# Patient Record
Sex: Female | Born: 1990 | Race: White | Hispanic: No | Marital: Married | Smoking: Never smoker
Health system: Southern US, Community
[De-identification: ages and names within clinical notes are randomized; demographics above are authoritative.]

## PROBLEM LIST (undated history)

## (undated) ENCOUNTER — Inpatient Hospital Stay (HOSPITAL_COMMUNITY): Payer: Self-pay

## (undated) DIAGNOSIS — Z87442 Personal history of urinary calculi: Secondary | ICD-10-CM

## (undated) DIAGNOSIS — R5383 Other fatigue: Secondary | ICD-10-CM

## (undated) DIAGNOSIS — L709 Acne, unspecified: Secondary | ICD-10-CM

## (undated) DIAGNOSIS — R635 Abnormal weight gain: Secondary | ICD-10-CM

## (undated) DIAGNOSIS — Z7689 Persons encountering health services in other specified circumstances: Secondary | ICD-10-CM

## (undated) DIAGNOSIS — R519 Headache, unspecified: Secondary | ICD-10-CM

## (undated) DIAGNOSIS — F419 Anxiety disorder, unspecified: Secondary | ICD-10-CM

## (undated) DIAGNOSIS — N390 Urinary tract infection, site not specified: Secondary | ICD-10-CM

## (undated) HISTORY — DX: Persons encountering health services in other specified circumstances: Z76.89

## (undated) HISTORY — DX: Other fatigue: R53.83

## (undated) HISTORY — DX: Acne, unspecified: L70.9

## (undated) HISTORY — PX: NO PAST SURGERIES: SHX2092

## (undated) HISTORY — DX: Anxiety disorder, unspecified: F41.9

## (undated) HISTORY — DX: Abnormal weight gain: R63.5

---

## 2013-04-10 ENCOUNTER — Ambulatory Visit (INDEPENDENT_AMBULATORY_CARE_PROVIDER_SITE_OTHER): Payer: 59 | Admitting: Adult Health

## 2013-04-10 ENCOUNTER — Encounter: Payer: Self-pay | Admitting: Adult Health

## 2013-04-10 ENCOUNTER — Encounter (INDEPENDENT_AMBULATORY_CARE_PROVIDER_SITE_OTHER): Payer: Self-pay

## 2013-04-10 VITALS — BP 100/60 | Ht 63.0 in | Wt 111.0 lb

## 2013-04-10 DIAGNOSIS — Z3202 Encounter for pregnancy test, result negative: Secondary | ICD-10-CM

## 2013-04-10 DIAGNOSIS — Z7689 Persons encountering health services in other specified circumstances: Secondary | ICD-10-CM

## 2013-04-10 DIAGNOSIS — Z3200 Encounter for pregnancy test, result unknown: Secondary | ICD-10-CM

## 2013-04-10 HISTORY — DX: Persons encountering health services in other specified circumstances: Z76.89

## 2013-04-10 LAB — POCT URINE PREGNANCY: Preg Test, Ur: NEGATIVE

## 2013-04-10 MED ORDER — NORETHIN ACE-ETH ESTRAD-FE 1-20 MG-MCG(24) PO CHEW: 1.0000 | CHEWABLE_TABLET | Freq: Every day | ORAL | Status: DC

## 2013-04-10 NOTE — Progress Notes (Signed)
Subjective:     Patient ID: Zoe Armstrong, female   DOB: 01-19-1991, 22 y.o.   MRN: [id number]  HPI Zoe Armstrong is a 22 year old white female single,senior at [employer], here to get on birth control pills to help with acne, but also to control periods.She bleeds up to 7 days with the first 3-4 heavy,changing pads every 2 hours,and has headaches with her periods.She started at about age 22, she has never had sex, and does not use tampons.She said she has migraines without aura, but takes no meds at present.  Review of Systems Positives in HPI Reviewed past medical,surgical, social and family history. Reviewed medications and allergies.     Objective:   Physical Exam BP 100/60  Ht 5\' 3"  (1.6 m)  Wt 111 lb (50.349 kg)  BMI 19.67 kg/m2  LMP 03/30/2013   Urine pregnancy test negative,  Skin warm and dry. Neck: mid line trachea, normal thyroid. Lungs: clear to ausculation bilaterally. Cardiovascular: regular rate and rhythm. Assessment:     Period management    Plan:    Will start minastrin with next period, Number of samples 4  Lot number [id number]    Exp date 2/16 Follow up in 3 months Review handout on contraception Call prn problems or use my chart to communicate

## 2013-04-10 NOTE — Patient Instructions (Signed)
Contraception Choices  Contraception (birth control) is the use of any methods or devices to prevent pregnancy. Below are some methods to help avoid pregnancy.  HORMONAL METHODS   · Contraceptive implant. This is a thin, plastic tube containing progesterone hormone. It does not contain estrogen hormone. Your caregiver inserts the tube in the inner part of the upper arm. The tube can remain in place for up to 3 years. After 3 years, the implant must be removed. The implant prevents the ovaries from releasing an egg (ovulation), thickens the cervical mucus which prevents sperm from entering the uterus, and thins the lining of the inside of the uterus.  · Progesterone-only injections. These injections are given every 3 months by your caregiver to prevent pregnancy. This synthetic progesterone hormone stops the ovaries from releasing eggs. It also thickens cervical mucus and changes the uterine lining. This makes it harder for sperm to survive in the uterus.  · Birth control pills. These pills contain estrogen and progesterone hormone. They work by stopping the egg from forming in the ovary (ovulation). Birth control pills are prescribed by a caregiver. Birth control pills can also be used to treat heavy periods.  · Minipill. This type of birth control pill contains only the progesterone hormone. They are taken every day of each month and must be prescribed by your caregiver.  · Birth control patch. The patch contains hormones similar to those in birth control pills. It must be changed once a week and is prescribed by a caregiver.  · Vaginal ring. The ring contains hormones similar to those in birth control pills. It is left in the vagina for 3 weeks, removed for 1 week, and then a new one is put back in place. The patient must be comfortable inserting and removing the ring from the vagina. A caregiver's prescription is necessary.  · Emergency contraception. Emergency contraceptives prevent pregnancy after unprotected  sexual intercourse. This pill can be taken right after sex or up to 5 days after unprotected sex. It is most effective the sooner you take the pills after having sexual intercourse. Emergency contraceptive pills are available without a prescription. Check with your pharmacist. Do not use emergency contraception as your only form of birth control.  BARRIER METHODS   · Female condom. This is a thin sheath (latex or rubber) that is worn over the penis during sexual intercourse. It can be used with spermicide to increase effectiveness.  · Female condom. This is a soft, loose-fitting sheath that is put into the vagina before sexual intercourse.  · Diaphragm. This is a soft, latex, dome-shaped barrier that must be fitted by a caregiver. It is inserted into the vagina, along with a spermicidal jelly. It is inserted before intercourse. The diaphragm should be left in the vagina for 6 to 8 hours after intercourse.  · Cervical cap. This is a round, soft, latex or plastic cup that fits over the cervix and must be fitted by a caregiver. The cap can be left in place for up to 48 hours after intercourse.  · Sponge. This is a soft, circular piece of polyurethane foam. The sponge has spermicide in it. It is inserted into the vagina after wetting it and before sexual intercourse.  · Spermicides. These are chemicals that kill or block sperm from entering the cervix and uterus. They come in the form of creams, jellies, suppositories, foam, or tablets. They do not require a prescription. They are inserted into the vagina with an applicator before having sexual intercourse.   IUD). This is a T-shaped device that is put in a woman's uterus during a menstrual period to prevent pregnancy. There are 2 types:  Copper IUD. This type of IUD is wrapped in copper wire and is placed inside the uterus. Copper makes the uterus and  fallopian tubes produce a fluid that kills sperm. It can stay in place for 10 years.  Hormone IUD. This type of IUD contains the hormone progestin (synthetic progesterone). The hormone thickens the cervical mucus and prevents sperm from entering the uterus, and it also thins the uterine lining to prevent implantation of a fertilized egg. The hormone can weaken or kill the sperm that get into the uterus. It can stay in place for 5 years. PERMANENT METHODS OF CONTRACEPTION  Female tubal ligation. This is when the woman's fallopian tubes are surgically sealed, tied, or blocked to prevent the egg from traveling to the uterus.  Female sterilization. This is when the female has the tubes that carry sperm tied off (vasectomy).This blocks sperm from entering the vagina during sexual intercourse. After the procedure, the man can still ejaculate fluid (semen). NATURAL PLANNING METHODS  Natural family planning. This is not having sexual intercourse or using a barrier method (condom, diaphragm, cervical cap) on days the woman could become pregnant.  Calendar method. This is keeping track of the length of each menstrual cycle and identifying when you are fertile.  Ovulation method. This is avoiding sexual intercourse during ovulation.  Symptothermal method. This is avoiding sexual intercourse during ovulation, using a thermometer and ovulation symptoms.  Post-ovulation method. This is timing sexual intercourse after you have ovulated. Regardless of which type or method of contraception you choose, it is important that you use condoms to protect against the transmission of sexually transmitted diseases (STDs). Talk with your caregiver about which form of contraception is most appropriate for you. Document Released: 06/15/2005 Document Revised: 09/07/2011 Document Reviewed: 10/22/2010 [facility] Patient Information 2014 [place], [place]. Start minastrin with next period Follow up in 3 months

## 2013-07-11 ENCOUNTER — Ambulatory Visit (INDEPENDENT_AMBULATORY_CARE_PROVIDER_SITE_OTHER): Payer: 59 | Admitting: Adult Health

## 2013-07-11 ENCOUNTER — Encounter (INDEPENDENT_AMBULATORY_CARE_PROVIDER_SITE_OTHER): Payer: Self-pay

## 2013-07-11 ENCOUNTER — Encounter: Payer: Self-pay | Admitting: Adult Health

## 2013-07-11 VITALS — BP 100/56 | Ht 63.0 in | Wt 121.0 lb

## 2013-07-11 DIAGNOSIS — Z7689 Persons encountering health services in other specified circumstances: Secondary | ICD-10-CM

## 2013-07-11 MED ORDER — NORETHIN ACE-ETH ESTRAD-FE 1-20 MG-MCG(24) PO CHEW: 1.0000 | CHEWABLE_TABLET | Freq: Every day | ORAL | Status: DC

## 2013-07-11 NOTE — Patient Instructions (Signed)
Continue OCs Follow up in 1 year 

## 2013-07-11 NOTE — Progress Notes (Signed)
Subjective:     Patient ID: Zoe Armstrong, female   DOB: 11/19/1990, 23 y.o.   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  Review of Systems See HPI Reviewed past medical,surgical, social and family history. Reviewed medications and allergies.     Objective:   Physical Exam BP 100/56  Ht 5\' 3"  (1.6 m)  Wt 121 lb (54.885 kg)  BMI 21.44 kg/m2  LMP 07/03/2013   Discussed the pill and how she is doing and she wants to continue the Minastrin, if acne no better see dermatologist Assessment:     Period management    Plan:     Refilled minastrin x 1 year Follow up in 1 year  Call prn questions or problems

## 2013-11-07 ENCOUNTER — Telehealth: Payer: Self-pay | Admitting: *Deleted

## 2013-11-07 MED ORDER — NORETHIN ACE-ETH ESTRAD-FE 1-20 MG-MCG(24) PO CHEW: 1.0000 | CHEWABLE_TABLET | Freq: Every day | ORAL | Status: DC

## 2013-11-07 NOTE — Telephone Encounter (Signed)
Zoe Armstrong  Gave 1 sample of minastrin to pt

## 2013-11-07 NOTE — Telephone Encounter (Signed)
Pt states went to pick up her refill for OCP and was told cost $100.00 or she could get filled thru mail order cheaper. Pt states has no OCP at all. Per Cyril MourningJennifer Griffin, NP ok to give 1 box sample of Minastrin 24 Fe until pt can get RX filled through mail order. Pt encouraged to have [facility] transfer script for mail order due to limited samples of OCP. Pt verbalized understanding.

## 2013-11-09 ENCOUNTER — Telehealth: Payer: Self-pay | Admitting: Adult Health

## 2013-11-09 MED ORDER — NORETHIN ACE-ETH ESTRAD-FE 1-20 MG-MCG(24) PO CHEW: 1.0000 | CHEWABLE_TABLET | Freq: Every day | ORAL | Status: DC

## 2013-11-09 NOTE — Telephone Encounter (Signed)
Pt requesting a refill for Minastrin Fe with refills to [facility], no longer able to get filled at [facility] due to cost.

## 2013-11-09 NOTE — Telephone Encounter (Signed)
rx sent to [facility]

## 2014-02-23 ENCOUNTER — Ambulatory Visit: Payer: 59 | Admitting: Adult Health

## 2014-02-28 ENCOUNTER — Encounter: Payer: Self-pay | Admitting: Adult Health

## 2014-02-28 ENCOUNTER — Ambulatory Visit (INDEPENDENT_AMBULATORY_CARE_PROVIDER_SITE_OTHER): Payer: 59 | Admitting: Adult Health

## 2014-02-28 VITALS — BP 120/90 | Ht 63.0 in | Wt 117.0 lb

## 2014-02-28 DIAGNOSIS — Z7689 Persons encountering health services in other specified circumstances: Secondary | ICD-10-CM

## 2014-02-28 LAB — POCT URINE PREGNANCY: PREG TEST UR: NEGATIVE

## 2014-02-28 MED ORDER — NORETHIN ACE-ETH ESTRAD-FE 1-20 MG-MCG(24) PO CHEW: 1.0000 | CHEWABLE_TABLET | Freq: Every day | ORAL | Status: DC

## 2014-02-28 NOTE — Patient Instructions (Signed)
Continue OC Follow up in 1 year

## 2014-02-28 NOTE — Progress Notes (Signed)
Subjective:     Patient ID: Zoe Armstrong, female   DOB: 03/21/1991, 23 y.o.   MRN: [id number]  HPI Zuriah is a 23 year old white female in to discuss OC.[facility]  Review of Systems See HPI Reviewed past medical,surgical, social and family history. Reviewed medications and allergies.     Objective:   Physical Exam BP 120/90  Ht  (1.6 m)  Wt 117 lb (53.071 kg)  BMI 20.73 kg/m2  LMP 08/06/2015UPT negative,she missed pills up in July nad  Last period lasted 2 weeks, will continue this pill    Assessment:     Period management    Plan:     Refilled mionastrin x 1 year  Return in 1 year for physical

## 2014-07-25 ENCOUNTER — Encounter: Payer: Self-pay | Admitting: Adult Health

## 2014-07-25 ENCOUNTER — Ambulatory Visit (INDEPENDENT_AMBULATORY_CARE_PROVIDER_SITE_OTHER): Payer: BC Managed Care – PPO | Admitting: Adult Health

## 2014-07-25 VITALS — BP 108/78 | Ht 63.0 in | Wt 119.0 lb

## 2014-07-25 DIAGNOSIS — F419 Anxiety disorder, unspecified: Secondary | ICD-10-CM

## 2014-07-25 HISTORY — DX: Anxiety disorder, unspecified: F41.9

## 2014-07-25 MED ORDER — SERTRALINE HCL 25 MG PO TABS: 25.0000 mg | ORAL_TABLET | Freq: Every day | ORAL | Status: DC

## 2014-07-25 NOTE — Patient Instructions (Signed)
Generalized Anxiety Disorder Generalized anxiety disorder (GAD) is a mental disorder. It interferes with life functions, including relationships, work, and school. GAD is different from normal anxiety, which everyone experiences at some point in their lives in response to specific life events and activities. Normal anxiety actually helps us prepare for and get through these life events and activities. Normal anxiety goes away after the event or activity is over.  GAD causes anxiety that is not necessarily related to specific events or activities. It also causes excess anxiety in proportion to specific events or activities. The anxiety associated with GAD is also difficult to control. GAD can vary from mild to severe. People with severe GAD can have intense waves of anxiety with physical symptoms (panic attacks).  SYMPTOMS The anxiety and worry associated with GAD are difficult to control. This anxiety and worry are related to many life events and activities and also occur more days than not for 6 months or longer. People with GAD also have three or more of the following symptoms (one or more in children):  Restlessness.   Fatigue.  Difficulty concentrating.   Irritability.  Muscle tension.  Difficulty sleeping or unsatisfying sleep. DIAGNOSIS GAD is diagnosed through an assessment by your health care provider. Your health care provider will ask you questions aboutyour mood,physical symptoms, and events in your life. Your health care provider may ask you about your medical history and use of alcohol or drugs, including prescription medicines. Your health care provider may also do a physical exam and blood tests. Certain medical conditions and the use of certain substances can cause symptoms similar to those associated with GAD. Your health care provider may refer you to a mental health specialist for further evaluation. TREATMENT The following therapies are usually used to treat GAD:    Medication. Antidepressant medication usually is prescribed for long-term daily control. Antianxiety medicines may be added in severe cases, especially when panic attacks occur.   Talk therapy (psychotherapy). Certain types of talk therapy can be helpful in treating GAD by providing support, education, and guidance. A form of talk therapy called cognitive behavioral therapy can teach you healthy ways to think about and react to daily life events and activities.  Stress managementtechniques. These include yoga, meditation, and exercise and can be very helpful when they are practiced regularly. A mental health specialist can help determine which treatment is best for you. Some people see improvement with one therapy. However, other people require a combination of therapies. Document Released: 10/10/2012 Document Revised: 10/30/2013 Document Reviewed: 10/10/2012 [facility] Patient Information 2015 [place], [place]. This information is not intended to replace advice given to you by your health care provider. Make sure you discuss any questions you have with your health care provider. Take zoloft 1 daily Return in 4 weeks to see me

## 2014-07-25 NOTE — Progress Notes (Signed)
Subjective:     Patient ID: Zoe Armstrong, female   DOB: 12-Oct-1990, 24 y.o.   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  Review of Systems See HPI Reviewed past medical,surgical, social and family history. Reviewed medications and allergies.     Objective:   Physical Exam BP 108/78 mmHg  Ht 5\' 3"  (1.6 m)  Wt 119 lb (53.978 kg)  BMI 21.09 kg/m2  LMP 07/12/2014   Discussed trying medication and she agrees.And talked about taking time for self.  Assessment:    Anxiety    Plan:     Rx Zoloft 25 mg #30 1 daily with 3 refills Return in 4 weeks, if not better can increase Zoloft to 50 mg Review handout on anxiety

## 2014-08-22 ENCOUNTER — Ambulatory Visit (INDEPENDENT_AMBULATORY_CARE_PROVIDER_SITE_OTHER): Payer: BC Managed Care – PPO | Admitting: Adult Health

## 2014-08-22 ENCOUNTER — Encounter: Payer: Self-pay | Admitting: Adult Health

## 2014-08-22 VITALS — BP 102/62 | HR 65 | Ht 63.0 in | Wt 118.0 lb

## 2014-08-22 DIAGNOSIS — F419 Anxiety disorder, unspecified: Secondary | ICD-10-CM

## 2014-08-22 MED ORDER — SERTRALINE HCL 25 MG PO TABS: 25.0000 mg | ORAL_TABLET | Freq: Every day | ORAL | Status: DC

## 2014-08-22 NOTE — Patient Instructions (Signed)
Continue zoloft  Follow up in 6 months

## 2014-08-22 NOTE — Progress Notes (Signed)
Subjective:     Patient ID: Zoe Armstrong, female   DOB: [birth year], 24 y.o.   MRN: [id number]  HPI Zoe Armstrong is a 24 year old white female back in for follow up of starting zoloft for anxiety and she says she is better.Not crying as much.  Review of Systems  + anxiety,but it is better  Reviewed past medical,surgical, social and family history. Reviewed medications and allergies.     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    Assessment:     Anxiety     Plan:     Refilled zoloft 50 mg #30 1 daily with 6 refills Follow up in 6 months Call before if needed

## 2014-09-11 ENCOUNTER — Telehealth: Payer: Self-pay | Admitting: Adult Health

## 2014-09-11 MED ORDER — NORETHIN ACE-ETH ESTRAD-FE 1-20 MG-MCG(24) PO CHEW: 1.0000 | CHEWABLE_TABLET | Freq: Every day | ORAL | Status: DC

## 2014-09-11 NOTE — Telephone Encounter (Signed)
Refilled minastrin x 6

## 2014-09-18 ENCOUNTER — Telehealth: Payer: Self-pay | Admitting: Adult Health

## 2014-09-18 MED ORDER — NORETHIN ACE-ETH ESTRAD-FE 1-20 MG-MCG(24) PO CHEW: 1.0000 | CHEWABLE_TABLET | Freq: Every day | ORAL | Status: DC

## 2014-09-18 NOTE — Telephone Encounter (Signed)
Needs refill on OCs [facility]

## 2014-12-20 ENCOUNTER — Other Ambulatory Visit: Payer: Self-pay | Admitting: *Deleted

## 2014-12-20 MED ORDER — SERTRALINE HCL 25 MG PO TABS: 25.0000 mg | ORAL_TABLET | Freq: Every day | ORAL | Status: DC

## 2014-12-20 MED ORDER — NORETHIN ACE-ETH ESTRAD-FE 1-20 MG-MCG(24) PO CHEW: 1.0000 | CHEWABLE_TABLET | Freq: Every day | ORAL | Status: DC

## 2014-12-25 ENCOUNTER — Telehealth: Payer: Self-pay | Admitting: Adult Health

## 2014-12-25 NOTE — Telephone Encounter (Signed)
Spoke with pt. Pt states her Zoloft requires prior auth. I advised to have pharmacy send us the paper with the phone number that we need to call. Pt voiced understanding. JSY

## 2015-09-04 ENCOUNTER — Telehealth: Payer: Self-pay | Admitting: *Deleted

## 2015-09-05 ENCOUNTER — Telehealth: Payer: Self-pay | Admitting: Adult Health

## 2015-09-05 MED ORDER — SERTRALINE HCL 25 MG PO TABS: 25.0000 mg | ORAL_TABLET | Freq: Every day | ORAL | Status: DC

## 2015-09-05 NOTE — Telephone Encounter (Signed)
Refilled zoloft

## 2015-09-05 NOTE — Telephone Encounter (Signed)
Left message to call me back in am  

## 2015-09-05 NOTE — Telephone Encounter (Signed)
Spoke with pt. Pt has switched pharmacies and needs birth control and Sertraline, 90 day supply, sent to new pharmacy. Thanks!! JSY

## 2015-09-05 NOTE — Telephone Encounter (Signed)
Pt wanted to let Zoe Armstrong now that she has switched pharmacy, please contact pt

## 2015-09-06 MED ORDER — NORETHIN ACE-ETH ESTRAD-FE 1-20 MG-MCG(24) PO CHEW: 1.0000 | CHEWABLE_TABLET | Freq: Every day | ORAL | Status: DC

## 2015-09-06 NOTE — Addendum Note (Signed)
Addended by: Cyril MourningGRIFFIN, JENNIFER A on: 09/06/2015 01:28 PM   Modules accepted: Orders

## 2015-09-06 NOTE — Telephone Encounter (Signed)
Refilled zoloft and C OCs

## 2015-12-19 ENCOUNTER — Other Ambulatory Visit: Payer: Self-pay | Admitting: *Deleted

## 2015-12-19 NOTE — Telephone Encounter (Signed)
[phone] voice mail not set up @ 12:41 pm. JSY

## 2015-12-20 MED ORDER — NORETHIN ACE-ETH ESTRAD-FE 1-20 MG-MCG(24) PO CHEW: 1.0000 | CHEWABLE_TABLET | Freq: Every day | ORAL | Status: DC

## 2015-12-24 ENCOUNTER — Other Ambulatory Visit (HOSPITAL_COMMUNITY)
Admission: RE | Admit: 2015-12-24 | Discharge: 2015-12-24 | Disposition: A | Discharge status: Home / Self Care | Payer: BC Managed Care – PPO | Source: Ambulatory Visit | Attending: Obstetrics & Gynecology | Admitting: Obstetrics & Gynecology

## 2015-12-24 ENCOUNTER — Ambulatory Visit (INDEPENDENT_AMBULATORY_CARE_PROVIDER_SITE_OTHER): Payer: BC Managed Care – PPO | Admitting: Adult Health

## 2015-12-24 ENCOUNTER — Encounter: Payer: Self-pay | Admitting: Adult Health

## 2015-12-24 VITALS — BP 100/70 | HR 68 | Ht 63.0 in | Wt 134.5 lb

## 2015-12-24 DIAGNOSIS — Z01419 Encounter for gynecological examination (general) (routine) without abnormal findings: Secondary | ICD-10-CM

## 2015-12-24 DIAGNOSIS — Z01411 Encounter for gynecological examination (general) (routine) with abnormal findings: Secondary | ICD-10-CM | POA: Diagnosis not present

## 2015-12-24 DIAGNOSIS — Z7689 Persons encountering health services in other specified circumstances: Secondary | ICD-10-CM

## 2015-12-24 DIAGNOSIS — F419 Anxiety disorder, unspecified: Secondary | ICD-10-CM

## 2015-12-24 DIAGNOSIS — R635 Abnormal weight gain: Secondary | ICD-10-CM | POA: Diagnosis not present

## 2015-12-24 DIAGNOSIS — Z308 Encounter for other contraceptive management: Secondary | ICD-10-CM

## 2015-12-24 DIAGNOSIS — R5383 Other fatigue: Secondary | ICD-10-CM

## 2015-12-24 HISTORY — DX: Other fatigue: R53.83

## 2015-12-24 HISTORY — DX: Abnormal weight gain: R63.5

## 2015-12-24 HISTORY — DX: Persons encountering health services in other specified circumstances: Z76.89

## 2015-12-24 MED ORDER — NORETHIN ACE-ETH ESTRAD-FE 1-20 MG-MCG(24) PO CHEW: 1.0000 | CHEWABLE_TABLET | Freq: Every day | ORAL | Status: DC

## 2015-12-24 MED ORDER — SERTRALINE HCL 25 MG PO TABS: 25.0000 mg | ORAL_TABLET | Freq: Every day | ORAL | Status: DC

## 2015-12-24 NOTE — Patient Instructions (Signed)
Physical in 1 year,pap in 3 if normal 

## 2015-12-24 NOTE — Progress Notes (Signed)
Patient ID: Zoe Armstrong, female   DOB: 03/05/1991, 25 y.o.   MRN: [id number] History of Present Illness:  Zoe Armstrong is a 25 year old white female,single,in for a well woman gyn exam and pap.She has had some irregular periods with generic minastrin, and she is complaining of fatigue and weight gain. 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  Current Medications, Allergies, Past Medical History, Past Surgical History, Family History and Social History were reviewed in [facility] electronic medical record.     Review of Systems:  Patient denies any headaches, hearing loss, blurred vision, shortness of breath, chest pain, abdominal pain, problems with bowel movements, urination, or intercourse(not currently having sex). No joint pain or mood swings. See HPI for positives.  Physical Exam:BP 100/70 mmHg  Pulse 68  Ht 5\' 3"  (1.6 m)  Wt 134 lb 8 oz (61.009 kg)  BMI 23.83 kg/m2  LMP 12/20/2015 General:  Well developed, well nourished, no acute distress Skin:  Warm and dry,tan Neck:  Midline trachea, normal thyroid, good ROM, no lymphadenopathy Lungs; Clear to auscultation bilaterally Breast:  No dominant palpable mass, retraction, or nipple discharge Cardiovascular: Regular rate and rhythm Abdomen:  Soft, non tender, no hepatosplenomegaly Pelvic:  External genitalia is normal in appearance, has 1 cm dark mole in mons pubis(I told her to see Dr Orvan FalconerBeavers to have removed).  The vagina is normal in appearance.Has dark brown blood in vault. Urethra has no lesions or masses. The cervix is smooth,pap performed.  Uterus is felt to be normal size, shape, and contour.  No adnexal masses or tenderness noted.Bladder is non tender, no masses felt. Extremities/musculoskeletal:  No swelling or varicosities noted, no clubbing or cyanosis Psych:  No mood changes, alert and cooperative,seems happy   Impression:  Well woman gyn exam and pap Anxiety Period  management Fatigue    Plan: Check CBC,CMP,TSH and free T4,will talk when labs back  Physical in 1 year, pap in 3 if normal Refilled minastrin x 1 year Refilled zoloft 25 mg #90 take 1 daily with 4 refills

## 2015-12-25 ENCOUNTER — Telehealth: Payer: Self-pay | Admitting: Adult Health

## 2015-12-25 LAB — COMPREHENSIVE METABOLIC PANEL
ALK PHOS: 46 IU/L (ref 39–117)
ALT: 13 IU/L (ref 0–32)
AST: 16 IU/L (ref 0–40)
Albumin/Globulin Ratio: 1.8 (ref 1.2–2.2)
Albumin: 4.5 g/dL (ref 3.5–5.5)
BUN/Creatinine Ratio: 21 (ref 9–23)
BUN: 13 mg/dL (ref 6–20)
Bilirubin Total: 0.2 mg/dL (ref 0.0–1.2)
CO2: 22 mmol/L (ref 18–29)
CREATININE: 0.63 mg/dL (ref 0.57–1.00)
Calcium: 9.4 mg/dL (ref 8.7–10.2)
Chloride: 103 mmol/L (ref 96–106)
GFR calc Af Amer: 143 mL/min/{1.73_m2} (ref >59)
GFR, EST NON AFRICAN AMERICAN: 124 mL/min/{1.73_m2} (ref >59)
GLOBULIN, TOTAL: 2.5 g/dL (ref 1.5–4.5)
Glucose: 55 mg/dL — ABNORMAL LOW (ref 65–99)
POTASSIUM: 4.2 mmol/L (ref 3.5–5.2)
Sodium: 140 mmol/L (ref 134–144)
Total Protein: 7 g/dL (ref 6.0–8.5)

## 2015-12-25 LAB — CBC
Hematocrit: 40.6 % (ref 34.0–46.6)
Hemoglobin: 13.4 g/dL (ref 11.1–15.9)
MCH: 28.3 pg (ref 26.6–33.0)
MCHC: 33 g/dL (ref 31.5–35.7)
MCV: 86 fL (ref 79–97)
Platelets: 309 10*3/uL (ref 150–379)
RBC: 4.73 x10E6/uL (ref 3.77–5.28)
RDW: 13.7 % (ref 12.3–15.4)
WBC: 6.8 10*3/uL (ref 3.4–10.8)

## 2015-12-25 LAB — T4, FREE: Free T4: 1.18 ng/dL (ref 0.82–1.77)

## 2015-12-25 LAB — TSH: TSH: 2.59 u[IU]/mL (ref 0.450–4.500)

## 2015-12-25 NOTE — Telephone Encounter (Signed)
Pt aware of labs, try to eat every 2-3 hours with some protein

## 2015-12-26 LAB — CYTOLOGY - PAP

## 2016-12-28 ENCOUNTER — Encounter: Payer: Self-pay | Admitting: Adult Health

## 2016-12-28 ENCOUNTER — Ambulatory Visit (INDEPENDENT_AMBULATORY_CARE_PROVIDER_SITE_OTHER): Payer: BC Managed Care – PPO | Admitting: Adult Health

## 2016-12-28 VITALS — BP 112/72 | HR 76 | Ht 63.0 in | Wt 135.5 lb

## 2016-12-28 DIAGNOSIS — Z7689 Persons encountering health services in other specified circumstances: Secondary | ICD-10-CM

## 2016-12-28 DIAGNOSIS — Z01419 Encounter for gynecological examination (general) (routine) without abnormal findings: Secondary | ICD-10-CM

## 2016-12-28 MED ORDER — NORETHIN ACE-ETH ESTRAD-FE 1-20 MG-MCG(24) PO CHEW: 1.0000 | CHEWABLE_TABLET | Freq: Every day | ORAL | 12 refills | Status: DC

## 2016-12-28 NOTE — Progress Notes (Signed)
Patient ID: Zoe Armstrong, female   DOB: 05-Aug-1990, 26 y.o.   MRN: [id number] History of Present Illness: [name] is a 26 year old white female, G0P0, getting married in May, in for well woman gyn exam, she had normal pap 12/24/15.She weaned off her Zoloft and is fine.She says periods good on OCs.    Current Medications, Allergies, Past Medical History, Past Surgical History, Family History and Social History were reviewed in [facility] electronic medical record.     Review of Systems:  Patient denies any headaches, hearing loss, fatigue, blurred vision, shortness of breath, chest pain, abdominal pain, problems with bowel movements, urination, or intercourse(not having sex). No joint pain or mood swings.Periods good on OCs.   Physical Exam:BP 112/72 (BP Location: Left Arm, Patient Position: Sitting, Cuff Size: Normal)   Pulse 76   Ht 5\' 3"  (1.6 m)   Wt 135 lb 8 oz (61.5 kg)   LMP 12/18/2016 (Exact Date)   BMI 24.00 kg/m  General:  Well developed, well nourished, no acute distress Skin:  Warm and dry,tan, has mole on back that is dark Neck:  Midline trachea, normal thyroid, good ROM, no lymphadenopathy Lungs; Clear to auscultation bilaterally Breast:  No dominant palpable mass, retraction, or nipple discharge Cardiovascular: Regular rate and rhythm Abdomen:  Soft, non tender, no hepatosplenomegaly Pelvic:  External genitalia is normal in appearance, has mole on right side mons pubis, that is slightly elevated and irregular in color, and bigger than pencil eraser.  The vagina is normal in appearance. Urethra has no lesions or masses. The cervix is nulliparous.  Uterus is felt to be normal size, shape, and contour.  No adnexal masses or tenderness noted.Bladder is non tender, no masses felt. Extremities/musculoskeletal:  No swelling or varicosities noted, no clubbing or cyanosis Psych:  No mood changes, alert and cooperative,seems happy PHQ 2 score 0.  Impression: 1. Well woman exam with  routine gynecological exam   2. Encounter for menstrual regulation       Plan: Meds ordered this encounter  Medications  . Norethin Ace-Eth Estrad-FE (MINASTRIN 24 FE) 1-20 MG-MCG(24) CHEW    Sig: Chew 1 tablet by mouth daily.    Dispense:  30 tablet    Refill:  12    Order Specific Question:   Supervising Provider    Answer:   Lazaro ArmsEURE, LUTHER H [[id number]]  Physical in 1 year Pap in 2020 See dermatologist about moles

## 2017-03-24 ENCOUNTER — Encounter: Payer: Self-pay | Admitting: Adult Health

## 2017-03-24 ENCOUNTER — Ambulatory Visit (INDEPENDENT_AMBULATORY_CARE_PROVIDER_SITE_OTHER): Payer: BC Managed Care – PPO | Admitting: Adult Health

## 2017-03-24 VITALS — BP 110/70 | HR 68 | Ht 63.0 in | Wt 131.5 lb

## 2017-03-24 DIAGNOSIS — Z113 Encounter for screening for infections with a predominantly sexual mode of transmission: Secondary | ICD-10-CM

## 2017-03-24 NOTE — Patient Instructions (Signed)
Will talk when labs back  

## 2017-03-24 NOTE — Progress Notes (Signed)
Subjective:     Patient ID: Nadara Mode, female   DOB: 10/05/1990, 26 y.o.   MRN: [id number]  HPI Donnajean is a 26 year old white female in for STD screening just for her peace of mind.Getting married in May.   Review of Systems No discharge Not currently having sex    Reviewed past medical,surgical, social and family history. Reviewed medications and allergies.  Objective:   Physical Exam BP 110/70 (BP Location: Left Arm, Patient Position: Sitting, Cuff Size: Normal)   Pulse 68   Ht  (1.6 m)   Wt 131 lb 8 oz (59.6 kg)   LMP 03/24/2017   BMI 23.29 kg/m  Skin warm and dry.Pelvic: external genitalia is normal in appearance no lesions, vagina: period blood,urethra has no lesions or masses noted, cervix:smooth. GC/CHL obtained.     Assessment:     1. Screening examination for STD (sexually transmitted disease)       Plan:     GC/CHL sent Check HIV and RPR Follow up prn  Will talk when labs back

## 2017-03-25 LAB — HIV ANTIBODY (ROUTINE TESTING W REFLEX): HIV SCREEN 4TH GENERATION: NONREACTIVE

## 2017-03-25 LAB — RPR: RPR Ser Ql: NONREACTIVE

## 2017-03-26 ENCOUNTER — Telehealth: Payer: Self-pay | Admitting: Adult Health

## 2017-03-26 LAB — GC/CHLAMYDIA PROBE AMP
Chlamydia trachomatis, NAA: NEGATIVE
NEISSERIA GONORRHOEAE BY PCR: NEGATIVE

## 2017-03-26 NOTE — Telephone Encounter (Signed)
Pt aware labs negative.  

## 2017-12-29 ENCOUNTER — Other Ambulatory Visit: Payer: Self-pay | Admitting: Adult Health

## 2018-03-03 ENCOUNTER — Encounter: Payer: Self-pay | Admitting: Adult Health

## 2018-03-03 ENCOUNTER — Ambulatory Visit: Payer: BC Managed Care – PPO | Admitting: Adult Health

## 2018-03-03 VITALS — BP 135/82 | HR 77 | Ht 63.0 in | Wt 135.0 lb

## 2018-03-03 DIAGNOSIS — Z01419 Encounter for gynecological examination (general) (routine) without abnormal findings: Secondary | ICD-10-CM

## 2018-03-03 DIAGNOSIS — R319 Hematuria, unspecified: Secondary | ICD-10-CM

## 2018-03-03 DIAGNOSIS — R350 Frequency of micturition: Secondary | ICD-10-CM

## 2018-03-03 DIAGNOSIS — Z01411 Encounter for gynecological examination (general) (routine) with abnormal findings: Secondary | ICD-10-CM | POA: Diagnosis not present

## 2018-03-03 DIAGNOSIS — N390 Urinary tract infection, site not specified: Secondary | ICD-10-CM

## 2018-03-03 DIAGNOSIS — Z3041 Encounter for surveillance of contraceptive pills: Secondary | ICD-10-CM

## 2018-03-03 LAB — POCT URINALYSIS DIPSTICK OB
Glucose, UA: NEGATIVE
Ketones, UA: NEGATIVE
Leukocytes, UA: NEGATIVE
Nitrite, UA: POSITIVE
POC,PROTEIN,UA: NEGATIVE

## 2018-03-03 MED ORDER — NORETHIN ACE-ETH ESTRAD-FE 1-20 MG-MCG(24) PO CHEW: CHEWABLE_TABLET | ORAL | 4 refills | Status: DC

## 2018-03-03 MED ORDER — PHENAZOPYRIDINE HCL 200 MG PO TABS: 200.0000 mg | ORAL_TABLET | Freq: Three times a day (TID) | ORAL | 0 refills | Status: DC | PRN

## 2018-03-03 MED ORDER — SULFAMETHOXAZOLE-TRIMETHOPRIM 800-160 MG PO TABS: 1.0000 | ORAL_TABLET | Freq: Two times a day (BID) | ORAL | 0 refills | Status: DC

## 2018-03-03 NOTE — Progress Notes (Signed)
Patient ID: Zoe Armstrong, female   DOB: 11-09-1990, 27 y.o.   MRN: [id number] History of Present Illness: Zoe Armstrong is a 27 year old white female, recently married, G0P0, in for well woman gyn exam,she had normal pap 12/24/15. 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 PCP is Dr Dimas Aguas.   Current Medications, Allergies, Past Medical History, Past Surgical History, Family History and Social History were reviewed in [employer] record.     Review of Systems: Patient denies any headaches, hearing loss, fatigue, blurred vision, shortness of breath, chest pain, abdominal pain, problems with bowel movements, or intercourse(has occasional discomfort). No joint pain or mood swings. +urinary frequency  She is happy with her OCs.   Physical Exam:BP 135/82 (BP Location: Left Arm, Patient Position: Sitting, Cuff Size: Normal)   Pulse 77   Ht 5\' 3"  (1.6 m)   Wt 135 lb (61.2 kg)   LMP 02/25/2018   BMI 23.91 kg/m Urine dipstick +nitrates and trace blood. General:  Well developed, well nourished, no acute distress Skin:  Warm and dry Neck:  Midline trachea, normal thyroid, good ROM, no lymphadenopathy Lungs; Clear to auscultation bilaterally Breast:  No dominant palpable mass, retraction, or nipple discharge Cardiovascular: Regular rate and rhythm Abdomen:  Soft, non tender, no hepatosplenomegaly Pelvic:  External genitalia is normal in appearance, no lesions.  The vagina is normal in appearance. Urethra has no lesions or masses. The cervix is nulliparous.  Uterus is felt to be normal size, shape, and contour.  No adnexal masses or tenderness noted.Bladder is non tender, no masses felt. Extremities/musculoskeletal:  No swelling or varicosities noted, no clubbing or cyanosis Psych:  No mood changes, alert and cooperative,seems happy PHQ 2 score 0. Examination chaperoned by Malachy Mood LPN.  She is thinking about pregnancy, but not yet, will continue OCs.   Impression: 1.  Encounter for well woman exam with routine gynecological exam   2. Encounter for surveillance of contraceptive pills   3. Urinary frequency   4. Urinary tract infection with hematuria, site unspecified       Plan: UA C&S sent Meds ordered this encounter  Medications  . Norethin Ace-Eth Estrad-FE (MIBELAS 24 FE) 1-20 MG-MCG(24) CHEW    Sig: CHEW ONE TABLET BY MOUTH EVERY DAY    Dispense:  84 tablet    Refill:  4    Order Specific Question:   Supervising Provider    Answer:   Despina Hidden, LUTHER H [[id number]]  . sulfamethoxazole-trimethoprim (BACTRIM DS,SEPTRA DS) 800-160 MG tablet    Sig: Take 1 tablet by mouth 2 (two) times daily. Take 1 bid    Dispense:  14 tablet    Refill:  0    Order Specific Question:   Supervising Provider    Answer:   Despina Hidden, LUTHER H [[id number]]  . phenazopyridine (PYRIDIUM) 200 MG tablet    Sig: Take 1 tablet (200 mg total) by mouth 3 (three) times daily as needed for pain.    Dispense:  10 tablet    Refill:  0    Order Specific Question:   Supervising Provider    Answer:   Lazaro Arms [[id number]]  Physical and pap in 1 year  Will start mammograms in 30's, mom had breast cancer in her 40's.

## 2018-03-04 LAB — URINALYSIS, ROUTINE W REFLEX MICROSCOPIC
BILIRUBIN UA: NEGATIVE
Glucose, UA: NEGATIVE
Ketones, UA: NEGATIVE
Nitrite, UA: POSITIVE — AB
PH UA: 7 (ref 5.0–7.5)
Protein, UA: NEGATIVE
Specific Gravity, UA: 1.011 (ref 1.005–1.030)
Urobilinogen, Ur: 0.2 mg/dL (ref 0.2–1.0)

## 2018-03-04 LAB — MICROSCOPIC EXAMINATION: CASTS: NONE SEEN /LPF

## 2018-03-05 LAB — URINE CULTURE

## 2018-03-07 ENCOUNTER — Telehealth: Payer: Self-pay | Admitting: Adult Health

## 2018-03-07 NOTE — Telephone Encounter (Signed)
Mailbox full

## 2018-05-03 ENCOUNTER — Other Ambulatory Visit (HOSPITAL_COMMUNITY): Payer: Self-pay | Admitting: Family Medicine

## 2018-05-03 DIAGNOSIS — R202 Paresthesia of skin: Secondary | ICD-10-CM

## 2018-05-05 ENCOUNTER — Ambulatory Visit (HOSPITAL_COMMUNITY)
Admission: RE | Admit: 2018-05-05 | Discharge: 2018-05-05 | Disposition: A | Discharge status: Home / Self Care | Payer: BC Managed Care – PPO | Source: Ambulatory Visit | Attending: Family Medicine | Admitting: Family Medicine

## 2018-05-05 ENCOUNTER — Ambulatory Visit (HOSPITAL_COMMUNITY): Payer: BC Managed Care – PPO

## 2018-05-05 DIAGNOSIS — R202 Paresthesia of skin: Secondary | ICD-10-CM | POA: Diagnosis present

## 2018-06-29 NOTE — L&D Delivery Note (Addendum)
Delivery Note At 1:43 PM a viable and healthy female was delivered via Vaginal, Spontaneous (Presentation:  LOA).  APGAR: 7, 8; weight 8 lb 12.2 oz (3975 g).   Placenta status: intact, spontaneously.  Cord: 3-vessel cord,   with the following complications: wrapped around feet bilaterally, + mec stain in amniotic fluid;  Cord pH: pending  Mom on Rocephin for Pyelonephritis, will continue Amp/Gent for chorio, will continue   Anesthesia:  Epidural + 1% lidocaine Episiotomy: None Lacerations: 4th degree Suture Repair: please see procedure note by Dr. Rosana Hoes Est. Blood Loss (mL): 642mL  Mom to postpartum.  Baby to Couplet care / Skin to Skin.  Nikki Dom Driver 3/71/0626, 9:48 PM  Patient is a G1P0 at [redacted]w[redacted]d who was admitted for postdates IOL and pyelonephritis, labor complicated by pyelonephritis.  I was gloved and present for delivery in its entirety.  Second stage of labor progressed, baby delivered after 2 hours of pushing.   Complications: 4th degree laceration repaired by Dr. Jonette Pesa, CNM 4:07 PM

## 2018-07-19 ENCOUNTER — Ambulatory Visit: Payer: BC Managed Care – PPO | Admitting: Adult Health

## 2018-07-19 ENCOUNTER — Encounter: Payer: Self-pay | Admitting: Adult Health

## 2018-07-19 VITALS — BP 120/72 | HR 82 | Ht 63.0 in | Wt 141.0 lb

## 2018-07-19 DIAGNOSIS — Z3A01 Less than 8 weeks gestation of pregnancy: Secondary | ICD-10-CM | POA: Insufficient documentation

## 2018-07-19 DIAGNOSIS — R350 Frequency of micturition: Secondary | ICD-10-CM

## 2018-07-19 DIAGNOSIS — N926 Irregular menstruation, unspecified: Secondary | ICD-10-CM

## 2018-07-19 DIAGNOSIS — O3680X0 Pregnancy with inconclusive fetal viability, not applicable or unspecified: Secondary | ICD-10-CM | POA: Insufficient documentation

## 2018-07-19 DIAGNOSIS — Z3201 Encounter for pregnancy test, result positive: Secondary | ICD-10-CM | POA: Diagnosis not present

## 2018-07-19 DIAGNOSIS — N390 Urinary tract infection, site not specified: Secondary | ICD-10-CM | POA: Insufficient documentation

## 2018-07-19 LAB — POCT URINALYSIS DIPSTICK
GLUCOSE UA: NEGATIVE
Nitrite, UA: NEGATIVE
PROTEIN UA: NEGATIVE

## 2018-07-19 LAB — POCT URINE PREGNANCY: PREG TEST UR: POSITIVE — AB

## 2018-07-19 NOTE — Patient Instructions (Signed)
First Trimester of Pregnancy  The first trimester of pregnancy is from week 1 until the end of week 13 (months 1 through 3). A week after a sperm fertilizes an egg, the egg will implant on the wall of the uterus. This embryo will begin to develop into a baby. Genes from you and your partner will form the baby. The female genes will determine whether the baby will be a boy or a girl. At 6-8 weeks, the eyes and face will be formed, and the heartbeat can be seen on ultrasound. At the end of 12 weeks, all the baby's organs will be formed.  Now that you are pregnant, you will want to do everything you can to have a healthy baby. Two of the most important things are to get good prenatal care and to follow your health care provider's instructions. Prenatal care is all the medical care you receive before the baby's birth. This care will help prevent, find, and treat any problems during the pregnancy and childbirth.  Body changes during your first trimester  Your body goes through many changes during pregnancy. The changes vary from woman to woman.   You may gain or lose a couple of pounds at first.   You may feel sick to your stomach (nauseous) and you may throw up (vomit). If the vomiting is uncontrollable, call your health care provider.   You may tire easily.   You may develop headaches that can be relieved by medicines. All medicines should be approved by your health care provider.   You may urinate more often. Painful urination may mean you have a bladder infection.   You may develop heartburn as a result of your pregnancy.   You may develop constipation because certain hormones are causing the muscles that push stool through your intestines to slow down.   You may develop hemorrhoids or swollen veins (varicose veins).   Your breasts may begin to grow larger and become tender. Your nipples may stick out more, and the tissue that surrounds them (areola) may become darker.   Your gums may bleed and may be  sensitive to brushing and flossing.   Dark spots or blotches (chloasma, mask of pregnancy) may develop on your face. This will likely fade after the baby is born.   Your menstrual periods will stop.   You may have a loss of appetite.   You may develop cravings for certain kinds of food.   You may have changes in your emotions from day to day, such as being excited to be pregnant or being concerned that something may go wrong with the pregnancy and baby.   You may have more vivid and strange dreams.   You may have changes in your hair. These can include thickening of your hair, rapid growth, and changes in texture. Some women also have hair loss during or after pregnancy, or hair that feels dry or thin. Your hair will most likely return to normal after your baby is born.  What to expect at prenatal visits  During a routine prenatal visit:   You will be weighed to make sure you and the baby are growing normally.   Your blood pressure will be taken.   Your abdomen will be measured to track your baby's growth.   The fetal heartbeat will be listened to between weeks 10 and 14 of your pregnancy.   Test results from any previous visits will be discussed.  Your health care provider may ask you:     How you are feeling.   If you are feeling the baby move.   If you have had any abnormal symptoms, such as leaking fluid, bleeding, severe headaches, or abdominal cramping.   If you are using any tobacco products, including cigarettes, chewing tobacco, and electronic cigarettes.   If you have any questions.  Other tests that may be performed during your first trimester include:   Blood tests to find your blood type and to check for the presence of any previous infections. The tests will also be used to check for low iron levels (anemia) and protein on red blood cells (Rh antibodies). Depending on your risk factors, or if you previously had diabetes during pregnancy, you may have tests to check for high blood sugar  that affects pregnant women (gestational diabetes).   Urine tests to check for infections, diabetes, or protein in the urine.   An ultrasound to confirm the proper growth and development of the baby.   Fetal screens for spinal cord problems (spina bifida) and Down syndrome.   HIV (human immunodeficiency virus) testing. Routine prenatal testing includes screening for HIV, unless you choose not to have this test.   You may need other tests to make sure you and the baby are doing well.  Follow these instructions at home:  Medicines   Follow your health care provider's instructions regarding medicine use. Specific medicines may be either safe or unsafe to take during pregnancy.   Take a prenatal vitamin that contains at least 600 micrograms (mcg) of folic acid.   If you develop constipation, try taking a stool softener if your health care provider approves.  Eating and drinking     Eat a balanced diet that includes fresh fruits and vegetables, whole grains, good sources of protein such as meat, eggs, or tofu, and low-fat dairy. Your health care provider will help you determine the amount of weight gain that is right for you.   Avoid raw meat and uncooked cheese. These carry germs that can cause birth defects in the baby.   Eating four or five small meals rather than three large meals a day may help relieve nausea and vomiting. If you start to feel nauseous, eating a few soda crackers can be helpful. Drinking liquids between meals, instead of during meals, also seems to help ease nausea and vomiting.   Limit foods that are high in fat and processed sugars, such as fried and sweet foods.   To prevent constipation:  ? Eat foods that are high in fiber, such as fresh fruits and vegetables, whole grains, and beans.  ? Drink enough fluid to keep your urine clear or pale yellow.  Activity   Exercise only as directed by your health care provider. Most women can continue their usual exercise routine during  pregnancy. Try to exercise for 30 minutes at least 5 days a week. Exercising will help you:  ? Control your weight.  ? Stay in shape.  ? Be prepared for labor and delivery.   Experiencing pain or cramping in the lower abdomen or lower back is a good sign that you should stop exercising. Check with your health care provider before continuing with normal exercises.   Try to avoid standing for long periods of time. Move your legs often if you must stand in one place for a long time.   Avoid heavy lifting.   Wear low-heeled shoes and practice good posture.   You may continue to have sex unless your health care   provider tells you not to.  Relieving pain and discomfort   Wear a good support bra to relieve breast tenderness.   Take warm sitz baths to soothe any pain or discomfort caused by hemorrhoids. Use hemorrhoid cream if your health care provider approves.   Rest with your legs elevated if you have leg cramps or low back pain.   If you develop varicose veins in your legs, wear support hose. Elevate your feet for 15 minutes, 3-4 times a day. Limit salt in your diet.  Prenatal care   Schedule your prenatal visits by the twelfth week of pregnancy. They are usually scheduled monthly at first, then more often in the last 2 months before delivery.   Write down your questions. Take them to your prenatal visits.   Keep all your prenatal visits as told by your health care provider. This is important.  Safety   Wear your seat belt at all times when driving.   Make a list of emergency phone numbers, including numbers for family, friends, the hospital, and police and fire departments.  General instructions   Ask your health care provider for a referral to a local prenatal education class. Begin classes no later than the beginning of month 6 of your pregnancy.   Ask for help if you have counseling or nutritional needs during pregnancy. Your health care provider can offer advice or refer you to specialists for help  with various needs.   Do not use hot tubs, steam rooms, or saunas.   Do not douche or use tampons or scented sanitary pads.   Do not cross your legs for long periods of time.   Avoid cat litter boxes and soil used by cats. These carry germs that can cause birth defects in the baby and possibly loss of the fetus by miscarriage or stillbirth.   Avoid all smoking, herbs, alcohol, and medicines not prescribed by your health care provider. Chemicals in these products affect the formation and growth of the baby.   Do not use any products that contain nicotine or tobacco, such as cigarettes and e-cigarettes. If you need help quitting, ask your health care provider. You may receive counseling support and other resources to help you quit.   Schedule a dentist appointment. At home, brush your teeth with a soft toothbrush and be gentle when you floss.  Contact a health care provider if:   You have dizziness.   You have mild pelvic cramps, pelvic pressure, or nagging pain in the abdominal area.   You have persistent nausea, vomiting, or diarrhea.   You have a bad smelling vaginal discharge.   You have pain when you urinate.   You notice increased swelling in your face, hands, legs, or ankles.   You are exposed to fifth disease or chickenpox.   You are exposed to German measles (rubella) and have never had it.  Get help right away if:   You have a fever.   You are leaking fluid from your vagina.   You have spotting or bleeding from your vagina.   You have severe abdominal cramping or pain.   You have rapid weight gain or loss.   You vomit blood or material that looks like coffee grounds.   You develop a severe headache.   You have shortness of breath.   You have any kind of trauma, such as from a fall or a car accident.  Summary   The first trimester of pregnancy is from week 1 until   the end of week 13 (months 1 through 3).   Your body goes through many changes during pregnancy. The changes vary from  woman to woman.   You will have routine prenatal visits. During those visits, your health care provider will examine you, discuss any test results you may have, and talk with you about how you are feeling.  This information is not intended to replace advice given to you by your health care provider. Make sure you discuss any questions you have with your health care provider.  Document Released: 06/09/2001 Document Revised: 05/27/2016 Document Reviewed: 05/27/2016  Elsevier Interactive Patient Education  2019 [employer]

## 2018-07-19 NOTE — Progress Notes (Signed)
Patient ID: Zoe Armstrong, female   DOB: 11/16/1990, 28 y.o.   MRN: [id number] History of Present Illness: Zoe Armstrong is a 28 year old white female, married, in for UPT.She stopped OCs in November per Dr Jeannette How recommendation for ?Raynaud"s. She has had 4-5 +HPTs. 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  PCP is Dr Dimas Aguas.   Current Medications, Allergies, Past Medical History, Past Surgical History, Family History and Social History were reviewed in [employer] record.     Review of Systems: +missed period, with 4-5 +HPTS +tired +Urinary frequency     Physical Exam:BP 120/72 (BP Location: Left Arm, Patient Position: Sitting, Cuff Size: Normal)   Pulse 82   Ht 5\' 3"  (1.6 m)   Wt 141 lb (64 kg)   LMP 05/29/2018   BMI 24.98 kg/m   UPT +, about 7+2 weeks by LMP with EDD 03/06/2019 Urine dipstick 2+ leuks,1+ketones, trace blood General:  Well developed, well nourished, no acute distress Skin:  Warm and dry Neck:  Midline trachea, normal thyroid, good ROM, no lymphadenopathy Lungs; Clear to auscultation bilaterally Cardiovascular: Regular rate and rhythm Abdomen:  Soft, non tender Psych:  No mood changes, alert and cooperative,seems happy Fall risk is low.   Impression: 1. Pregnancy examination or test, positive result   2. Urinary frequency   3. Less than [redacted] weeks gestation of pregnancy   4. Encounter to determine fetal viability of pregnancy, single or unspecified fetus       Plan: Continue PNV UA C&S sent Eat often Return in 1 week for dating [place] and 2 weeks New OB Review handouts on First trimester and by [facility]

## 2018-07-20 LAB — MICROSCOPIC EXAMINATION: Casts: NONE SEEN /lpf

## 2018-07-20 LAB — URINALYSIS, ROUTINE W REFLEX MICROSCOPIC
Bilirubin, UA: NEGATIVE
Glucose, UA: NEGATIVE
Nitrite, UA: NEGATIVE
PH UA: 7 (ref 5.0–7.5)
Protein, UA: NEGATIVE
RBC, UA: NEGATIVE
Specific Gravity, UA: 1.016 (ref 1.005–1.030)
Urobilinogen, Ur: 0.2 mg/dL (ref 0.2–1.0)

## 2018-07-22 ENCOUNTER — Telehealth: Payer: Self-pay | Admitting: Adult Health

## 2018-07-22 DIAGNOSIS — R350 Frequency of micturition: Secondary | ICD-10-CM

## 2018-07-22 LAB — URINE CULTURE

## 2018-07-22 MED ORDER — AMOXICILLIN-POT CLAVULANATE 875-125 MG PO TABS: 1.0000 | ORAL_TABLET | Freq: Two times a day (BID) | ORAL | 0 refills | Status: DC

## 2018-07-22 NOTE — Telephone Encounter (Signed)
Left message that urine +klebsiella sent Rx for Augmentin to [facility], push fluids and do not hold urine

## 2018-07-26 ENCOUNTER — Other Ambulatory Visit: Payer: Self-pay | Admitting: Adult Health

## 2018-07-26 DIAGNOSIS — Z3A01 Less than 8 weeks gestation of pregnancy: Secondary | ICD-10-CM

## 2018-07-26 DIAGNOSIS — O3680X0 Pregnancy with inconclusive fetal viability, not applicable or unspecified: Secondary | ICD-10-CM

## 2018-07-27 ENCOUNTER — Encounter: Payer: Self-pay | Admitting: Obstetrics & Gynecology

## 2018-07-27 ENCOUNTER — Ambulatory Visit (INDEPENDENT_AMBULATORY_CARE_PROVIDER_SITE_OTHER): Payer: BC Managed Care – PPO

## 2018-07-27 DIAGNOSIS — Z3A01 Less than 8 weeks gestation of pregnancy: Secondary | ICD-10-CM | POA: Diagnosis not present

## 2018-07-27 DIAGNOSIS — O3680X0 Pregnancy with inconclusive fetal viability, not applicable or unspecified: Secondary | ICD-10-CM | POA: Diagnosis not present

## 2018-08-11 ENCOUNTER — Ambulatory Visit: Payer: BC Managed Care – PPO | Admitting: *Deleted

## 2018-08-11 ENCOUNTER — Encounter: Payer: Self-pay | Admitting: Advanced Practice Midwife

## 2018-08-11 ENCOUNTER — Other Ambulatory Visit: Payer: Self-pay

## 2018-08-11 ENCOUNTER — Ambulatory Visit (INDEPENDENT_AMBULATORY_CARE_PROVIDER_SITE_OTHER): Payer: BC Managed Care – PPO | Admitting: Advanced Practice Midwife

## 2018-08-11 VITALS — BP 125/74 | HR 73 | Wt 142.0 lb

## 2018-08-11 DIAGNOSIS — Z23 Encounter for immunization: Secondary | ICD-10-CM

## 2018-08-11 DIAGNOSIS — Z3682 Encounter for antenatal screening for nuchal translucency: Secondary | ICD-10-CM

## 2018-08-11 DIAGNOSIS — Z331 Pregnant state, incidental: Secondary | ICD-10-CM

## 2018-08-11 DIAGNOSIS — Z1389 Encounter for screening for other disorder: Secondary | ICD-10-CM

## 2018-08-11 DIAGNOSIS — Z3A10 10 weeks gestation of pregnancy: Secondary | ICD-10-CM

## 2018-08-11 DIAGNOSIS — Z3400 Encounter for supervision of normal first pregnancy, unspecified trimester: Secondary | ICD-10-CM | POA: Insufficient documentation

## 2018-08-11 DIAGNOSIS — Z3401 Encounter for supervision of normal first pregnancy, first trimester: Secondary | ICD-10-CM

## 2018-08-11 LAB — POCT URINALYSIS DIPSTICK OB
Blood, UA: NEGATIVE
Glucose, UA: NEGATIVE
Ketones, UA: NEGATIVE
Leukocytes, UA: NEGATIVE
Nitrite, UA: NEGATIVE
POC,PROTEIN,UA: NEGATIVE

## 2018-08-11 NOTE — Patient Instructions (Signed)
 First Trimester of Pregnancy The first trimester of pregnancy is from week 1 until the end of week 12 (months 1 through 3). A week after a sperm fertilizes an egg, the egg will implant on the wall of the uterus. This embryo will begin to develop into a baby. Genes from you and your partner are forming the baby. The female genes determine whether the baby is a boy or a girl. At 6-8 weeks, the eyes and face are formed, and the heartbeat can be seen on ultrasound. At the end of 12 weeks, all the baby's organs are formed.  Now that you are pregnant, you will want to do everything you can to have a healthy baby. Two of the most important things are to get good prenatal care and to follow your health care provider's instructions. Prenatal care is all the medical care you receive before the baby's birth. This care will help prevent, find, and treat any problems during the pregnancy and childbirth. BODY CHANGES Your body goes through many changes during pregnancy. The changes vary from woman to woman.   You may gain or lose a couple of pounds at first.  You may feel sick to your stomach (nauseous) and throw up (vomit). If the vomiting is uncontrollable, call your health care provider.  You may tire easily.  You may develop headaches that can be relieved by medicines approved by your health care provider.  You may urinate more often. Painful urination may mean you have a bladder infection.  You may develop heartburn as a result of your pregnancy.  You may develop constipation because certain hormones are causing the muscles that push waste through your intestines to slow down.  You may develop hemorrhoids or swollen, bulging veins (varicose veins).  Your breasts may begin to grow larger and become tender. Your nipples may stick out more, and the tissue that surrounds them (areola) may become darker.  Your gums may bleed and may be sensitive to brushing and flossing.  Dark spots or blotches  (chloasma, mask of pregnancy) may develop on your face. This will likely fade after the baby is born.  Your menstrual periods will stop.  You may have a loss of appetite.  You may develop cravings for certain kinds of food.  You may have changes in your emotions from day to day, such as being excited to be pregnant or being concerned that something may go wrong with the pregnancy and baby.  You may have more vivid and strange dreams.  You may have changes in your hair. These can include thickening of your hair, rapid growth, and changes in texture. Some women also have hair loss during or after pregnancy, or hair that feels dry or thin. Your hair will most likely return to normal after your baby is born. WHAT TO EXPECT AT YOUR PRENATAL VISITS During a routine prenatal visit:  You will be weighed to make sure you and the baby are growing normally.  Your blood pressure will be taken.  Your abdomen will be measured to track your baby's growth.  The fetal heartbeat will be listened to starting around week 10 or 12 of your pregnancy.  Test results from any previous visits will be discussed. Your health care provider may ask you:  How you are feeling.  If you are feeling the baby move.  If you have had any abnormal symptoms, such as leaking fluid, bleeding, severe headaches, or abdominal cramping.  If you have any questions. Other   tests that may be performed during your first trimester include:  Blood tests to find your blood type and to check for the presence of any previous infections. They will also be used to check for low iron levels (anemia) and Rh antibodies. Later in the pregnancy, blood tests for diabetes will be done along with other tests if problems develop.  Urine tests to check for infections, diabetes, or protein in the urine.  An ultrasound to confirm the proper growth and development of the baby.  An amniocentesis to check for possible genetic problems.  Fetal  screens for spina bifida and Down syndrome.  You may need other tests to make sure you and the baby are doing well. HOME CARE INSTRUCTIONS  Medicines  Follow your health care provider's instructions regarding medicine use. Specific medicines may be either safe or unsafe to take during pregnancy.  Take your prenatal vitamins as directed.  If you develop constipation, try taking a stool softener if your health care provider approves. Diet  Eat regular, well-balanced meals. Choose a variety of foods, such as meat or vegetable-based protein, fish, milk and low-fat dairy products, vegetables, fruits, and whole grain breads and cereals. Your health care provider will help you determine the amount of weight gain that is right for you.  Avoid raw meat and uncooked cheese. These carry germs that can cause birth defects in the baby.  Eating four or five small meals rather than three large meals a day may help relieve nausea and vomiting. If you start to feel nauseous, eating a few soda crackers can be helpful. Drinking liquids between meals instead of during meals also seems to help nausea and vomiting.  If you develop constipation, eat more high-fiber foods, such as fresh vegetables or fruit and whole grains. Drink enough fluids to keep your urine clear or pale yellow. Activity and Exercise  Exercise only as directed by your health care provider. Exercising will help you:  Control your weight.  Stay in shape.  Be prepared for labor and delivery.  Experiencing pain or cramping in the lower abdomen or low back is a good sign that you should stop exercising. Check with your health care provider before continuing normal exercises.  Try to avoid standing for long periods of time. Move your legs often if you must stand in one place for a long time.  Avoid heavy lifting.  Wear low-heeled shoes, and practice good posture.  You may continue to have sex unless your health care provider directs you  otherwise. Relief of Pain or Discomfort  Wear a good support bra for breast tenderness.   Take warm sitz baths to soothe any pain or discomfort caused by hemorrhoids. Use hemorrhoid cream if your health care provider approves.   Rest with your legs elevated if you have leg cramps or low back pain.  If you develop varicose veins in your legs, wear support hose. Elevate your feet for 15 minutes, 3-4 times a day. Limit salt in your diet. Prenatal Care  Schedule your prenatal visits by the twelfth week of pregnancy. They are usually scheduled monthly at first, then more often in the last 2 months before delivery.  Write down your questions. Take them to your prenatal visits.  Keep all your prenatal visits as directed by your health care provider. Safety  Wear your seat belt at all times when driving.  Make a list of emergency phone numbers, including numbers for family, friends, the hospital, and police and fire departments. General   Tips  Ask your health care provider for a referral to a local prenatal education class. Begin classes no later than at the beginning of month 6 of your pregnancy.  Ask for help if you have counseling or nutritional needs during pregnancy. Your health care provider can offer advice or refer you to specialists for help with various needs.  Do not use hot tubs, steam rooms, or saunas.  Do not douche or use tampons or scented sanitary pads.  Do not cross your legs for long periods of time.  Avoid cat litter boxes and soil used by cats. These carry germs that can cause birth defects in the baby and possibly loss of the fetus by miscarriage or stillbirth.  Avoid all smoking, herbs, alcohol, and medicines not prescribed by your health care provider. Chemicals in these affect the formation and growth of the baby.  Schedule a dentist appointment. At home, brush your teeth with a soft toothbrush and be gentle when you floss. SEEK MEDICAL CARE IF:   You have  dizziness.  You have mild pelvic cramps, pelvic pressure, or nagging pain in the abdominal area.  You have persistent nausea, vomiting, or diarrhea.  You have a bad smelling vaginal discharge.  You have pain with urination.  You notice increased swelling in your face, hands, legs, or ankles. SEEK IMMEDIATE MEDICAL CARE IF:   You have a fever.  You are leaking fluid from your vagina.  You have spotting or bleeding from your vagina.  You have severe abdominal cramping or pain.  You have rapid weight gain or loss.  You vomit blood or material that looks like coffee grounds.  You are exposed to German measles and have never had them.  You are exposed to fifth disease or chickenpox.  You develop a severe headache.  You have shortness of breath.  You have any kind of trauma, such as from a fall or a car accident. Document Released: 06/09/2001 Document Revised: 10/30/2013 Document Reviewed: 04/25/2013 [employer] Patient Information 2015 [employer]. This information is not intended to replace advice given to you by your health care provider. Make sure you discuss any questions you have with your health care provider.   Nausea & Vomiting  Have saltine crackers or pretzels by your bed and eat a few bites before you raise your head out of bed in the morning  Eat small frequent meals throughout the day instead of large meals  Drink plenty of fluids throughout the day to stay hydrated, just don't drink a lot of fluids with your meals.  This can make your stomach fill up faster making you feel sick  Do not brush your teeth right after you eat  Products with real ginger are good for nausea, like ginger ale and ginger hard candy Make sure it says made with real ginger!  Sucking on sour candy like lemon heads is also good for nausea  If your prenatal vitamins make you nauseated, take them at night so you will sleep through the nausea  Sea Bands  If you feel like you need  medicine for the nausea & vomiting please let us know  If you are unable to keep any fluids or food down please let us know   Constipation  Drink plenty of fluid, preferably water, throughout the day  Eat foods high in fiber such as fruits, vegetables, and grains  Exercise, such as walking, is a good way to keep your bowels regular  Drink warm fluids, especially warm   prune juice, or decaf coffee  Eat a 1/2 cup of real oatmeal (not instant), 1/2 cup applesauce, and 1/2-1 cup warm prune juice every day  If needed, you may take Colace (docusate sodium) stool softener once or twice a day to help keep the stool soft. If you are pregnant, wait until you are out of your first trimester (12-14 weeks of pregnancy)  If you still are having problems with constipation, you may take Miralax once daily as needed to help keep your bowels regular.  If you are pregnant, wait until you are out of your first trimester (12-14 weeks of pregnancy)  Safe Medications in Pregnancy   Acne: Benzoyl Peroxide Salicylic Acid  Backache/Headache: Tylenol: 2 regular strength every 4 hours OR              2 Extra strength every 6 hours  Colds/Coughs/Allergies: Benadryl (alcohol free) 25 mg every 6 hours as needed Breath right strips Claritin Cepacol throat lozenges Chloraseptic throat spray Cold-Eeze- up to three times per day Cough drops, alcohol free Flonase (by prescription only) Guaifenesin Mucinex Robitussin DM (plain only, alcohol free) Saline nasal spray/drops Sudafed (pseudoephedrine) & Actifed ** use only after [redacted] weeks gestation and if you do not have high blood pressure Tylenol Vicks Vaporub Zinc lozenges Zyrtec   Constipation: Colace Ducolax suppositories Fleet enema Glycerin suppositories Metamucil Milk of magnesia Miralax Senokot Smooth move tea  Diarrhea: Kaopectate Imodium A-D  *NO pepto Bismol  Hemorrhoids: Anusol Anusol HC Preparation  H Tucks  Indigestion: Tums Maalox Mylanta Zantac  Pepcid  Insomnia: Benadryl (alcohol free) 25mg every 6 hours as needed Tylenol PM Unisom, no Gelcaps  Leg Cramps: Tums MagGel  Nausea/Vomiting:  Bonine Dramamine Emetrol Ginger extract Sea bands Meclizine  Nausea medication to take during pregnancy:  Unisom (doxylamine succinate 25 mg tablets) Take one tablet daily at bedtime. If symptoms are not adequately controlled, the dose can be increased to a maximum recommended dose of two tablets daily (1/2 tablet in the morning, 1/2 tablet mid-afternoon and one at bedtime). Vitamin B6 100mg tablets. Take one tablet twice a day (up to 200 mg per day).  Skin Rashes: Aveeno products Benadryl cream or 25mg every 6 hours as needed Calamine Lotion 1% cortisone cream  Yeast infection: Gyne-lotrimin 7 Monistat 7   **If taking multiple medications, please check labels to avoid duplicating the same active ingredients **take medication as directed on the label ** Do not exceed 4000 mg of tylenol in 24 hours **Do not take medications that contain aspirin or ibuprofen      

## 2018-08-11 NOTE — Progress Notes (Signed)
INITIAL OBSTETRICAL VISIT Patient name: Zoe Armstrong MRN [id number]  Date of birth: 1991-05-03 Chief Complaint:   Initial Prenatal Visit  History of Present Illness:   Zoe Armstrong is a 28 y.o. G60P0000 Caucasian female at [redacted]w[redacted]d by 6/17 with an Estimated Date of Delivery: 03/05/19 being seen today for her initial obstetrical visit.   Her obstetrical history is significant for first baby. Feels great.   Today she reports no complaints.  Patient's last menstrual period was 05/29/2018. Last pap 2017, normal. Results were: normal Review of Systems:   Pertinent items are noted in HPI Denies cramping/contractions, leakage of fluid, vaginal bleeding, abnormal vaginal discharge w/ itching/odor/irritation, headaches, visual changes, shortness of breath, chest pain, abdominal pain, severe nausea/vomiting, or problems with urination or bowel movements unless otherwise stated above.  Pertinent History Reviewed:  Reviewed past medical,surgical, social, obstetrical and family history.  Reviewed problem list, medications and allergies. OB History  Gravida Para Term Preterm AB Living  1 0 0 0 0 0  SAB TAB Ectopic Multiple Live Births  0 0 0 0      # Outcome Date GA Lbr Len/2nd Weight Sex Delivery Anes PTL Lv  1 Current            Physical Assessment:   Vitals:   08/11/18 1557  BP: 125/74  Pulse: 73  Weight: 142 lb (64.4 kg)  Body mass index is 25.15 kg/m.       Physical Examination:  General appearance - well appearing, and in no distress  Mental status - alert, oriented to person, place, and time  Psych:  She has a normal mood and affect  Skin - warm and dry, normal color, no suspicious lesions noted  Chest - effort normal, all lung fields clear to auscultation bilaterally  Heart - normal rate and regular rhythm  Abdomen - soft, nontender  Extremities:  No swelling or varicosities noted    Fetal Heart Rate (bpm): 160 via [place]  Results for orders placed or performed in visit on 08/11/18 (from  the past 24 hour(s))  POC Urinalysis Dipstick OB   Collection Time: 08/11/18  4:16 PM  Result Value Ref Range   Color, UA     Clarity, UA     Glucose, UA Negative Negative   Bilirubin, UA     Ketones, UA neg    Spec Grav, UA     Blood, UA neg    pH, UA     POC,PROTEIN,UA Negative Negative, Trace, Small (1+), Moderate (2+), Large (3+), 4+   Urobilinogen, UA     Nitrite, UA neg    Leukocytes, UA Negative Negative   Appearance     Odor      Assessment & Plan:  1) Low-Risk Pregnancy G1P0000 at [redacted]w[redacted]d with an Estimated Date of Delivery: 03/05/19   2) Initial OB visit  3)   Meds: No orders of the defined types were placed in this encounter.   Initial labs obtained Continue prenatal vitamins Reviewed n/v relief measures and warning s/s to report Reviewed recommended weight gain based on pre-gravid BMI Encouraged well-balanced diet Genetic Screening discussed Integrated Screen: requested Cystic fibrosis screening discussed declined Ultrasound discussed; fetal survey: requested CCNC completed  Follow-up: Return in about 3 weeks (around 09/01/2018) for LROB, US:NT+1st IT.   Orders Placed This Encounter  Procedures  . GC/Chlamydia Probe Amp  . Urine Culture  . US Fetal Nuchal Translucency Measurement  . Flu Vaccine QUAD 36+ mos IM  . Obstetric Panel, Including HIV  .  Urinalysis, Routine w reflex microscopic  . Sickle cell screen  . Pain Management Screening Profile (10S)  . POC Urinalysis Dipstick OB    Jacklyn ShellFrances Cresenzo-Dishmon DNP, CNM 08/11/2018 5:10 PM

## 2018-08-12 LAB — SICKLE CELL SCREEN: Sickle Cell Screen: NEGATIVE

## 2018-08-12 LAB — PMP SCREEN PROFILE (10S), URINE
Amphetamine Scrn, Ur: NEGATIVE ng/mL
BARBITURATE SCREEN URINE: NEGATIVE ng/mL
BENZODIAZEPINE SCREEN, URINE: NEGATIVE ng/mL
CANNABINOIDS UR QL SCN: NEGATIVE ng/mL
Cocaine (Metab) Scrn, Ur: NEGATIVE ng/mL
Creatinine(Crt), U: 24.7 mg/dL (ref 20.0–300.0)
Methadone Screen, Urine: NEGATIVE ng/mL
OXYCODONE+OXYMORPHONE UR QL SCN: NEGATIVE ng/mL
Opiate Scrn, Ur: NEGATIVE ng/mL
Ph of Urine: 6.3 (ref 4.5–8.9)
Phencyclidine Qn, Ur: NEGATIVE ng/mL
Propoxyphene Scrn, Ur: NEGATIVE ng/mL

## 2018-08-12 LAB — OBSTETRIC PANEL, INCLUDING HIV
Antibody Screen: NEGATIVE
Basophils Absolute: 0 10*3/uL (ref 0.0–0.2)
Basos: 0 %
EOS (ABSOLUTE): 0.2 10*3/uL (ref 0.0–0.4)
Eos: 2 %
HIV SCREEN 4TH GENERATION: NONREACTIVE
Hematocrit: 37.7 % (ref 34.0–46.6)
Hemoglobin: 13 g/dL (ref 11.1–15.9)
Hepatitis B Surface Ag: NEGATIVE
Immature Grans (Abs): 0 10*3/uL (ref 0.0–0.1)
Immature Granulocytes: 0 %
Lymphocytes Absolute: 2.3 10*3/uL (ref 0.7–3.1)
Lymphs: 23 %
MCH: 29 pg (ref 26.6–33.0)
MCHC: 34.5 g/dL (ref 31.5–35.7)
MCV: 84 fL (ref 79–97)
Monocytes Absolute: 0.6 10*3/uL (ref 0.1–0.9)
Monocytes: 7 %
Neutrophils Absolute: 6.7 10*3/uL (ref 1.4–7.0)
Neutrophils: 68 %
Platelets: 275 10*3/uL (ref 150–450)
RBC: 4.49 x10E6/uL (ref 3.77–5.28)
RDW: 12.9 % (ref 11.7–15.4)
RPR Ser Ql: NONREACTIVE
Rh Factor: POSITIVE
Rubella Antibodies, IGG: 1.93 index (ref >0.99)
WBC: 9.8 10*3/uL (ref 3.4–10.8)

## 2018-08-12 LAB — URINALYSIS, ROUTINE W REFLEX MICROSCOPIC
Bilirubin, UA: NEGATIVE
Glucose, UA: NEGATIVE
Ketones, UA: NEGATIVE
Nitrite, UA: NEGATIVE
Protein, UA: NEGATIVE
RBC, UA: NEGATIVE
Specific Gravity, UA: 1.008 (ref 1.005–1.030)
Urobilinogen, Ur: 0.2 mg/dL (ref 0.2–1.0)
pH, UA: 6.5 (ref 5.0–7.5)

## 2018-08-12 LAB — MICROSCOPIC EXAMINATION
Casts: NONE SEEN /lpf
RBC, UA: NONE SEEN /hpf (ref 0–2)

## 2018-08-14 LAB — URINE CULTURE

## 2018-08-14 LAB — GC/CHLAMYDIA PROBE AMP
Chlamydia trachomatis, NAA: NEGATIVE
Neisseria gonorrhoeae by PCR: NEGATIVE

## 2018-08-16 ENCOUNTER — Telehealth: Payer: Self-pay | Admitting: Adult Health

## 2018-08-16 NOTE — Telephone Encounter (Signed)
Patient stated, she will be traveling to [place] in April, she wants to know what kind of traveling restrictions she'll have.  [phone]

## 2018-08-16 NOTE — Telephone Encounter (Signed)
LMOVM that travel is fine but to get up and move whether on plane or car at least every 2 hours, drink plenty of water and to check with airline to make sure there are no restrictions for her.  Also, reminded to check babyscripts app for more information.

## 2018-09-01 ENCOUNTER — Other Ambulatory Visit: Payer: Self-pay

## 2018-09-01 ENCOUNTER — Ambulatory Visit (INDEPENDENT_AMBULATORY_CARE_PROVIDER_SITE_OTHER): Payer: BC Managed Care – PPO | Admitting: Women's Health

## 2018-09-01 ENCOUNTER — Encounter: Payer: Self-pay | Admitting: Women's Health

## 2018-09-01 ENCOUNTER — Ambulatory Visit (INDEPENDENT_AMBULATORY_CARE_PROVIDER_SITE_OTHER): Payer: BC Managed Care – PPO

## 2018-09-01 VITALS — BP 115/74 | HR 74 | Wt 142.0 lb

## 2018-09-01 DIAGNOSIS — Z1389 Encounter for screening for other disorder: Secondary | ICD-10-CM

## 2018-09-01 DIAGNOSIS — Z3402 Encounter for supervision of normal first pregnancy, second trimester: Secondary | ICD-10-CM

## 2018-09-01 DIAGNOSIS — Z3A13 13 weeks gestation of pregnancy: Secondary | ICD-10-CM

## 2018-09-01 DIAGNOSIS — Z331 Pregnant state, incidental: Secondary | ICD-10-CM

## 2018-09-01 DIAGNOSIS — Z3401 Encounter for supervision of normal first pregnancy, first trimester: Secondary | ICD-10-CM

## 2018-09-01 DIAGNOSIS — Z3682 Encounter for antenatal screening for nuchal translucency: Secondary | ICD-10-CM | POA: Diagnosis not present

## 2018-09-01 DIAGNOSIS — Z1379 Encounter for other screening for genetic and chromosomal anomalies: Secondary | ICD-10-CM

## 2018-09-01 LAB — POCT URINALYSIS DIPSTICK OB
Glucose, UA: NEGATIVE
Leukocytes, UA: NEGATIVE
Nitrite, UA: NEGATIVE
POC,PROTEIN,UA: NEGATIVE

## 2018-09-01 NOTE — Patient Instructions (Signed)
Zoe Armstrong, I greatly value your feedback.  If you receive a survey following your visit with [place] today, we appreciate you taking the time to fill it out.  Thanks, Joellyn Haff, CNM, [facility]  [facility] HAS MOVED!!! It is now [facility] at [facility] ([address] [place], [place] [address]) Entrance located off of [address] Free 24/7 valet parking     Second Trimester of Pregnancy The second trimester is from week 14 through week 27 (months 4 through 6). The second trimester is often a time when you feel your best. Your body has adjusted to being pregnant, and you begin to feel better physically. Usually, morning sickness has lessened or quit completely, you may have more energy, and you may have an increase in appetite. The second trimester is also a time when the fetus is growing rapidly. At the end of the sixth month, the fetus is about 9 inches long and weighs about 1 pounds. You will likely begin to feel the baby move (quickening) between 16 and 20 weeks of pregnancy. Body changes during your second trimester Your body continues to go through many changes during your second trimester. The changes vary from woman to woman.  Your weight will continue to increase. You will notice your lower abdomen bulging out.  You may begin to get stretch marks on your hips, abdomen, and breasts.  You may develop headaches that can be relieved by medicines. The medicines should be approved by your health care provider.  You may urinate more often because the fetus is pressing on your bladder.  You may develop or continue to have heartburn as a result of your pregnancy.  You may develop constipation because certain hormones are causing the muscles that push waste through your intestines to slow down.  You may develop hemorrhoids or swollen, bulging veins (varicose veins).  You may have back pain. This is caused by: ? Weight gain. ? Pregnancy hormones that are relaxing the  joints in your pelvis. ? A shift in weight and the muscles that support your balance.  Your breasts will continue to grow and they will continue to become tender.  Your gums may bleed and may be sensitive to brushing and flossing.  Dark spots or blotches (chloasma, mask of pregnancy) may develop on your face. This will likely fade after the baby is born.  A dark line from your belly button to the pubic area (linea nigra) may appear. This will likely fade after the baby is born.  You may have changes in your hair. These can include thickening of your hair, rapid growth, and changes in texture. Some women also have hair loss during or after pregnancy, or hair that feels dry or thin. Your hair will most likely return to normal after your baby is born.  What to expect at prenatal visits During a routine prenatal visit:  You will be weighed to make sure you and the fetus are growing normally.  Your blood pressure will be taken.  Your abdomen will be measured to track your baby's growth.  The fetal heartbeat will be listened to.  Any test results from the previous visit will be discussed.  Your health care provider may ask you:  How you are feeling.  If you are feeling the baby move.  If you have had any abnormal symptoms, such as leaking fluid, bleeding, severe headaches, or abdominal cramping.  If you are using any tobacco products, including cigarettes, chewing tobacco,  and electronic cigarettes.  If you have any questions.  Other tests that may be performed during your second trimester include:  Blood tests that check for: ? Low iron levels (anemia). ? High blood sugar that affects pregnant women (gestational diabetes) between 83 and 28 weeks. ? Rh antibodies. This is to check for a protein on red blood cells (Rh factor).  Urine tests to check for infections, diabetes, or protein in the urine.  An ultrasound to confirm the proper growth and development of the baby.  An  amniocentesis to check for possible genetic problems.  Fetal screens for spina bifida and Down syndrome.  HIV (human immunodeficiency virus) testing. Routine prenatal testing includes screening for HIV, unless you choose not to have this test.  Follow these instructions at home: Medicines  Follow your health care provider's instructions regarding medicine use. Specific medicines may be either safe or unsafe to take during pregnancy.  Take a prenatal vitamin that contains at least 600 micrograms (mcg) of folic acid.  If you develop constipation, try taking a stool softener if your health care provider approves. Eating and drinking  Eat a balanced diet that includes fresh fruits and vegetables, whole grains, good sources of protein such as meat, eggs, or tofu, and low-fat dairy. Your health care provider will help you determine the amount of weight gain that is right for you.  Avoid raw meat and uncooked cheese. These carry germs that can cause birth defects in the baby.  If you have low calcium intake from food, talk to your health care provider about whether you should take a daily calcium supplement.  Limit foods that are high in fat and processed sugars, such as fried and sweet foods.  To prevent constipation: ? Drink enough fluid to keep your urine clear or pale yellow. ? Eat foods that are high in fiber, such as fresh fruits and vegetables, whole grains, and beans. Activity  Exercise only as directed by your health care provider. Most women can continue their usual exercise routine during pregnancy. Try to exercise for 30 minutes at least 5 days a week. Stop exercising if you experience uterine contractions.  Avoid heavy lifting, wear low heel shoes, and practice good posture.  A sexual relationship may be continued unless your health care provider directs you otherwise. Relieving pain and discomfort  Wear a good support bra to prevent discomfort from breast  tenderness.  Take warm sitz baths to soothe any pain or discomfort caused by hemorrhoids. Use hemorrhoid cream if your health care provider approves.  Rest with your legs elevated if you have leg cramps or low back pain.  If you develop varicose veins, wear support hose. Elevate your feet for 15 minutes, 3-4 times a day. Limit salt in your diet. Prenatal Care  Write down your questions. Take them to your prenatal visits.  Keep all your prenatal visits as told by your health care provider. This is important. Safety  Wear your seat belt at all times when driving.  Make a list of emergency phone numbers, including numbers for family, friends, the hospital, and police and fire departments. General instructions  Ask your health care provider for a referral to a local prenatal education class. Begin classes no later than the beginning of month 6 of your pregnancy.  Ask for help if you have counseling or nutritional needs during pregnancy. Your health care provider can offer advice or refer you to specialists for help with various needs.  Do not use hot  tubs, steam rooms, or saunas.  Do not douche or use tampons or scented sanitary pads.  Do not cross your legs for long periods of time.  Avoid cat litter boxes and soil used by cats. These carry germs that can cause birth defects in the baby and possibly loss of the fetus by miscarriage or stillbirth.  Avoid all smoking, herbs, alcohol, and unprescribed drugs. Chemicals in these products can affect the formation and growth of the baby.  Do not use any products that contain nicotine or tobacco, such as cigarettes and e-cigarettes. If you need help quitting, ask your health care provider.  Visit your dentist if you have not gone yet during your pregnancy. Use a soft toothbrush to brush your teeth and be gentle when you floss. Contact a health care provider if:  You have dizziness.  You have mild pelvic cramps, pelvic pressure, or  nagging pain in the abdominal area.  You have persistent nausea, vomiting, or diarrhea.  You have a bad smelling vaginal discharge.  You have pain when you urinate. Get help right away if:  You have a fever.  You are leaking fluid from your vagina.  You have spotting or bleeding from your vagina.  You have severe abdominal cramping or pain.  You have rapid weight gain or weight loss.  You have shortness of breath with chest pain.  You notice sudden or extreme swelling of your face, hands, ankles, feet, or legs.  You have not felt your baby move in over an hour.  You have severe headaches that do not go away when you take medicine.  You have vision changes. Summary  The second trimester is from week 14 through week 27 (months 4 through 6). It is also a time when the fetus is growing rapidly.  Your body goes through many changes during pregnancy. The changes vary from woman to woman.  Avoid all smoking, herbs, alcohol, and unprescribed drugs. These chemicals affect the formation and growth your baby.  Do not use any tobacco products, such as cigarettes, chewing tobacco, and e-cigarettes. If you need help quitting, ask your health care provider.  Contact your health care provider if you have any questions. Keep all prenatal visits as told by your health care provider. This is important. This information is not intended to replace advice given to you by your health care provider. Make sure you discuss any questions you have with your health care provider. Document Released: 06/09/2001 Document Revised: 11/21/2015 Document Reviewed: 08/16/2012 Elsevier Interactive Patient Education  2017 [employer].

## 2018-09-01 NOTE — Progress Notes (Signed)
[place] 13+4 wks,measurements c/w dates,crl 75.12 mm,NB present,NT 1.8 mm,fhr 148 bpm,posterior placenta gr 0

## 2018-09-01 NOTE — Progress Notes (Signed)
   LOW-RISK PREGNANCY VISIT Patient name: Zoe Armstrong MRN [id number]  Date of birth: Mar 21, 1991 Chief Complaint:   Routine Prenatal Visit (nt/it)  History of Present Illness:   Zoe Armstrong is a 28 y.o. G1P0000 female at [redacted]w[redacted]d with an Estimated Date of Delivery: 03/05/19 being seen today for ongoing management of a low-risk pregnancy.  Today she reports no complaints. Going to [place], wants to do [place], but needs note. Contractions: Not present. Vag. Bleeding: None.  Movement: Absent. denies leaking of fluid. Review of Systems:   Pertinent items are noted in HPI Denies abnormal vaginal discharge w/ itching/odor/irritation, headaches, visual changes, shortness of breath, chest pain, abdominal pain, severe nausea/vomiting, or problems with urination or bowel movements unless otherwise stated above. Pertinent History Reviewed:  Reviewed past medical,surgical, social, obstetrical and family history.  Reviewed problem list, medications and allergies. Physical Assessment:   Vitals:   09/01/18 1631  BP: 115/74  Pulse: 74  Weight: 142 lb (64.4 kg)  Body mass index is 25.15 kg/m.        Physical Examination:   General appearance: Well appearing, and in no distress  Mental status: Alert, oriented to person, place, and time  Skin: Warm & dry  Cardiovascular: Normal heart rate noted  Respiratory: Normal respiratory effort, no distress  Abdomen: Soft, gravid, nontender  Pelvic: Cervical exam deferred         Extremities: Edema: None  Fetal Status: Fetal Heart Rate (bpm): 148 u/s   Movement: Absent    [place] 13+4 wks,measurements c/w dates,crl 75.12 mm,NB present,NT 1.8 mm,fhr 148 bpm,posterior placenta gr 0  Results for orders placed or performed in visit on 09/01/18 (from the past 24 hour(s))  POC Urinalysis Dipstick OB   Collection Time: 09/01/18  5:06 PM  Result Value Ref Range   Color, UA     Clarity, UA     Glucose, UA Negative Negative   Bilirubin, UA     Ketones, UA large    Spec  Grav, UA     Blood, UA trace    pH, UA     POC,PROTEIN,UA Negative Negative, Trace, Small (1+), Moderate (2+), Large (3+), 4+   Urobilinogen, UA     Nitrite, UA neg    Leukocytes, UA Negative Negative   Appearance     Odor      Assessment & Plan:  1) Low-risk pregnancy G1P0000 at [redacted]w[redacted]d with an Estimated Date of Delivery: 03/05/19    Meds: No orders of the defined types were placed in this encounter.  Labs/procedures today: 1st IT/NT  Plan:  Continue routine obstetrical care   Reviewed: Preterm labor symptoms and general obstetric precautions including but not limited to vaginal bleeding, contractions, leaking of fluid and fetal movement were reviewed in detail with the patient.  All questions were answered  Follow-up: Return in about 3 weeks (around 09/22/2018) for LROB, 2nd IT.  Orders Placed This Encounter  Procedures  . Integrated 1  . POC Urinalysis Dipstick OB   [name] CNM, [facility] 09/01/2018

## 2018-09-03 LAB — INTEGRATED 1
Crown Rump Length: 75.1 mm
Gest. Age on Collection Date: 13.4 weeks
Maternal Age at EDD: 29.4 yr
Nuchal Translucency (NT): 1.8 mm
Number of Fetuses: 1
PAPP-A Value: 1570.2 ng/mL
Weight: 142 [lb_av]

## 2018-09-21 ENCOUNTER — Telehealth: Payer: Self-pay | Admitting: *Deleted

## 2018-09-21 NOTE — Telephone Encounter (Signed)
Due to Coronavirus, pt offered Baby[employer]s optimized schedule, she accepts, agrees to check bp's weekly. Next scheduled visit will be at 19wks, for anatomy scan and visit. Debbe Odea Chiyo Fay 09/21/2018 10:49 AM

## 2018-09-22 ENCOUNTER — Encounter: Payer: BC Managed Care – PPO | Admitting: Women's Health

## 2018-09-30 ENCOUNTER — Other Ambulatory Visit: Payer: Self-pay | Admitting: Women's Health

## 2018-09-30 ENCOUNTER — Telehealth: Payer: Self-pay | Admitting: Women's Health

## 2018-09-30 MED ORDER — NITROFURANTOIN MONOHYD MACRO 100 MG PO CAPS: 100.0000 mg | ORAL_CAPSULE | Freq: Two times a day (BID) | ORAL | 0 refills | Status: DC

## 2018-09-30 NOTE — Telephone Encounter (Signed)
Patient called stating that she is having UTI symptoms and she would like something called into her pharmacy [facility]. Please contact pt when done.

## 2018-10-01 ENCOUNTER — Telehealth: Payer: BC Managed Care – PPO | Admitting: Physician Assistant

## 2018-10-01 DIAGNOSIS — N898 Other specified noninflammatory disorders of vagina: Secondary | ICD-10-CM

## 2018-10-01 DIAGNOSIS — N390 Urinary tract infection, site not specified: Secondary | ICD-10-CM

## 2018-10-01 DIAGNOSIS — Z3490 Encounter for supervision of normal pregnancy, unspecified, unspecified trimester: Secondary | ICD-10-CM

## 2018-10-01 NOTE — Progress Notes (Signed)
For the safety of you and your child, I recommend a face to face office visit with a health care provider.  Many mothers need to take medicines during their pregnancy and while nursing.  Almost all medicines pass into the breast milk in small quantities.  Most are generally considered safe for a mother to take but some medicines must be avoided.  After reviewing your E-Visit request, I recommend that you consult your OB/GYN or pediatrician for medical advice in relation to your condition and prescription medications while pregnant or breastfeeding.  I will say that weakness/fatigue can happen with UTI and during treatment (sometimes antibiotic can make you feel this way). Back pain should be resolving however if due to the UTI. I recommend you call your OB office and speak with the on-call doctor.   NOTE: If you entered your credit card information for this eVisit, you will not be charged. You may see a "hold" on your card for the $35 but that hold will drop off and you will not have a charge processed.  If you are having a true medical emergency please call 911.  If you need an urgent face to face visit, [place] has four urgent care centers for your convenience.  If you need care fast and have a high deductible or no insurance consider:   [email or web address] to reserve your spot online an avoid wait times  [facility] [address] [place], [place] [address] Modified hours of operation: Monday-Friday, 10 AM to 6 PM  Saturday & Sunday 10 AM to 4 PM  [facility] (New Address!) [address], [address] [place], [place] [address] *Just off [employer], across the road from [place]* Modified hours of operation: Monday-Friday, 10 AM to 5 PM  Closed Saturday & Sunday  [facility]'s modified hours of operation will be in effect from Wednesday, April 1st through Thursday, April 30th.  The following sites will take your  insurance:  . [facility]  [phone] Get Driving Directions Find a Provider at this Location  [address] [place], [place] [address] . 10 am to 8 pm Monday-Friday . 12 pm to 8 pm Saturday-Sunday   . [facility]  [phone] Get Driving Directions Find a Provider at this Location  [address], [address] [place], [place] [address] . 8 am to 8 pm Monday-Friday . 9 am to 6 pm Saturday . 11 am to 6 pm Sunday   . [facility]  [phone] Get Driving Directions  [address] [place], [place] [address] . 8 am to 8 pm Monday-Friday . 8 am to 4 pm Saturday-Sunday   Your e-visit answers were reviewed by a board certified advanced clinical practitioner to complete your personal care plan.  Thank you for using e-Visits.

## 2018-10-02 ENCOUNTER — Encounter (HOSPITAL_COMMUNITY): Payer: Self-pay | Admitting: *Deleted

## 2018-10-02 ENCOUNTER — Inpatient Hospital Stay (HOSPITAL_COMMUNITY)
Admission: AD | Admit: 2018-10-02 | Discharge: 2018-10-02 | Disposition: A | Discharge status: Home / Self Care | Payer: BC Managed Care – PPO | Attending: Obstetrics & Gynecology | Admitting: Obstetrics & Gynecology

## 2018-10-02 ENCOUNTER — Other Ambulatory Visit: Payer: Self-pay

## 2018-10-02 DIAGNOSIS — O26892 Other specified pregnancy related conditions, second trimester: Secondary | ICD-10-CM | POA: Insufficient documentation

## 2018-10-02 DIAGNOSIS — R61 Generalized hyperhidrosis: Secondary | ICD-10-CM | POA: Diagnosis not present

## 2018-10-02 DIAGNOSIS — O2342 Unspecified infection of urinary tract in pregnancy, second trimester: Secondary | ICD-10-CM | POA: Insufficient documentation

## 2018-10-02 DIAGNOSIS — Z87440 Personal history of urinary (tract) infections: Secondary | ICD-10-CM | POA: Insufficient documentation

## 2018-10-02 DIAGNOSIS — M545 Low back pain: Secondary | ICD-10-CM | POA: Diagnosis not present

## 2018-10-02 DIAGNOSIS — Z3A18 18 weeks gestation of pregnancy: Secondary | ICD-10-CM

## 2018-10-02 LAB — CBC
HCT: 33.5 % — ABNORMAL LOW (ref 36.0–46.0)
Hemoglobin: 11.3 g/dL — ABNORMAL LOW (ref 12.0–15.0)
MCH: 28.8 pg (ref 26.0–34.0)
MCHC: 33.7 g/dL (ref 30.0–36.0)
MCV: 85.2 fL (ref 80.0–100.0)
Platelets: 183 10*3/uL (ref 150–400)
RBC: 3.93 MIL/uL (ref 3.87–5.11)
RDW: 12.8 % (ref 11.5–15.5)
WBC: 9.3 10*3/uL (ref 4.0–10.5)
nRBC: 0 % (ref 0.0–0.2)

## 2018-10-02 LAB — URINALYSIS, ROUTINE W REFLEX MICROSCOPIC
Bilirubin Urine: NEGATIVE
Glucose, UA: NEGATIVE mg/dL
Hgb urine dipstick: NEGATIVE
Ketones, ur: NEGATIVE mg/dL
Nitrite: NEGATIVE
Protein, ur: NEGATIVE mg/dL
Specific Gravity, Urine: 1.003 — ABNORMAL LOW (ref 1.005–1.030)
pH: 6 (ref 5.0–8.0)

## 2018-10-02 MED ORDER — CEPHALEXIN 500 MG PO CAPS: 500.0000 mg | ORAL_CAPSULE | Freq: Four times a day (QID) | ORAL | 0 refills | Status: DC

## 2018-10-02 MED ORDER — CEPHALEXIN 500 MG PO CAPS
500.0000 mg | ORAL_CAPSULE | Freq: Once | ORAL | Status: AC
  Administered 2018-10-02: 19:00:00 500 mg via ORAL
  Filled 2018-10-02 (×2): qty 1

## 2018-10-02 NOTE — MAU Provider Note (Signed)
Chief Complaint: Back Pain (radiates around to lower abd. ) and Night Sweats   None     SUBJECTIVE HPI: Zoe Armstrong is a 28 y.o. G1P0000 at [redacted]w[redacted]d by LMP who presents to maternity admissions reporting back pain, urinary urgency and frequency, and night sweats x 2 days. She started Macrobid BID on Friday, after calling [facility] with her symptoms.  She reports frequent UTIs in last year and they usually improve in 48 hours after abx but her back pain is worsening and other symptoms are not improving. The pain is low in the right lower back, constant, aching pain that radiates around to her right flank.  She reports urinating frequently, only small amounts, "dribbling", with urgency. She is drinking water and cranberry juice and taking her Macrobid and Tylenol. There are no other symptoms. She has not tried any other treatments.    HPI  Past Medical History:  Diagnosis Date  . Acne   . Anxiety 07/25/2014  . Encounter for menstrual regulation 12/24/2015  . Fatigue 12/24/2015  . Menstrual extraction 04/10/2013  . Weight gain 12/24/2015   Past Surgical History:  Procedure Laterality Date  . NO PAST SURGERIES     Social History   Socioeconomic History  . Marital status: Married    Spouse name: Romeo Apple   . Number of children: Not on file  . Years of education: Bachelor Degree  . Highest education level: Bachelor's degree (e.g., BA, AB, BS)  Occupational History  . Not on file  Social Needs  . Financial resource strain: Not on file  . Food insecurity:    Worry: Never true    Inability: Never true  . Transportation needs:    Medical: No    Non-medical: No  Tobacco Use  . Smoking status: Never Smoker  . Smokeless tobacco: Never Used  Substance and Sexual Activity  . Alcohol use: No  . Drug use: No  . Sexual activity: Yes    Birth control/protection: None  Lifestyle  . Physical activity:    Days per week: 1 day    Minutes per session: 30 min  . Stress: To some extent   Relationships  . Social connections:    Talks on phone: More than three times a week    Gets together: More than three times a week    Attends religious service: 1 to 4 times per year    Active member of club or organization: Yes    Attends meetings of clubs or organizations: 1 to 4 times per year    Relationship status: Married  . Intimate partner violence:    Fear of current or ex partner: No    Emotionally abused: No    Physically abused: No    Forced sexual activity: No  Other Topics Concern  . Not on file  Social History Narrative  . Not on file   No current facility-administered medications on file prior to encounter.    Current Outpatient Medications on File Prior to Encounter  Medication Sig Dispense Refill  . nitrofurantoin, macrocrystal-monohydrate, (MACROBID) 100 MG capsule Take 1 capsule (100 mg total) by mouth 2 (two) times daily. X 7 days 14 capsule 0  . Prenatal Vit-Fe Fumarate-FA (PRENATAL VITAMIN PO) Take by mouth daily.     No Known Allergies  ROS:  Review of Systems  Constitutional: Positive for diaphoresis. Negative for chills, fatigue and fever.  Respiratory: Negative for shortness of breath.   Cardiovascular: Negative for chest pain.  Genitourinary: Positive for  flank pain, frequency and urgency. Negative for difficulty urinating, dysuria, pelvic pain, vaginal bleeding, vaginal discharge and vaginal pain.  Musculoskeletal: Positive for back pain.  Neurological: Negative for dizziness and headaches.  Psychiatric/Behavioral: Negative.      I have reviewed patient's Past Medical Hx, Surgical Hx, Family Hx, Social Hx, medications and allergies.   Physical Exam   Patient Vitals for the past 24 hrs:  BP Temp Temp src Pulse Resp Height Weight  10/02/18 1533 119/78 98.2 F (36.8 C) Oral (!) 113 20 5\' 3"  (1.6 m) 65.3 kg   Constitutional: Well-developed, well-nourished female in no acute distress.  Cardiovascular: normal rate Respiratory: normal  effort GI: Abd soft, non-tender. Pos BS x 4. Mild tenderness to palpation of right lower back and right flank. MS: Extremities nontender, no edema, normal ROM Neurologic: Alert and oriented x 4.  GU: Neg CVAT.  PELVIC EXAM: Deferred  FHT 153 by doppler  LAB RESULTS Results for orders placed or performed during the hospital encounter of 10/02/18 (from the past 24 hour(s))  Urinalysis, Routine w reflex microscopic     Status: Abnormal   Collection Time: 10/02/18  4:22 PM  Result Value Ref Range   Color, Urine YELLOW YELLOW   APPearance CLEAR CLEAR   Specific Gravity, Urine 1.003 (L) 1.005 - 1.030   pH 6.0 5.0 - 8.0   Glucose, UA NEGATIVE NEGATIVE mg/dL   Hgb urine dipstick NEGATIVE NEGATIVE   Bilirubin Urine NEGATIVE NEGATIVE   Ketones, ur NEGATIVE NEGATIVE mg/dL   Protein, ur NEGATIVE NEGATIVE mg/dL   Nitrite NEGATIVE NEGATIVE   Leukocytes,Ua SMALL (A) NEGATIVE   RBC / HPF 0-5 0 - 5 RBC/hpf   WBC, UA 0-5 0 - 5 WBC/hpf   Bacteria, UA MANY (A) NONE SEEN   Squamous Epithelial / LPF 0-5 0 - 5    A/Positive/-- (02/13 1653)  IMAGING No results found.  MAU Management/MDM: Orders Placed This Encounter  Procedures  . Urinalysis, Routine w reflex microscopic  . CBC  . Discharge patient    Meds ordered this encounter  Medications  . cephALEXin (KEFLEX) 500 MG capsule    Sig: Take 1 capsule (500 mg total) by mouth 4 (four) times daily.    Dispense:  28 capsule    Refill:  0    Order Specific Question:   Supervising Provider    Answer:   Adam Phenix [[id number]]  . cephALEXin (KEFLEX) capsule 500 mg    Symptoms consistent with UTI and UA with small leukocytes, many bacteria, could indicate UTI. Urine culture sent.  No evidence of pyelonephritis with no CVA tenderness, no vomiting and no fever.  Pt on Macrobid x 48 hours with no improvement in symptoms.  CBC collected and pending.  Change abx to Keflex 500 mg QID x 7 days. Pt may take Tylenol and add ibuprofen for short course  (2-3 days) for discomfort.  Pt to follow up with [facility] with worsening symptoms and return to MAU with emergencies.  Pt discharged with strict infection precautions.  ASSESSMENT 1. UTI (urinary tract infection) during pregnancy, second trimester     PLAN Discharge home Allergies as of 10/02/2018   No Known Allergies     Medication List    STOP taking these medications   nitrofurantoin (macrocrystal-monohydrate) 100 MG capsule Commonly known as:  MACROBID     TAKE these medications   cephALEXin 500 MG capsule Commonly known as:  KEFLEX Take 1 capsule (500 mg total) by mouth 4 (  four) times daily.   PRENATAL VITAMIN PO Take by mouth daily.      Follow-up Information    [facility] Follow up.   Why:  As scheduled.  Contact information: [address] [place] [address] [phone]       [facility] Follow up.   Why:  As needed for emergencies Contact information: [address] [place] [address] [phone]          Sharen Counter Certified Nurse-Midwife 10/02/2018  5:44 PM

## 2018-10-02 NOTE — MAU Note (Signed)
Pt reports she has been taking meds for suspected UTI since Friday of last week.  Pt also reports frequent urination and pain in her lower right back that radiates around to her lower abd. Pt also reports night sweats for past 2 nights.   Pt denies vag. Bleeding or LOF.

## 2018-10-12 ENCOUNTER — Encounter: Payer: Self-pay | Admitting: *Deleted

## 2018-10-12 ENCOUNTER — Other Ambulatory Visit: Payer: Self-pay | Admitting: Women's Health

## 2018-10-12 DIAGNOSIS — Z363 Encounter for antenatal screening for malformations: Secondary | ICD-10-CM

## 2018-10-13 ENCOUNTER — Encounter: Payer: Self-pay | Admitting: Women's Health

## 2018-10-13 ENCOUNTER — Ambulatory Visit (INDEPENDENT_AMBULATORY_CARE_PROVIDER_SITE_OTHER): Payer: BC Managed Care – PPO

## 2018-10-13 ENCOUNTER — Other Ambulatory Visit: Payer: Self-pay

## 2018-10-13 ENCOUNTER — Ambulatory Visit (INDEPENDENT_AMBULATORY_CARE_PROVIDER_SITE_OTHER): Payer: BC Managed Care – PPO | Admitting: Women's Health

## 2018-10-13 VITALS — BP 98/60 | HR 59 | Wt 144.6 lb

## 2018-10-13 DIAGNOSIS — O2342 Unspecified infection of urinary tract in pregnancy, second trimester: Secondary | ICD-10-CM

## 2018-10-13 DIAGNOSIS — Z1379 Encounter for other screening for genetic and chromosomal anomalies: Secondary | ICD-10-CM

## 2018-10-13 DIAGNOSIS — Z363 Encounter for antenatal screening for malformations: Secondary | ICD-10-CM

## 2018-10-13 DIAGNOSIS — Z3A19 19 weeks gestation of pregnancy: Secondary | ICD-10-CM | POA: Diagnosis not present

## 2018-10-13 DIAGNOSIS — Z3402 Encounter for supervision of normal first pregnancy, second trimester: Secondary | ICD-10-CM

## 2018-10-13 LAB — POCT URINALYSIS DIPSTICK OB
Blood, UA: NEGATIVE
Glucose, UA: NEGATIVE
Ketones, UA: NEGATIVE
Leukocytes, UA: NEGATIVE
Nitrite, UA: NEGATIVE
POC,PROTEIN,UA: NEGATIVE

## 2018-10-13 MED ORDER — BLOOD PRESSURE CUFF MISC: 0 refills | Status: DC

## 2018-10-13 NOTE — Progress Notes (Signed)
[place] 19+4 wks,cephalic,posterior placenta gr 0,normal ovaries bilat,cx 4.9 cm,svp of fluid 4.4 cm,bilat complex choroid plexus cysts,LT 7.3 x 6.5 mm,RT 7.6 x 6.6 mm,EFW 301 g 45%,fhr 150 bpm,anatomy complete

## 2018-10-13 NOTE — Patient Instructions (Signed)
Zoe Armstrong, I greatly value your feedback.  If you receive a survey following your visit with us today, we appreciate you taking the time to fill it out.  Thanks, [name] Riggins Cisek, CNM, [facility]  [facility] M[facility]!!! It is now [facility] ([address] [place], [place] [address]) Entrance located off of [address] Free 24/7 valet parking   Home Blood Pressure Monitoring for Patients   Your provider has recommended that you check your blood pressure (BP) at least once a week at home. If you do not have a blood pressure cuff at home, one will be provided for you. Contact your provider if you have not received your monitor within 1 week.   Helpful Tips for Accurate Home Blood Pressure Checks  . Don't smoke, exercise, or drink caffeine 30 minutes before checking your BP . Use the restroom before checking your BP (a full bladder can raise your pressure) . Relax in a comfortable upright chair . Feet on the ground . Left arm resting comfortably on a flat surface at the level of your heart . Legs uncrossed . Back supported . Sit quietly and don't talk . Place the cuff on your bare arm . Adjust snuggly, so that only two fingertips can fit between your skin and the top of the cuff . Check 2 readings separated by at least one minute . Keep a log of your BP readings . For a visual, please reference this diagram: http://ccnc.care/bpdiagram  Provider Name: [facility]     Phone: [phone]  Zone 1: ALL CLEAR  Continue to monitor your symptoms:  . BP reading is less than 140 (top number) or less than 90 (bottom number)  . No right upper stomach pain . No headaches or seeing spots . No feeling nauseated or throwing up . No swelling in face and hands  Zone 2: CAUTION Call your doctor's office for any of the following:  . BP reading is greater than 140 (top number) or greater than 90 (bottom number)  . Stomach pain under your ribs in the middle or  right side . Headaches or seeing spots . Feeling nauseated or throwing up . Swelling in face and hands  Zone 3: EMERGENCY  Seek immediate medical care if you have any of the following:  . BP reading is greater than160 (top number) or greater than 110 (bottom number) . Severe headaches not improving with Tylenol . Serious difficulty catching your breath . Any worsening symptoms from Zone 2      Second Trimester of Pregnancy The second trimester is from week 14 through week 27 (months 4 through 6). The second trimester is often a time when you feel your best. Your body has adjusted to being pregnant, and you begin to feel better physically. Usually, morning sickness has lessened or quit completely, you may have more energy, and you may have an increase in appetite. The second trimester is also a time when the fetus is growing rapidly. At the end of the sixth month, the fetus is about 9 inches long and weighs about 1 pounds. You will likely begin to feel the baby move (quickening) between 16 and 20 weeks of pregnancy. Body changes during your second trimester Your body continues to go through many changes during your second trimester. The changes vary from woman to woman.  Your weight will continue to increase. You will notice your lower abdomen bulging out.  You may begin to get  stretch marks on your hips, abdomen, and breasts.  You may develop headaches that can be relieved by medicines. The medicines should be approved by your health care provider.  You may urinate more often because the fetus is pressing on your bladder.  You may develop or continue to have heartburn as a result of your pregnancy.  You may develop constipation because certain hormones are causing the muscles that push waste through your intestines to slow down.  You may develop hemorrhoids or swollen, bulging veins (varicose veins).  You may have back pain. This is caused by: ? Weight gain. ? Pregnancy hormones  that are relaxing the joints in your pelvis. ? A shift in weight and the muscles that support your balance.  Your breasts will continue to grow and they will continue to become tender.  Your gums may bleed and may be sensitive to brushing and flossing.  Dark spots or blotches (chloasma, mask of pregnancy) may develop on your face. This will likely fade after the baby is born.  A dark line from your belly button to the pubic area (linea nigra) may appear. This will likely fade after the baby is born.  You may have changes in your hair. These can include thickening of your hair, rapid growth, and changes in texture. Some women also have hair loss during or after pregnancy, or hair that feels dry or thin. Your hair will most likely return to normal after your baby is born.  What to expect at prenatal visits During a routine prenatal visit:  You will be weighed to make sure you and the fetus are growing normally.  Your blood pressure will be taken.  Your abdomen will be measured to track your baby's growth.  The fetal heartbeat will be listened to.  Any test results from the previous visit will be discussed.  Your health care provider may ask you:  How you are feeling.  If you are feeling the baby move.  If you have had any abnormal symptoms, such as leaking fluid, bleeding, severe headaches, or abdominal cramping.  If you are using any tobacco products, including cigarettes, chewing tobacco, and electronic cigarettes.  If you have any questions.  Other tests that may be performed during your second trimester include:  Blood tests that check for: ? Low iron levels (anemia). ? High blood sugar that affects pregnant women (gestational diabetes) between 85 and 28 weeks. ? Rh antibodies. This is to check for a protein on red blood cells (Rh factor).  Urine tests to check for infections, diabetes, or protein in the urine.  An ultrasound to confirm the proper growth and  development of the baby.  An amniocentesis to check for possible genetic problems.  Fetal screens for spina bifida and Down syndrome.  HIV (human immunodeficiency virus) testing. Routine prenatal testing includes screening for HIV, unless you choose not to have this test.  Follow these instructions at home: Medicines  Follow your health care provider's instructions regarding medicine use. Specific medicines may be either safe or unsafe to take during pregnancy.  Take a prenatal vitamin that contains at least 600 micrograms (mcg) of folic acid.  If you develop constipation, try taking a stool softener if your health care provider approves. Eating and drinking  Eat a balanced diet that includes fresh fruits and vegetables, whole grains, good sources of protein such as meat, eggs, or tofu, and low-fat dairy. Your health care provider will help you determine the amount of weight gain that  is right for you.  Avoid raw meat and uncooked cheese. These carry germs that can cause birth defects in the baby.  If you have low calcium intake from food, talk to your health care provider about whether you should take a daily calcium supplement.  Limit foods that are high in fat and processed sugars, such as fried and sweet foods.  To prevent constipation: ? Drink enough fluid to keep your urine clear or pale yellow. ? Eat foods that are high in fiber, such as fresh fruits and vegetables, whole grains, and beans. Activity  Exercise only as directed by your health care provider. Most women can continue their usual exercise routine during pregnancy. Try to exercise for 30 minutes at least 5 days a week. Stop exercising if you experience uterine contractions.  Avoid heavy lifting, wear low heel shoes, and practice good posture.  A sexual relationship may be continued unless your health care provider directs you otherwise. Relieving pain and discomfort  Wear a good support bra to prevent discomfort  from breast tenderness.  Take warm sitz baths to soothe any pain or discomfort caused by hemorrhoids. Use hemorrhoid cream if your health care provider approves.  Rest with your legs elevated if you have leg cramps or low back pain.  If you develop varicose veins, wear support hose. Elevate your feet for 15 minutes, 3-4 times a day. Limit salt in your diet. Prenatal Care  Write down your questions. Take them to your prenatal visits.  Keep all your prenatal visits as told by your health care provider. This is important. Safety  Wear your seat belt at all times when driving.  Make a list of emergency phone numbers, including numbers for family, friends, the hospital, and police and fire departments. General instructions  Ask your health care provider for a referral to a local prenatal education class. Begin classes no later than the beginning of month 6 of your pregnancy.  Ask for help if you have counseling or nutritional needs during pregnancy. Your health care provider can offer advice or refer you to specialists for help with various needs.  Do not use hot tubs, steam rooms, or saunas.  Do not douche or use tampons or scented sanitary pads.  Do not cross your legs for long periods of time.  Avoid cat litter boxes and soil used by cats. These carry germs that can cause birth defects in the baby and possibly loss of the fetus by miscarriage or stillbirth.  Avoid all smoking, herbs, alcohol, and unprescribed drugs. Chemicals in these products can affect the formation and growth of the baby.  Do not use any products that contain nicotine or tobacco, such as cigarettes and e-cigarettes. If you need help quitting, ask your health care provider.  Visit your dentist if you have not gone yet during your pregnancy. Use a soft toothbrush to brush your teeth and be gentle when you floss. Contact a health care provider if:  You have dizziness.  You have mild pelvic cramps, pelvic  pressure, or nagging pain in the abdominal area.  You have persistent nausea, vomiting, or diarrhea.  You have a bad smelling vaginal discharge.  You have pain when you urinate. Get help right away if:  You have a fever.  You are leaking fluid from your vagina.  You have spotting or bleeding from your vagina.  You have severe abdominal cramping or pain.  You have rapid weight gain or weight loss.  You have shortness of breath  with chest pain.  You notice sudden or extreme swelling of your face, hands, ankles, feet, or legs.  You have not felt your baby move in over an hour.  You have severe headaches that do not go away when you take medicine.  You have vision changes. Summary  The second trimester is from week 14 through week 27 (months 4 through 6). It is also a time when the fetus is growing rapidly.  Your body goes through many changes during pregnancy. The changes vary from woman to woman.  Avoid all smoking, herbs, alcohol, and unprescribed drugs. These chemicals affect the formation and growth your baby.  Do not use any tobacco products, such as cigarettes, chewing tobacco, and e-cigarettes. If you need help quitting, ask your health care provider.  Contact your health care provider if you have any questions. Keep all prenatal visits as told by your health care provider. This is important. This information is not intended to replace advice given to you by your health care provider. Make sure you discuss any questions you have with your health care provider. Document Released: 06/09/2001 Document Revised: 11/21/2015 Document Reviewed: 08/16/2012 Elsevier Interactive Patient Education  2017 Elsevier Inc.  Coronavirus (COVID-19) Are you at risk?  Are you at risk for the Coronavirus (COVID-19)?  To be considered HIGH RISK for Coronavirus (COVID-19), you have to meet the following criteria:  . Traveled to [place], [place], [place], [place] or [place]; or in the [place] to [place], [place], [place], or [place]; and have fever, cough, and shortness of breath within the last 2 weeks of travel OR . Been in close contact with a person diagnosed with COVID-19 within the last 2 weeks and have fever, cough, and shortness of breath . IF YOU DO NOT MEET THESE CRITERIA, YOU ARE CONSIDERED LOW RISK FOR COVID-19.  What to do if you are HIGH RISK for COVID-19?  Marland Kitchen If you are having a medical emergency, call 911. . Seek medical care right away. Before you go to a doctor's office, urgent care or emergency department, call ahead and tell them about your recent travel, contact with someone diagnosed with COVID-19, and your symptoms. You should receive instructions from your physician's office regarding next steps of care.  . When you arrive at healthcare provider, tell the healthcare staff immediately you have returned from visiting [place], [place], [place], [place] or [place]; or traveled in the [place] to [place], [place], [place], or [place]; in the last two weeks or you have been in close contact with a person diagnosed with COVID-19 in the last 2 weeks.   . Tell the health care staff about your symptoms: fever, cough and shortness of breath. . After you have been seen by a medical provider, you will be either: o Tested for (COVID-19) and discharged home on quarantine except to seek medical care if symptoms worsen, and asked to  - Stay home and avoid contact with others until you get your results (4-5 days)  - Avoid travel on public transportation if possible (such as bus, train, or airplane) or o Sent to the Emergency Department by EMS for evaluation, COVID-19 testing, and possible admission depending on your condition and test results.  What to do if you are LOW RISK for COVID-19?  Reduce your risk of any infection by using the same precautions used for avoiding the common cold or flu:  Marland Kitchen Wash your hands often with soap and warm water for  at  least 20 seconds.  If soap and water are not readily available, use an alcohol-based hand sanitizer with at least 60% alcohol.  . If coughing or sneezing, cover your mouth and nose by coughing or sneezing into the elbow areas of your shirt or coat, into a tissue or into your sleeve (not your hands). . Avoid shaking hands with others and consider head nods or verbal greetings only. . Avoid touching your eyes, nose, or mouth with unwashed hands.  . Avoid close contact with people who are sick. . Avoid places or events with large numbers of people in one location, like concerts or sporting events. . Carefully consider travel plans you have or are making. . If you are planning any travel outside or inside the [place], visit the CDC's Travelers' Health webpage for the latest health notices. . If you have some symptoms but not all symptoms, continue to monitor at home and seek medical attention if your symptoms worsen. . If you are having a medical emergency, call 911.   ADDITIONAL HEALTHCARE OPTIONS FOR PATIENTS  [facility] / e-Visit: [email or web address]         [facility]: [phone]  [facility]: [phone]                   [facility]: [phone]

## 2018-10-13 NOTE — Progress Notes (Signed)
TELEHEALTH VIRTUAL OBSTETRICS VISIT ENCOUNTER NOTE Patient name: Zoe Armstrong MRN [id number]  Date of birth: March 29, 1991  I connected with patient on 10/13/18 at  4:15 PM EDT by telephone Zoe Armstrong wouldn't load) and verified that I am speaking with the correct person using two identifiers. Due to COVID-19 recommendations, pt is not currently in our office.    I discussed the limitations, risks, security and privacy concerns of performing an evaluation and management service by telephone and the availability of in person appointments. I also discussed with the patient that there may be a patient responsible charge related to this service. The patient expressed understanding and agreed to proceed.  Chief Complaint:   Routine Prenatal Visit (u/s/ 2 nd IT)  History of Present Illness:   Zoe Armstrong is a 28 y.o. G1P0000 female at [redacted]w[redacted]d with an Estimated Date of Delivery: 03/05/19 being evaluated today for ongoing management of a low-risk pregnancy.  Today she reports no complaints. Contractions: Not present.  .  Movement: Absent. denies leaking of fluid. Review of Systems:   Pertinent items are noted in HPI Denies abnormal vaginal discharge w/ itching/odor/irritation, headaches, visual changes, shortness of breath, chest pain, abdominal pain, severe nausea/vomiting, or problems with urination or bowel movements unless otherwise stated above. Pertinent History Reviewed:  Reviewed past medical,surgical, social, obstetrical and family history.  Reviewed problem list, medications and allergies. Physical Assessment:   Vitals:   10/13/18 1616  BP: 98/60  Pulse: (!) 59  Weight: 144 lb 9.6 oz (65.6 kg)  Body mass index is 25.61 kg/m.        Physical Examination:   General:  Alert, oriented and cooperative.   Mental Status: Normal mood and affect perceived. Normal judgment and thought content.  Rest of physical exam deferred due to type of encounter  Today's anatomy US 19+4 wks,cephalic,posterior  placenta gr 0,normal ovaries bilat,cx 4.9 cm,svp of fluid 4.4 cm,bilat complex choroid plexus cysts,LT 7.3 x 6.5 mm,RT 7.6 x 6.6 mm,EFW 301 g 45%,fhr 150 bpm,anatomy complete   Results for orders placed or performed in visit on 10/13/18 (from the past 24 hour(s))  POC Urinalysis Dipstick OB   Collection Time: 10/13/18  4:20 PM  Result Value Ref Range   Color, UA     Clarity, UA     Glucose, UA Negative Negative   Bilirubin, UA     Ketones, UA neg    Spec Grav, UA     Blood, UA neg    pH, UA     POC,PROTEIN,UA Negative Negative, Trace, Small (1+), Moderate (2+), Large (3+), 4+   Urobilinogen, UA     Nitrite, UA neg    Leukocytes, UA Negative Negative   Appearance     Odor      Assessment & Plan:  1) Pregnancy G1P0000 at [redacted]w[redacted]d with an Estimated Date of Delivery: 03/05/19   2) Recent UTI, urine cx poc today  3) Fetal bilateral CPC>7+mm, doing 2nd IT today, plan f/u u/s @ 28wks. Discussed w/ pt and gave printed info   Meds:  Meds ordered this encounter  Medications  . Blood Pressure Monitoring (BLOOD PRESSURE CUFF) MISC    Sig: For regular bp monitoring at home    Dispense:  1 each    Refill:  0    Order Specific Question:   Supervising Provider    Answer:   Lazaro Arms [[id number]]    Labs/procedures today: anatomy u/s, 2nd IT  Plan:  Continue routine obstetrical care. Does not have  home BP cuff-rx sent. Check bp weekly, let us know if >140/90.   Reviewed: Preterm labor symptoms and general obstetric precautions including but not limited to vaginal bleeding, contractions, leaking of fluid and fetal movement were reviewed in detail with the patient. The patient was advised to call back or seek an in-person office evaluation/[facility] for any urgent or concerning symptoms. All questions were answered. Please refer to After Visit Summary for other counseling recommendations.   I provided 10 minutes of non-face-to-face time during this encounter.   Follow-up: Return in about 4 weeks (around 11/10/2018) for LROB webex.  Orders Placed This Encounter  Procedures  . Urine Culture  . INTEGRATED 2  . POC Urinalysis Dipstick OB   Cheral MarkerKimberly R Nga Rabon CNM, [facility] 10/13/2018 4:58 PM

## 2018-10-15 LAB — INTEGRATED 2
AFP MoM: 0.72
Alpha-Fetoprotein: 37.1 ng/mL
Crown Rump Length: 75.1 mm
DIA MoM: 1.16
DIA Value: 217.8 pg/mL
Estriol, Unconjugated: 1.7 ng/mL
Gest. Age on Collection Date: 13.4 weeks
Gestational Age: 19.4 weeks
Maternal Age at EDD: 29.4 yr
Nuchal Translucency (NT): 1.8 mm
Nuchal Translucency MoM: 1.04
Number of Fetuses: 1
PAPP-A MoM: 1.11
PAPP-A Value: 1570.2 ng/mL
Test Results:: NEGATIVE
Weight: 142 [lb_av]
Weight: 142 [lb_av]
hCG MoM: 2.46
hCG Value: 56 IU/mL
uE3 MoM: 0.94

## 2018-10-15 LAB — URINE CULTURE: Organism ID, Bacteria: NO GROWTH

## 2018-10-28 ENCOUNTER — Other Ambulatory Visit: Payer: Self-pay | Admitting: Women's Health

## 2018-10-28 ENCOUNTER — Telehealth: Payer: Self-pay | Admitting: Women's Health

## 2018-10-28 MED ORDER — NITROFURANTOIN MONOHYD MACRO 100 MG PO CAPS: 100.0000 mg | ORAL_CAPSULE | Freq: Two times a day (BID) | ORAL | 0 refills | Status: DC

## 2018-10-28 NOTE — Telephone Encounter (Signed)
LM, attempting to contact pt about her mychart message. IF anything worsens over weekend, go to [facility].  [name], CNM, WHNP-BC 10/28/2018 1:24 PM

## 2018-11-09 ENCOUNTER — Encounter: Payer: Self-pay | Admitting: *Deleted

## 2018-11-10 ENCOUNTER — Encounter: Payer: Self-pay | Admitting: Women's Health

## 2018-11-10 ENCOUNTER — Ambulatory Visit (INDEPENDENT_AMBULATORY_CARE_PROVIDER_SITE_OTHER): Payer: BC Managed Care – PPO | Admitting: Women's Health

## 2018-11-10 ENCOUNTER — Other Ambulatory Visit: Payer: Self-pay

## 2018-11-10 VITALS — BP 100/54 | HR 88 | Wt 149.2 lb

## 2018-11-10 DIAGNOSIS — Z3A23 23 weeks gestation of pregnancy: Secondary | ICD-10-CM

## 2018-11-10 DIAGNOSIS — O350XX0 Maternal care for (suspected) central nervous system malformation in fetus, not applicable or unspecified: Secondary | ICD-10-CM

## 2018-11-10 DIAGNOSIS — Z3402 Encounter for supervision of normal first pregnancy, second trimester: Secondary | ICD-10-CM

## 2018-11-10 DIAGNOSIS — O3503X0 Maternal care for (suspected) central nervous system malformation or damage in fetus, choroid plexus cysts, not applicable or unspecified: Secondary | ICD-10-CM

## 2018-11-10 NOTE — Progress Notes (Signed)
   TELEHEALTH VIRTUAL OBSTETRICS VISIT ENCOUNTER NOTE Patient name: Zoe Armstrong MRN [id number]  Date of birth: 1991/04/30  I connected with patient on 11/10/18 at  3:30 PM EDT by [employer] and verified that I am speaking with the correct person using two identifiers. Due to COVID-19 recommendations, pt is not currently in our office.    I discussed the limitations, risks, security and privacy concerns of performing an evaluation and management service by telephone and the availability of in person appointments. I also discussed with the patient that there may be a patient responsible charge related to this service. The patient expressed understanding and agreed to proceed.  Chief Complaint:   Routine Prenatal Visit (back pain)  History of Present Illness:   Zoe Armstrong is a 28 y.o. G1P0000 female at [redacted]w[redacted]d with an Estimated Date of Delivery: 03/05/19 being evaluated today for ongoing management of a low-risk pregnancy.  Today she reports low back pain. Contractions: Not present. Vag. Bleeding: None.  Movement: Present. denies leaking of fluid. Review of Systems:   Pertinent items are noted in HPI Denies abnormal vaginal discharge w/ itching/odor/irritation, headaches, visual changes, shortness of breath, chest pain, abdominal pain, severe nausea/vomiting, or problems with urination or bowel movements unless otherwise stated above. Pertinent History Reviewed:  Reviewed past medical,surgical, social, obstetrical and family history.  Reviewed problem list, medications and allergies. Physical Assessment:   Vitals:   11/10/18 1545  BP: (!) 100/54  Pulse: 88  Weight: 149 lb 3.2 oz (67.7 kg)  Body mass index is 26.43 kg/m.        Physical Examination:   General:  Alert, oriented and cooperative.   Mental Status: Normal mood and affect perceived. Normal judgment and thought content.  Rest of physical exam deferred due to type of encounter  No results found for this or any previous visit (from the  past 24 hour(s)).  Assessment & Plan:  1) Pregnancy G1P0000 at [redacted]w[redacted]d with an Estimated Date of Delivery: 03/05/19   2) Low back pain, gave printed prevention/relief measures   3) Fetal bilateral CPC> 7+mm, f/u u/s next visit   Meds: No orders of the defined types were placed in this encounter.   Labs/procedures today: none  Plan:  Continue routine obstetrical care. Has BP cuff. Check bp weekly, let us know if >140/90.   Reviewed: Preterm labor symptoms and general obstetric precautions including but not limited to vaginal bleeding, contractions, leaking of fluid and fetal movement were reviewed in detail with the patient. The patient was advised to call back or seek an in-person office evaluation/go to [facility]U at [facility] for any urgent or concerning symptoms. All questions were answered. Please refer to After Visit Summary for other counseling recommendations.   I provided 15 minutes of non-face-to-face time during this encounter.  Follow-up: Return in about 4 weeks (around 12/08/2018) for LROB, PN2, US:OB F/U .  Orders Placed This Encounter  Procedures  . US OB Follow Up   [name] CNM, [facility] 11/10/2018 4:07 PM

## 2018-11-10 NOTE — Patient Instructions (Signed)
Zoe Armstrong, I greatly value your feedback.  If you receive a survey following your visit with us today, we appreciate you taking the time to fill it out.  Thanks, [name] Booker, CNM, WHNP-BC   You will have your sugar test next visit.  Please do not eat or drink anything after midnight the night before you come, not even water.  You will be here for at least two hours.     [facility] is now [facility] ([address] [place], [place] [address]) Entrance located off of [address] Free 24/7 valet parking   Home Blood Pressure Monitoring for Patients   Your provider has recommended that you check your blood pressure (BP) at least once a week at home. If you do not have a blood pressure cuff at home, one will be provided for you. Contact your provider if you have not received your monitor within 1 week.   Helpful Tips for Accurate Home Blood Pressure Checks  . Don't smoke, exercise, or drink caffeine 30 minutes before checking your BP . Use the restroom before checking your BP (a full bladder can raise your pressure) . Relax in a comfortable upright chair . Feet on the ground . Left arm resting comfortably on a flat surface at the level of your heart . Legs uncrossed . Back supported . Sit quietly and don't talk . Place the cuff on your bare arm . Adjust snuggly, so that only two fingertips can fit between your skin and the top of the cuff . Check 2 readings separated by at least one minute . Keep a log of your BP readings . For a visual, please reference this diagram: http://ccnc.care/bpdiagram  Provider Name: [facility]     Phone: [phone]  Zone 1: ALL CLEAR  Continue to monitor your symptoms:  . BP reading is less than 140 (top number) or less than 90 (bottom number)  . No right upper stomach pain . No headaches or seeing spots . No feeling nauseated or throwing up . No swelling in face and hands  Zone 2:  CAUTION Call your doctor's office for any of the following:  . BP reading is greater than 140 (top number) or greater than 90 (bottom number)  . Stomach pain under your ribs in the middle or right side . Headaches or seeing spots . Feeling nauseated or throwing up . Swelling in face and hands  Zone 3: EMERGENCY  Seek immediate medical care if you have any of the following:  . BP reading is greater than160 (top number) or greater than 110 (bottom number) . Severe headaches not improving with Tylenol . Serious difficulty catching your breath . Any worsening symptoms from Zone 2   Call the office [phone]) or go to [facility] if:  You begin to have strong, frequent contractions  Your water breaks.  Sometimes it is a big gush of fluid, sometimes it is just a trickle that keeps getting your panties wet or running down your legs  You have vaginal bleeding.  It is normal to have a small amount of spotting if your cervix was checked.   You don't feel your baby moving like normal.  If you don't, get you something to eat and drink and lay down and focus on feeling your baby move.   If your baby is still not moving like normal, you should call the office or go to [facility].  For your lower back pain you may:  Purchase a pregnancy belt from [employer], [employer], [employer], etc and wear it while you are up and about  Take warm baths  Use a heating pad to your lower back for no longer than 20 minutes at a time, and do not place near abdomen  Take tylenol as needed. Please follow directions on the bottle  Kinesiology tape (can get from sporting goods store), google how to tape belly for pregnancy    Second Trimester of Pregnancy The second trimester is from week 13 through week 28, months 4 through 6. The second trimester is often a time when you feel your best. Your body has also adjusted to being pregnant, and you begin to feel better physically. Usually, morning  sickness has lessened or quit completely, you may have more energy, and you may have an increase in appetite. The second trimester is also a time when the fetus is growing rapidly. At the end of the sixth month, the fetus is about 9 inches long and weighs about 1 pounds. You will likely begin to feel the baby move (quickening) between 18 and 20 weeks of the pregnancy. BODY CHANGES Your body goes through many changes during pregnancy. The changes vary from woman to woman.   Your weight will continue to increase. You will notice your lower abdomen bulging out.  You may begin to get stretch marks on your hips, abdomen, and breasts.  You may develop headaches that can be relieved by medicines approved by your health care provider.  You may urinate more often because the fetus is pressing on your bladder.  You may develop or continue to have heartburn as a result of your pregnancy.  You may develop constipation because certain hormones are causing the muscles that push waste through your intestines to slow down.  You may develop hemorrhoids or swollen, bulging veins (varicose veins).  You may have back pain because of the weight gain and pregnancy hormones relaxing your joints between the bones in your pelvis and as a result of a shift in weight and the muscles that support your balance.  Your breasts will continue to grow and be tender.  Your gums may bleed and may be sensitive to brushing and flossing.  Dark spots or blotches (chloasma, mask of pregnancy) may develop on your face. This will likely fade after the baby is born.  A dark line from your belly button to the pubic area (linea nigra) may appear. This will likely fade after the baby is born.  You may have changes in your hair. These can include thickening of your hair, rapid growth, and changes in texture. Some women also have hair loss during or after pregnancy, or hair that feels dry or thin. Your hair will most likely return to  normal after your baby is born. WHAT TO EXPECT AT YOUR PRENATAL VISITS During a routine prenatal visit:  You will be weighed to make sure you and the fetus are growing normally.  Your blood pressure will be taken.  Your abdomen will be measured to track your baby's growth.  The fetal heartbeat will be listened to.  Any test results from the previous visit will be discussed. Your health care provider may ask you:  How you are feeling.  If you are feeling the baby move.  If you have had any abnormal symptoms, such as leaking fluid, bleeding, severe headaches, or abdominal cramping.  If you have any questions. Other tests that  may be performed during your second trimester include:  Blood tests that check for:  Low iron levels (anemia).  Gestational diabetes (between 24 and 28 weeks).  Rh antibodies.  Urine tests to check for infections, diabetes, or protein in the urine.  An ultrasound to confirm the proper growth and development of the baby.  An amniocentesis to check for possible genetic problems.  Fetal screens for spina bifida and Down syndrome. HOME CARE INSTRUCTIONS   Avoid all smoking, herbs, alcohol, and unprescribed drugs. These chemicals affect the formation and growth of the baby.  Follow your health care provider's instructions regarding medicine use. There are medicines that are either safe or unsafe to take during pregnancy.  Exercise only as directed by your health care provider. Experiencing uterine cramps is a good sign to stop exercising.  Continue to eat regular, healthy meals.  Wear a good support bra for breast tenderness.  Do not use hot tubs, steam rooms, or saunas.  Wear your seat belt at all times when driving.  Avoid raw meat, uncooked cheese, cat litter boxes, and soil used by cats. These carry germs that can cause birth defects in the baby.  Take your prenatal vitamins.  Try taking a stool softener (if your health care provider  approves) if you develop constipation. Eat more high-fiber foods, such as fresh vegetables or fruit and whole grains. Drink plenty of fluids to keep your urine clear or pale yellow.  Take warm sitz baths to soothe any pain or discomfort caused by hemorrhoids. Use hemorrhoid cream if your health care provider approves.  If you develop varicose veins, wear support hose. Elevate your feet for 15 minutes, 3-4 times a day. Limit salt in your diet.  Avoid heavy lifting, wear low heel shoes, and practice good posture.  Rest with your legs elevated if you have leg cramps or low back pain.  Visit your dentist if you have not gone yet during your pregnancy. Use a soft toothbrush to brush your teeth and be gentle when you floss.  A sexual relationship may be continued unless your health care provider directs you otherwise.  Continue to go to all your prenatal visits as directed by your health care provider. SEEK MEDICAL CARE IF:   You have dizziness.  You have mild pelvic cramps, pelvic pressure, or nagging pain in the abdominal area.  You have persistent nausea, vomiting, or diarrhea.  You have a bad smelling vaginal discharge.  You have pain with urination. SEEK IMMEDIATE MEDICAL CARE IF:   You have a fever.  You are leaking fluid from your vagina.  You have spotting or bleeding from your vagina.  You have severe abdominal cramping or pain.  You have rapid weight gain or loss.  You have shortness of breath with chest pain.  You notice sudden or extreme swelling of your face, hands, ankles, feet, or legs.  You have not felt your baby move in over an hour.  You have severe headaches that do not go away with medicine.  You have vision changes. Document Released: 06/09/2001 Document Revised: 06/20/2013 Document Reviewed: 08/16/2012 [facility] Patient Information 2015 [place], [place]. This information is not intended to replace advice given to you by your health care provider. Make  sure you discuss any questions you have with your health care provider.     AREA PEDIATRIC/FAMILY PRACTICE PHYSICIANS  [facility] [address] [place], [place] [address] Phone - [phone]   Fax - [phone]  Ree Kida  [facility] [address] [place], [place]  [address] Phone - [phone]   Fax - [phone]  [facility] [address] [place], [place]  [address] Phone - [phone]   Fax - [phone]  [facility] [address] [place], [place]  [address] Phone - [phone]   Fax - [phone]  [facility] [address] [place], [place]  [address] Phone - [phone]   Fax - [phone]  [facility] [address] [place], [place]  [address] Phone - [phone]   Fax - [phone]  [facility]  [address] [place], [place]  [address] Phone - [phone]   Fax - [phone]  [facility] AT [facility] [address] [place] [place], [place]  [address] Phone - [phone]   Fax - [phone]  [facility] AT [facility] [address] [place], [place]  [address] Phone - [phone]   Fax - [phone] [facility] [place] [address] [place], [place]  [address] Phone - [phone]   Fax - [phone]  [facility] [address] [place], [place]  [address] Phone - [phone]   Fax - [phone]  [facility] [address] [place], [place]  [address] Phone - [phone]   Fax - [phone]  [facility] [place] [address] [place], [place]  [address] Phone - [phone]   Fax - [phone]  [facility] [address], [place], [place]  [address] Phone - [phone]  [facility] [address] [place], [place]  [address] Phone -  [phone]   Fax - [phone]  [facility] [address] [place], [place]  [address] Phone - [phone]   Fax - [phone]  [facility] [address] [place], [place]  [address] Phone - [phone]   Fax - [phone]  [facility] [address] [place], [place]  [address] Phone - [phone]   Fax - [phone]   [facility] [address] [place], [place]  [address] Phone - [phone]   Fax - [phone]  [facility] [address] [place], [place]  [address] Phone - [phone]   Fax - [phone]  DAVID RUBIN [address] [place], [place]  [address] Phone - [phone]   Fax - [phone]  [facility] [address] [place], [place]  [address] Phone - [phone]   Fax - [phone]  [facility] Al[facility] Chyle [address] [place], [place]  [address] Phone - [phone]   Fax - [phone] Gerarda Fraction [address] [place], [place]  [address] Phone - [phone]   Fax - [phone]  [facility] [address] [place], [place]  [address] Phone - [phone]   Fax - [phone]  [facility] - [place] [address] [place], [place]  [address] Phone - [phone]   Fax - [phone]  Coronavirus (COVID-19) Are you at risk?  Are you at risk for the Coronavirus (COVID-19)?  To be considered HIGH RISK for Coronavirus (COVID-19), you have to meet the following criteria:  . Traveled to [place], [place], [place], [place] or [place]; or in the [place] to [place], [place], [place], or [place]; and have fever, cough, and shortness  of breath within the last 2 weeks of travel OR . Been in close contact with a person diagnosed with COVID-19 within the last 2 weeks and have fever, cough, and shortness of breath . IF YOU DO NOT  MEET THESE CRITERIA, YOU ARE CONSIDERED LOW RISK FOR COVID-19.  What to do if you are HIGH RISK for COVID-19?  Marland Kitchen If you are having a medical emergency, call 911. . Seek medical care right away. Before you go to a doctor's office, urgent care or emergency department, call ahead and tell them about your recent travel, contact with someone diagnosed with COVID-19, and your symptoms. You should receive instructions from your physician's office regarding next steps of care.  . When you arrive at healthcare provider, tell the healthcare staff immediately you have returned from visiting [place], [place], [place], [place] or [place]; or traveled in the [place] to [place], [place], [place], or [place]; in the last two weeks or you have been in close contact with a person diagnosed with COVID-19 in the last 2 weeks.   . Tell the health care staff about your symptoms: fever, cough and shortness of breath. . After you have been seen by a medical provider, you will be either: o Tested for (COVID-19) and discharged home on quarantine except to seek medical care if symptoms worsen, and asked to  - Stay home and avoid contact with others until you get your results (4-5 days)  - Avoid travel on public transportation if possible (such as bus, train, or airplane) or o Sent to the Emergency Department by EMS for evaluation, COVID-19 testing, and possible admission depending on your condition and test results.  What to do if you are LOW RISK for COVID-19?  Reduce your risk of any infection by using the same precautions used for avoiding the common cold or flu:  Marland Kitchen Wash your hands often with soap and warm water for at least 20 seconds.  If soap and water are not readily available, use an alcohol-based hand sanitizer with at least 60% alcohol.  . If coughing or sneezing, cover your mouth and nose by coughing or sneezing into the elbow areas of your shirt or coat, into a tissue or into your sleeve (not your  hands). . Avoid shaking hands with others and consider head nods or verbal greetings only. . Avoid touching your eyes, nose, or mouth with unwashed hands.  . Avoid close contact with people who are sick. . Avoid places or events with large numbers of people in one location, like concerts or sporting events. . Carefully consider travel plans you have or are making. . If you are planning any travel outside or inside the [place], visit the CDC's Travelers' Health webpage for the latest health notices. . If you have some symptoms but not all symptoms, continue to monitor at home and seek medical attention if your symptoms worsen. . If you are having a medical emergency, call 911.   ADDITIONAL HEALTHCARE OPTIONS FOR PATIENTS  [facility] / e-Visit: [email or web address]         [facility]: [phone]  [facility]: [phone]                   [facility]: [phone]

## 2018-11-22 ENCOUNTER — Other Ambulatory Visit: Payer: Self-pay

## 2018-11-22 ENCOUNTER — Telehealth: Payer: Self-pay | Admitting: *Deleted

## 2018-11-22 ENCOUNTER — Other Ambulatory Visit: Payer: BC Managed Care – PPO

## 2018-11-22 ENCOUNTER — Other Ambulatory Visit: Payer: Self-pay | Admitting: Women's Health

## 2018-11-22 VITALS — Temp 98.1°F

## 2018-11-22 DIAGNOSIS — R8290 Unspecified abnormal findings in urine: Secondary | ICD-10-CM

## 2018-11-22 DIAGNOSIS — R350 Frequency of micturition: Secondary | ICD-10-CM

## 2018-11-22 DIAGNOSIS — N390 Urinary tract infection, site not specified: Secondary | ICD-10-CM

## 2018-11-22 LAB — POCT URINALYSIS DIPSTICK OB
Glucose, UA: NEGATIVE
Nitrite, UA: POSITIVE

## 2018-11-22 MED ORDER — CEPHALEXIN 500 MG PO CAPS: 500.0000 mg | ORAL_CAPSULE | Freq: Every day | ORAL | 2 refills | Status: DC

## 2018-11-22 MED ORDER — CEPHALEXIN 500 MG PO CAPS: 500.0000 mg | ORAL_CAPSULE | Freq: Four times a day (QID) | ORAL | 0 refills | Status: DC

## 2018-11-22 NOTE — Progress Notes (Signed)
Pt having urinary frequency and abnormal odor with urine. I spoke with Selena Batten, CNM and she advised to check temp and will send urine for culture. Temp is 98.1. Back is hurting. Selena Batten will order antibiotic. JSY

## 2018-11-22 NOTE — Telephone Encounter (Signed)
Please order antibiotic for UTI. Thanks!! JSY

## 2018-11-25 LAB — URINE CULTURE

## 2018-12-09 ENCOUNTER — Encounter: Payer: Self-pay | Admitting: *Deleted

## 2018-12-12 ENCOUNTER — Other Ambulatory Visit: Payer: Self-pay

## 2018-12-12 ENCOUNTER — Telehealth: Payer: Self-pay | Admitting: Obstetrics and Gynecology

## 2018-12-12 ENCOUNTER — Other Ambulatory Visit: Payer: BC Managed Care – PPO

## 2018-12-12 ENCOUNTER — Ambulatory Visit (INDEPENDENT_AMBULATORY_CARE_PROVIDER_SITE_OTHER): Payer: BC Managed Care – PPO

## 2018-12-12 ENCOUNTER — Ambulatory Visit (INDEPENDENT_AMBULATORY_CARE_PROVIDER_SITE_OTHER): Payer: BC Managed Care – PPO | Admitting: Obstetrics and Gynecology

## 2018-12-12 ENCOUNTER — Encounter: Payer: BC Managed Care – PPO | Admitting: Advanced Practice Midwife

## 2018-12-12 VITALS — BP 103/64 | HR 70 | Wt 161.8 lb

## 2018-12-12 DIAGNOSIS — Z1389 Encounter for screening for other disorder: Secondary | ICD-10-CM

## 2018-12-12 DIAGNOSIS — O350XX0 Maternal care for (suspected) central nervous system malformation in fetus, not applicable or unspecified: Secondary | ICD-10-CM | POA: Diagnosis not present

## 2018-12-12 DIAGNOSIS — O3503X0 Maternal care for (suspected) central nervous system malformation or damage in fetus, choroid plexus cysts, not applicable or unspecified: Secondary | ICD-10-CM

## 2018-12-12 DIAGNOSIS — Z3A28 28 weeks gestation of pregnancy: Secondary | ICD-10-CM | POA: Diagnosis not present

## 2018-12-12 DIAGNOSIS — Z3402 Encounter for supervision of normal first pregnancy, second trimester: Secondary | ICD-10-CM

## 2018-12-12 DIAGNOSIS — O2343 Unspecified infection of urinary tract in pregnancy, third trimester: Secondary | ICD-10-CM

## 2018-12-12 DIAGNOSIS — Z3403 Encounter for supervision of normal first pregnancy, third trimester: Secondary | ICD-10-CM

## 2018-12-12 DIAGNOSIS — Z331 Pregnant state, incidental: Secondary | ICD-10-CM

## 2018-12-12 LAB — POCT URINALYSIS DIPSTICK OB
Blood, UA: NEGATIVE
Glucose, UA: NEGATIVE
Ketones, UA: NEGATIVE
Leukocytes, UA: NEGATIVE
Nitrite, UA: NEGATIVE
POC,PROTEIN,UA: NEGATIVE

## 2018-12-12 MED ORDER — NITROFURANTOIN MACROCRYSTAL 100 MG PO CAPS: 100.0000 mg | ORAL_CAPSULE | Freq: Every day | ORAL | 3 refills | Status: DC

## 2018-12-12 NOTE — Telephone Encounter (Signed)
Chart reviewed, pt will stay on Keflex HS as supresson instead of Macrodantin due to back pain component of UTI's.

## 2018-12-12 NOTE — Patient Instructions (Signed)
[facility] [place] °Call to Register: [phone] or [phone]   or   Register Online: [email or web address] °THESE CLASSES FILL UP VERY QUICKLY, SO SIGN UP AS SOON AS YOU CAN!!! °Please visit [facility] pregnancy website at [email or web address] ° °Childbirth Classes  °Option 1: Birth & Baby Series °? Series of 3 weekly classes, on the same day of the week (can choose Mon-Thurs) from 6-9pm °? Helps you and your support person prepare for childbirth °? Reviews newborn care, labor & birth, cesarean birth, pain management, and comfort techniques °? Cost: $60 per couple for insured or self-pay, $30 per couple for Medicaid ° °Option 2: Weekend Birth & Baby °? This class is a weekend version of our Birth & Baby series.  It is designed for parents who have a difficult time fitting several weeks of classes into their schedule.   °? Covers the care of your newborn and the basics of labor and childbirth °? Friday 6:30pm-8:30pm Saturday 9am-4pm, includes lunch for you and your partner  °? Cost: $75 per couple for insured or self-pay, $30 per couple for Medicaid ° °Option 3: Natural Childbirth °? This series of 5 weekly classes is for expectant parents who want to learn and practice natural methods of coping with the process of labor and childbirth.  Can choose Mon or Tues, 7-9pm.   °? Covers relaxation, breathing, massage, visualization, role of the partner, and helpful positioning °? Participants learn how to be confident in their body's ability to give birth. Class empowers and helps parents make informed decisions about care. Includes discussion that will help new parents transition into the immediate postpartum period.  °? Cost: $75 per couple for insured or self-pay, $30 per couple for Medicaid ° °Option 4: Online Birth & Baby °? This online class offers you the freedom to complete a Birth & Baby series in the comfort of your own home.  The flexibility of this option allows you to review  sections at your own pace, at times convenient to you and your support people.  It includes additional video information, animations, quizzes and extended activities. Get organized with helpful eClass tools, checklists, and trackers.  °? Cost: $60 for 60 days of online access °    °                                                                       °Other Available Classes ° °Baby & Me °Enjoy this time to discuss newborn & infant parenting topics and family adjustment issues with other new mothers in a relaxed environment. Each week brings a new speaker or baby-centered activity. We encourage mothers and their babies (birth to crawling) to join us. You are welcome to visit this group even if you haven't delivered yet! It's wonderful to make new friends early and watch other moms interact with their babies. No registration or fee.  °Big Brother/Big Sister °Let your children share in the joy of a new brother or sister in this special class designed just for them. Discussion includes how families care for babies: swaddling, holding, diapering, safety, as well as how they can be helpful in their new role. This class is designed for children ages 2 to 6, but any age is   welcome. Please register each child individually. $5 °Breastfeeding Support Group °This group is a mother-to-mother support circle where moms have the opportunity to share their breastfeeding experiences. A Breastfeeding Support nurse is present for questions and concerns. An infant scale is available for weight checks. No fee or registration.  °Breastfeeding Your Baby °Breastfeeding is a special time for mother and child. This class will help you feel ready to begin this important relationship. Your partner is encouraged to attend with you. Learn what to expect and feel more confident in the first days of breastfeeding your newborn. This class also addresses the most common fears and challenges of breastfeeding during the first few weeks, months, and  beyond. $30 per couple °Caring for Baby °This class is for expectant and adoptive parents who want to learn and practice the most up-to-date newborn care for their babies. Focus is on birth through first six weeks of life. Topics include feeding, bathing, diapering, crying, umbilical cord care, circumcision care and safe sleep. Parents learn how to recognize symptoms of illness and when to call the pediatrician. Register only the mom-to-be and your partner can plan to come with you. (*Note: This class is included in the Birth & Baby series and the Weekend Birth & Baby classes.) $10 per couple °Comfort Techniques & Tour °This 2-hour interactive class is designed for those who either do not wish to take the Birth & Baby series or for those who prefer our online childbirth class, but don't want to miss the opportunity to learn and practice hands-on techniques. These skills can help relieve some of the discomfort of labor and encourage your baby to rotate toward the best position for birth. You and your partner will be able to try a variety of labor positions with birth balls and rebozos as well as practice breathing, relaxation, and visual techniques. $20 per couple °Daddy Boot Camp °This course offers Dads-to-be the tools and knowledge needed to feel confident on their journey to becoming new fathers. Experienced dads, who have been trained as coaches, teach dads-to-be how to hold, comfort, diapers, swaddle and play with their infant while being able to support the new mom as well. $25 °Grandparent Love °Expecting a grandbaby? Learn about the latest infant care and safety recommendations and ways to support your own child as he or she transitions into the parenting role. $10 per person °Infant and Child CPR °Parents, grandparents, babysitters, and friends learn Cardio-Pulmonary Resuscitation skills for infants and children. You will also learn how to treat both conscious and unconscious choking infants and children.  Register each participant individually. (Note: This Family & Friends program does not offer certification.) $20 per person °Marvelous Multiples °Expecting twins, triplets, or more? This free 2-hour class covers the differences in labor, birth, parenting, and breastfeeding issues that face multiples' parents.  °Maternity Care Center Virtual Tour ° Online virtual tour of the new [facility] [place] ° °Mom Talk °This free mom-led group offers support and connection to mothers as they journey through the adjustments and struggles of that sometimes overwhelming first year after the birth of a child. A member of our staff will be present to share resources and additional support if needed, as you care for yourself and baby. You are welcome to visit this group before you deliver! It's wonderful to meet new friends early and watch other moms interact with their babies.  °Waterbirth Class °Interested in a waterbirth? This free informational class will help you discover whether   waterbirth is the right fit for you and is required if you are planning a waterbirth. Education about waterbirth itself, supplies you may need, and what you may need from your support team is included in this class. Partners are encouraged to come. °  ° °

## 2018-12-12 NOTE — Progress Notes (Signed)
Patient ID: Zoe Armstrong, female   DOB: 1990-10-04, 28 y.o.   MRN: [id number]    LOW-RISK PREGNANCY VISIT Patient name: Zoe Armstrong MRN [id number]  Date of birth: 31-Jan-1991 Chief Complaint:   Routine Prenatal Visit (PN2 [place])  History of Present Illness:   Zoe Armstrong is a 28 y.o. G48P0000 female at [redacted]w[redacted]d with an Estimated Date of Delivery: 03/05/19 being seen today for ongoing management of a low-risk pregnancy. Has constant UTI, recently had one about a week ago. Started noticing UTI after sexual activity, stopped drinking most caffeine and sweets only drinking water and cranberry juice. She hurts into her back only on the right side never on the left. Just finished keflex treatment. She has used both keflex and Macrobid. Today she reports no complaints. Contractions: Not present. Vag. Bleeding: None.  Movement: Present. denies leaking of fluid. Review of Systems:   Pertinent items are noted in HPI Denies abnormal vaginal discharge w/ itching/odor/irritation, headaches, visual changes, shortness of breath, chest pain, abdominal pain, severe nausea/vomiting, or problems with urination or bowel movements unless otherwise stated above. Pertinent History Reviewed:  Reviewed past medical,surgical, social, obstetrical and family history.  Reviewed problem list, medications and allergies. Physical Assessment:   Vitals:   12/12/18 1004  BP: 103/64  Pulse: 70  Weight: 161 lb 12.8 oz (73.4 kg)  Body mass index is 28.66 kg/m.        Physical Examination:   General appearance: Well appearing, and in no distress  Mental status: Alert, oriented to person, place, and time  Skin: Warm & dry  Cardiovascular: Normal heart rate noted  Respiratory: Normal respiratory effort, no distress  Abdomen: Soft, gravid, nontender  Pelvic: Cervical exam deferred         Extremities: Edema: None  Fetal Status: Fetal Heart Rate (bpm): 140 u/s Fundal Height: 30 cm Movement: Present    Results for orders placed or  performed in visit on 12/12/18 (from the past 24 hour(s))  POC Urinalysis Dipstick OB   Collection Time: 12/12/18 10:07 AM  Result Value Ref Range   Color, UA     Clarity, UA     Glucose, UA Negative Negative   Bilirubin, UA     Ketones, UA neg    Spec Grav, UA     Blood, UA neg    pH, UA     POC,PROTEIN,UA Negative Negative, Trace, Small (1+), Moderate (2+), Large (3+), 4+   Urobilinogen, UA     Nitrite, UA neg    Leukocytes, UA Negative Negative   Appearance     Odor      Assessment & Plan:  1) Low-risk pregnancy G1P0000 at [redacted]w[redacted]d with an Estimated Date of Delivery: 03/05/19  2) Cyst on choroid plexus, resolved   3) recurrent UTI on Keflex HS   Meds: No orders of the defined types were placed in this encounter.  Labs/procedures today: PN2, [place] 61+9 wks,cephalic,cx 3 cm,posterior placenta gr 0,normal ovaries bilat,afi 13 cm,resolved CPC,fhr 140 bpm,efw 1257 g 55%  Plan:   1.Continue routine obstetrical care  2.F/u 2 weeks televisit 3. Rx Keflex already. Was considered for Macrodantin HS supression but has had back discomfort with UTI's and Keflex gives better tissue penetration.  Reviewed: Preterm labor symptoms and general obstetric precautions including but not limited to vaginal bleeding, contractions, leaking of fluid and fetal movement were reviewed in detail with the patient.  All questions were answered  Follow-up: Return in about 2 weeks (around 12/26/2018) for Televisit.  Orders Placed  This Encounter  Procedures  . POC Urinalysis Dipstick OB   By signing my name below, I, Arnette NorrisMari Johnson, attest that this documentation has been prepared under the direction and in the presence of Tilda BurrowFerguson, Albirtha Grinage V, MD. Electronically Signed: Arnette NorrisMari Johnson Medical Scribe. 12/12/18. 10:41 AM.  I personally performed the services described in this documentation, which was SCRIBED in my presence. The recorded information has been reviewed and considered accurate. It has been edited as  necessary during review. Tilda BurrowJohn V Tane Biegler, MD

## 2018-12-12 NOTE — Progress Notes (Signed)
[place] 60+6 wks,cephalic,cx 3 cm,posterior placenta gr 0,normal ovaries bilat,afi 13 cm,resolved CPC,fhr 140 bpm,efw 1257 g 55%

## 2018-12-13 LAB — CBC
Hematocrit: 31.7 % — ABNORMAL LOW (ref 34.0–46.6)
Hemoglobin: 11 g/dL — ABNORMAL LOW (ref 11.1–15.9)
MCH: 29.3 pg (ref 26.6–33.0)
MCHC: 34.7 g/dL (ref 31.5–35.7)
MCV: 85 fL (ref 79–97)
Platelets: 206 10*3/uL (ref 150–450)
RBC: 3.75 x10E6/uL — ABNORMAL LOW (ref 3.77–5.28)
RDW: 12.9 % (ref 11.7–15.4)
WBC: 8.1 10*3/uL (ref 3.4–10.8)

## 2018-12-13 LAB — RPR: RPR Ser Ql: NONREACTIVE

## 2018-12-13 LAB — GLUCOSE TOLERANCE, 2 HOURS W/ 1HR
Glucose, 1 hour: 111 mg/dL (ref 65–179)
Glucose, 2 hour: 92 mg/dL (ref 65–152)
Glucose, Fasting: 73 mg/dL (ref 65–91)

## 2018-12-13 LAB — ANTIBODY SCREEN: Antibody Screen: NEGATIVE

## 2018-12-13 LAB — HIV ANTIBODY (ROUTINE TESTING W REFLEX): HIV Screen 4th Generation wRfx: NONREACTIVE

## 2018-12-26 ENCOUNTER — Ambulatory Visit (INDEPENDENT_AMBULATORY_CARE_PROVIDER_SITE_OTHER): Payer: BC Managed Care – PPO | Admitting: Obstetrics & Gynecology

## 2018-12-26 ENCOUNTER — Encounter: Payer: Self-pay | Admitting: Obstetrics & Gynecology

## 2018-12-26 ENCOUNTER — Other Ambulatory Visit: Payer: Self-pay

## 2018-12-26 VITALS — BP 111/62 | HR 74 | Wt 166.0 lb

## 2018-12-26 DIAGNOSIS — Z3A30 30 weeks gestation of pregnancy: Secondary | ICD-10-CM

## 2018-12-26 DIAGNOSIS — O2343 Unspecified infection of urinary tract in pregnancy, third trimester: Secondary | ICD-10-CM

## 2018-12-26 DIAGNOSIS — Z3403 Encounter for supervision of normal first pregnancy, third trimester: Secondary | ICD-10-CM

## 2018-12-26 NOTE — Progress Notes (Signed)
   TELEHEALTH VIRTUAL OBSTETRICS VISIT ENCOUNTER NOTE  I connected with Zoe Armstrong on 12/26/18 at 10:30 AM EDT by telephone at home and verified that I am speaking with the correct person using two identifiers.   I discussed the limitations, risks, security and privacy concerns of performing an evaluation and management service by telephone and the availability of in person appointments. I also discussed with the patient that there may be a patient responsible charge related to this service. The patient expressed understanding and agreed to proceed.  Subjective:  Zoe Armstrong is a 28 y.o. G1P0000 at [redacted]w[redacted]d being followed for ongoing prenatal care.  She is currently monitored for the following issues for this low-risk pregnancy and has Anxiety; Recurrent UTI; and Supervision of normal first pregnancy on their problem list.  Patient reports no complaints. Reports fetal movement. Denies any contractions, bleeding or leaking of fluid.   The following portions of the patient's history were reviewed and updated as appropriate: allergies, current medications, past family history, past medical history, past social history, past surgical history and problem list.   Objective:   General:  Alert, oriented and cooperative.   Mental Status: Normal mood and affect perceived. Normal judgment and thought content.  Rest of physical exam deferred due to type of encounter  Assessment and Plan:  Pregnancy: G1P0000 at [redacted]w[redacted]d There are no diagnoses linked to this encounter. Preterm labor symptoms and general obstetric precautions including but not limited to vaginal bleeding, contractions, leaking of fluid and fetal movement were reviewed in detail with the patient.  I discussed the assessment and treatment plan with the patient. The patient was provided an opportunity to ask questions and all were answered. The patient agreed with the plan and demonstrated an understanding of the instructions. The patient was  advised to call back or seek an in-person office evaluation/go to [facility] at [facility] for any urgent or concerning symptoms. Please refer to After Visit Summary for other counseling recommendations.   I provided 11 minutes of non-face-to-face time during this encounter.  Return in about 3 weeks (around 01/16/2019) for [place] in office.  No future appointments.  Florian Buff, MD [facility] [employer], [place]

## 2019-01-23 ENCOUNTER — Ambulatory Visit (INDEPENDENT_AMBULATORY_CARE_PROVIDER_SITE_OTHER): Payer: BC Managed Care – PPO | Admitting: Advanced Practice Midwife

## 2019-01-23 ENCOUNTER — Other Ambulatory Visit: Payer: Self-pay

## 2019-01-23 VITALS — BP 104/64 | HR 95 | Wt 175.0 lb

## 2019-01-23 DIAGNOSIS — Z3A34 34 weeks gestation of pregnancy: Secondary | ICD-10-CM

## 2019-01-23 DIAGNOSIS — R3121 Asymptomatic microscopic hematuria: Secondary | ICD-10-CM

## 2019-01-23 DIAGNOSIS — Z331 Pregnant state, incidental: Secondary | ICD-10-CM

## 2019-01-23 DIAGNOSIS — Z23 Encounter for immunization: Secondary | ICD-10-CM

## 2019-01-23 DIAGNOSIS — Z3403 Encounter for supervision of normal first pregnancy, third trimester: Secondary | ICD-10-CM

## 2019-01-23 DIAGNOSIS — Z1389 Encounter for screening for other disorder: Secondary | ICD-10-CM

## 2019-01-23 LAB — POCT URINALYSIS DIPSTICK OB
Glucose, UA: NEGATIVE
Ketones, UA: NEGATIVE
Leukocytes, UA: NEGATIVE
Nitrite, UA: NEGATIVE
POC,PROTEIN,UA: NEGATIVE

## 2019-01-23 NOTE — Addendum Note (Signed)
Addended by: Christiana Pellant A on: 01/23/2019 11:48 AM   Modules accepted: Orders

## 2019-01-23 NOTE — Progress Notes (Addendum)
  G1P0000 [redacted]w[redacted]d Estimated Date of Delivery: 03/05/19  Blood pressure 104/64, pulse 95, weight 175 lb (79.4 kg), last menstrual period 05/29/2018.   BP weight and urine results all reviewed and noted. Denies UTI sx. On nightly keflex  Please refer to the obstetrical flow sheet for the fundal height and fetal heart rate documentation:  Patient reports good fetal movement, denies any bleeding and no rupture of membranes symptoms or regular contractions. Patient is without complaints. All questions were answered.   Physical Assessment:   Vitals:   01/23/19 1106  BP: 104/64  Pulse: 95  Weight: 175 lb (79.4 kg)  Body mass index is 31 kg/m.        Physical Examination:   General appearance: Well appearing, and in no distress  Mental status: Alert, oriented to person, place, and time  Skin: Warm & dry  Cardiovascular: Normal heart rate noted  Respiratory: Normal respiratory effort, no distress  Abdomen: Soft, gravid, nontender  Pelvic: Cervical exam deferred         Extremities: Edema: None  Fetal Status:     Movement: Present    Results for orders placed or performed in visit on 01/23/19 (from the past 24 hour(s))  POC Urinalysis Dipstick OB   Collection Time: 01/23/19 11:10 AM  Result Value Ref Range   Color, UA     Clarity, UA     Glucose, UA Negative Negative   Bilirubin, UA     Ketones, UA neg    Spec Grav, UA     Blood, UA large    pH, UA     POC,PROTEIN,UA Negative Negative, Trace, Small (1+), Moderate (2+), Large (3+), 4+   Urobilinogen, UA     Nitrite, UA neg    Leukocytes, UA Negative Negative   Appearance     Odor       Orders Placed This Encounter  Procedures  . Urine Culture  . POC Urinalysis Dipstick OB    Plan:  Continued routine obstetrical care, Tdap today. rx for breast pump written  Return in about 2 weeks (around 02/06/2019) for [place].

## 2019-01-23 NOTE — Patient Instructions (Signed)

## 2019-01-25 ENCOUNTER — Other Ambulatory Visit: Payer: Self-pay | Admitting: Advanced Practice Midwife

## 2019-01-25 LAB — URINE CULTURE

## 2019-01-25 LAB — SPECIMEN STATUS REPORT

## 2019-01-25 MED ORDER — CEPHALEXIN 500 MG PO CAPS: 500.0000 mg | ORAL_CAPSULE | Freq: Every day | ORAL | 2 refills | Status: DC

## 2019-02-07 ENCOUNTER — Other Ambulatory Visit: Payer: Self-pay

## 2019-02-07 ENCOUNTER — Encounter: Payer: Self-pay | Admitting: Women's Health

## 2019-02-07 ENCOUNTER — Ambulatory Visit (INDEPENDENT_AMBULATORY_CARE_PROVIDER_SITE_OTHER): Payer: BC Managed Care – PPO | Admitting: Women's Health

## 2019-02-07 VITALS — BP 97/61 | HR 91 | Wt 180.0 lb

## 2019-02-07 DIAGNOSIS — Z1389 Encounter for screening for other disorder: Secondary | ICD-10-CM

## 2019-02-07 DIAGNOSIS — O2343 Unspecified infection of urinary tract in pregnancy, third trimester: Secondary | ICD-10-CM

## 2019-02-07 DIAGNOSIS — Z3403 Encounter for supervision of normal first pregnancy, third trimester: Secondary | ICD-10-CM

## 2019-02-07 DIAGNOSIS — Z331 Pregnant state, incidental: Secondary | ICD-10-CM

## 2019-02-07 DIAGNOSIS — Z3A36 36 weeks gestation of pregnancy: Secondary | ICD-10-CM

## 2019-02-07 DIAGNOSIS — Z3483 Encounter for supervision of other normal pregnancy, third trimester: Secondary | ICD-10-CM

## 2019-02-07 LAB — POCT URINALYSIS DIPSTICK OB
Blood, UA: NEGATIVE
Glucose, UA: NEGATIVE
Ketones, UA: NEGATIVE
Leukocytes, UA: NEGATIVE
Nitrite, UA: NEGATIVE
POC,PROTEIN,UA: NEGATIVE

## 2019-02-07 NOTE — Patient Instructions (Signed)
Zoe Armstrong, I greatly value your feedback.  If you receive a survey following your visit with [place] today, we appreciate you taking the time to fill it out.  Thanks, Zoe Armstrong, CNM, [facility]  Zoe Armstrong!!! It is now Zoe Armstrong at [facility] ([place], [place] [address]) Entrance located off of [address] parking   Go to [email or web address] to register for FREE online childbirth classes    Call the office [phone]) or go to [facility] if:  You begin to have strong, frequent Armstrong  Your water breaks.  Sometimes it is a big gush of fluid, sometimes it is just a trickle that keeps getting your panties wet or running down your legs  You have vaginal bleeding.  It is normal to have a small amount of spotting if your cervix was checked.   You don't feel your baby moving like normal.  If you don't, get you something to eat and drink and lay down and focus on feeling your baby move.  You should feel at least 10 movements in 2 hours.  If you don't, you should call the office or go to Zoe Armstrong   Your provider has recommended that you check your blood pressure (BP) at least once a week at home. If you do not have a blood pressure cuff at home, one will be provided for you. Contact your provider if you have not received your monitor within 1 week.   Helpful Tips for Accurate Home Blood Pressure Checks  . Don't smoke, exercise, or drink caffeine 30 minutes before checking your BP . Use the restroom before checking your BP (a full bladder can raise your pressure) . Relax in a comfortable upright chair . Feet on the ground . Left arm resting comfortably on a flat surface at the level of your heart . Legs uncrossed . Back supported . Sit quietly and don't talk . Place the cuff on your bare arm . Adjust snuggly, so that only two fingertips can fit between your skin and  the top of the cuff . Check 2 readings separated by at least one minute . Keep a log of your BP readings . For a visual, please reference this diagram: http://ccnc.care/bpdiagram  Provider Name: Zoe Armstrong     Phone: [phone]  Zone 1: ALL CLEAR  Continue to monitor your symptoms:  . BP reading is less than 140 (top number) or less than 90 (bottom number)  . No right upper stomach pain . No headaches or seeing spots . No feeling nauseated or throwing up . No swelling in face and hands  Zone 2: CAUTION Call your doctor's office for any of the following:  . BP reading is greater than 140 (top number) or greater than 90 (bottom number)  . Stomach pain under your ribs in the middle or right side . Headaches or seeing spots . Feeling nauseated or throwing up . Swelling in face and hands  Zone 3: EMERGENCY  Seek immediate medical care if you have any of the following:  . BP reading is greater than160 (top number) or greater than 110 (bottom number) . Severe headaches not improving with Tylenol . Serious difficulty catching your breath . Any worsening symptoms from Zone 2   [name] Pediatricians/Zoe Doctors:  [facility] [place] [phone]  [facility] [phone] (usually not accepting new Armstrong unless you have Zoe there already, you are always welcome to call and ask)       [facility] [phone]       [facility] [place] Pediatricians/Zoe Doctors:   [facility]: [phone]  [facility]: [phone]  Zoe Armstrong: [phone]  [facility]:   [facility]: [phone]   [facility]: [phone]  [facility] Z[facility] Doctors:  [facility]: [phone]     Zoe PeltonBraxton Hicks Armstrong Armstrong of the uterus can occur throughout pregnancy,  but they are not always a sign that you are in labor. You may have practice Armstrong called Zoe Armstrong. These false labor Armstrong are sometimes confused with true labor. What are Zoe PeltonBraxton Hicks Armstrong? Zoe Armstrong are tightening movements that occur in the muscles of the uterus before labor. Unlike true labor Armstrong, these contractions do not result in opening (dilation) and thinning of the cervix. Toward the end of pregnancy (32-34 weeks), Zoe Armstrong can happen more often and may become stronger. These Armstrong are sometimes difficult to tell apart from true labor because they can be very uncomfortable. You should not feel embarrassed if you go to the hospital with false labor. Sometimes, the only way to tell if you are in true labor is for your health care provider to look for changes in the cervix. The health care provider will do a physical exam and may monitor your Armstrong. If you are not in true labor, the exam should show that your cervix is not dilating and your water has not broken. If there are no other health problems associated with your pregnancy, it is completely safe for you to be sent home with false labor. You may continue to have Zoe Armstrong until you go into true labor. How to tell the difference between true labor and false labor True labor  Armstrong last 30-70 seconds.  Armstrong become very regular.  Discomfort is usually felt in the top of the uterus, and it spreads to the lower abdomen and low back.  Contractions do not go away with walking.  Armstrong usually become more intense and increase in frequency.  The cervix dilates and gets thinner. False labor  Armstrong are usually shorter and not as strong as true labor Armstrong.  Armstrong are usually irregular.  Armstrong are often felt in the front of the lower abdomen and in the groin.  Armstrong may  go away when you walk around or change positions while lying down.  Armstrong get weaker and are shorter-lasting as time goes on.  The cervix usually does not dilate or become thin. Follow these instructions at home:   Take over-the-counter and prescription medicines only as told by your health care provider.  Keep up with your usual exercises and follow other instructions from your health care provider.  Eat and drink lightly if you think you are going into labor.  If Zoe Armstrong are making you uncomfortable: ? Change your position from lying down or resting to walking, or change from walking to resting. ? Sit and rest in a tub of warm water. ? Drink enough fluid to keep your urine pale yellow. Dehydration may cause these Armstrong. ? Do slow and deep breathing several times an hour.  Keep all follow-up prenatal visits as told by your health care provider. This is important. Contact a health care provider if:  You have a fever.  You have continuous pain in your abdomen. Get help right away if:  Your Armstrong become stronger, more regular, and closer together.  You have fluid leaking or gushing from your vagina.  You pass blood-tinged mucus (bloody show).  You have bleeding from your vagina.  You have low back pain that you never had before.  You feel your baby's head pushing down and causing pelvic pressure.  Your baby is not moving inside you as much as it used to. Summary  Armstrong that occur before labor are called Zoe Armstrong, false labor, or practice Armstrong.  Zoe Armstrong are usually shorter, weaker, farther apart, and less regular than true labor Armstrong. True labor Armstrong usually become progressively stronger and regular, and they become more frequent.  Manage discomfort from [facility] by changing position, resting in a warm bath, drinking plenty of water, or practicing  deep breathing. This information is not intended to replace advice given to you by your health care provider. Make sure you discuss any questions you have with your health care provider. Document Released: 10/29/2016 Document Revised: 05/28/2017 Document Reviewed: 10/29/2016 Elsevier Patient Education  2020 [employer]

## 2019-02-07 NOTE — Progress Notes (Signed)
   LOW-RISK PREGNANCY VISIT Patient name: Zoe Armstrong MRN [id number]  Date of birth: 08-23-1990 Chief Complaint:   Routine Prenatal Visit (gbs.gc/ gc)  History of Present Illness:   Zoe Armstrong is a 28 y.o. G75P0000 female at [redacted]w[redacted]d with an Estimated Date of Delivery: 03/05/19 being seen today for ongoing management of a low-risk pregnancy.  Today she reports had leaking of fluid over weekend, none since, called triage line was told to monitor it.  Denies abnormal discharge, itching/odor/irritation. Finishing keflex tx today.  Contractions: Not present.  .  Movement: Present. reports leaking of fluid. Review of Systems:   Pertinent items are noted in HPI Denies abnormal vaginal discharge w/ itching/odor/irritation, headaches, visual changes, shortness of breath, chest pain, abdominal pain, severe nausea/vomiting, or problems with urination or bowel movements unless otherwise stated above. Pertinent History Reviewed:  Reviewed past medical,surgical, social, obstetrical and family history.  Reviewed problem list, medications and allergies. Physical Assessment:   Vitals:   02/07/19 0934  BP: 97/61  Pulse: 91  Weight: 180 lb (81.6 kg)  Body mass index is 31.89 kg/m.        Physical Examination:   General appearance: Well appearing, and in no distress  Mental status: Alert, oriented to person, place, and time  Skin: Warm & dry  Cardiovascular: Normal heart rate noted  Respiratory: Normal respiratory effort, no distress  Abdomen: Soft, gravid, nontender  Pelvic: SSE: cx visually closed, white thin d/c, no odor , fern & nitrazine neg Dilation: Closed Effacement (%): Thick Station: -3  Extremities: Edema: Mild pitting, slight indentation  Fetal Status: Fetal Heart Rate (bpm): 130 Fundal Height: 36 cm Movement: Present Presentation: Vertex  Results for orders placed or performed in visit on 02/07/19 (from the past 24 hour(s))  POC Urinalysis Dipstick OB   Collection Time: 02/07/19  9:46 AM   Result Value Ref Range   Color, UA     Clarity, UA     Glucose, UA Negative Negative   Bilirubin, UA     Ketones, UA neg    Spec Grav, UA     Blood, UA neg    pH, UA     POC,PROTEIN,UA Negative Negative, Trace, Small (1+), Moderate (2+), Large (3+), 4+   Urobilinogen, UA     Nitrite, UA neg    Leukocytes, UA Negative Negative   Appearance     Odor      Assessment & Plan:  1) Low-risk pregnancy G1P0000 at [redacted]w[redacted]d with an Estimated Date of Delivery: 03/05/19   2) Recurrent UTI, send urine cx today, if still + tx w/ Rocephin, continue nightly suppression   Meds: No orders of the defined types were placed in this encounter.  Labs/procedures today: gbs, gc/ct, fern  Plan:  Continue routine obstetrical care   Reviewed: Preterm labor symptoms and general obstetric precautions including but not limited to vaginal bleeding, contractions, leaking of fluid and fetal movement were reviewed in detail with the patient.  All questions were answered.  Wants in person visits  Follow-up: Return in about 1 week (around 02/14/2019) for LROB, in person.  Orders Placed This Encounter  Procedures  . Strep Gp B NAA  . GC/Chlamydia Probe Amp  . Urine Culture  . POC Urinalysis Dipstick OB   Roma Schanz CNM, [facility] 02/07/2019 10:17 AM

## 2019-02-09 LAB — STREP GP B NAA: Strep Gp B NAA: NEGATIVE

## 2019-02-09 LAB — URINE CULTURE: Organism ID, Bacteria: NO GROWTH

## 2019-02-11 LAB — GC/CHLAMYDIA PROBE AMP
Chlamydia trachomatis, NAA: NEGATIVE
Neisseria Gonorrhoeae by PCR: NEGATIVE

## 2019-02-15 ENCOUNTER — Other Ambulatory Visit: Payer: Self-pay

## 2019-02-15 ENCOUNTER — Ambulatory Visit (INDEPENDENT_AMBULATORY_CARE_PROVIDER_SITE_OTHER): Payer: BC Managed Care – PPO | Admitting: Obstetrics and Gynecology

## 2019-02-15 VITALS — BP 102/61 | HR 85 | Wt 183.4 lb

## 2019-02-15 DIAGNOSIS — Z3403 Encounter for supervision of normal first pregnancy, third trimester: Secondary | ICD-10-CM

## 2019-02-15 DIAGNOSIS — Z3A37 37 weeks gestation of pregnancy: Secondary | ICD-10-CM

## 2019-02-15 DIAGNOSIS — Z1389 Encounter for screening for other disorder: Secondary | ICD-10-CM

## 2019-02-15 DIAGNOSIS — Z331 Pregnant state, incidental: Secondary | ICD-10-CM

## 2019-02-15 LAB — POCT URINALYSIS DIPSTICK OB
Blood, UA: NEGATIVE
Glucose, UA: NEGATIVE
Ketones, UA: NEGATIVE
Leukocytes, UA: NEGATIVE
Nitrite, UA: NEGATIVE
POC,PROTEIN,UA: NEGATIVE

## 2019-02-15 NOTE — Progress Notes (Signed)
`  Patient ID: Zoe Armstrong, female   DOB: August 26, 1990, 28 y.o.   MRN: [id number]    LOW-RISK PREGNANCY VISIT Patient name: Zoe Armstrong MRN [id number]  Date of birth: 1991/06/12 Chief Complaint:   Routine Prenatal Visit  History of Present Illness:   Zoe Armstrong is a 28 y.o. G72P0000 female at [redacted]w[redacted]d with an Estimated Date of Delivery: 03/05/19 being seen today for ongoing management of a low-risk pregnancy. 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 Prefer in person visits Today she reports no complaints. Contractions: Not present. Vag. Bleeding: None.  Movement: Present. denies leaking of fluid. Review of Systems:   Pertinent items are noted in HPI Denies abnormal vaginal discharge w/ itching/odor/irritation, headaches, visual changes, shortness of breath, chest pain, abdominal pain, severe nausea/vomiting, or problems with urination or bowel movements unless otherwise stated above. Pertinent History Reviewed:  Reviewed past medical,surgical, social, obstetrical and family history.  Reviewed problem list, medications and allergies. Physical Assessment:   Vitals:   02/15/19 1052  BP: 102/61  Pulse: 85  Weight: 183 lb 6.4 oz (83.2 kg)  Body mass index is 32.49 kg/m.        Physical Examination:   General appearance: Well appearing, and in no distress  Mental status: Alert, oriented to person, place, and time  Skin: Warm & dry  Cardiovascular: Normal heart rate noted  Respiratory: Normal respiratory effort, no distress  Abdomen: Soft, gravid, nontender  Pelvic: Cervical exam performed posterior, long and closed        Extremities: Edema: Moderate pitting, indentation subsides rapidly  Fetal Status: Fetal Heart Rate (bpm): 156 Fundal Height: 38 cm Movement: Present    Results for orders placed or performed in visit on 02/15/19 (from the past 24 hour(s))  POC Urinalysis Dipstick OB   Collection Time: 02/15/19 10:51 AM   Result Value Ref Range   Color, UA     Clarity, UA     Glucose, UA Negative Negative   Bilirubin, UA     Ketones, UA neg    Spec Grav, UA     Blood, UA neg    pH, UA     POC,PROTEIN,UA Negative Negative, Trace, Small (1+), Moderate (2+), Large (3+), 4+   Urobilinogen, UA     Nitrite, UA neg    Leukocytes, UA Negative Negative   Appearance     Odor      Assessment & Plan:  1) Low-risk pregnancy G1P0000 at [redacted]w[redacted]d with an Estimated Date of Delivery: 03/05/19    Meds: No orders of the defined types were placed in this encounter.  Labs/procedures today: None  Plan:  Continue routine obstetrical care   Reviewed: Term labor symptoms and general obstetric precautions including but not limited to vaginal bleeding, contractions, leaking of fluid and fetal movement were reviewed in detail with the patient.  Follow-up: Return in about 1 week (around 02/22/2019) for Televisit.  Orders Placed This Encounter  Procedures  . POC Urinalysis Dipstick OB   By signing my name below, I, Samul Dada, attest that this documentation has been prepared under the direction and in the presence of Jonnie Kind, MD. Electronically Signed: [place]. 02/15/19. 11:45 AM.  I personally performed the services described in this documentation, which was SCRIBED in my presence. The recorded information has been reviewed and considered accurate. It has been edited as necessary during review. Jonnie Kind, MD

## 2019-02-22 ENCOUNTER — Encounter: Payer: Self-pay | Admitting: *Deleted

## 2019-02-22 ENCOUNTER — Ambulatory Visit (INDEPENDENT_AMBULATORY_CARE_PROVIDER_SITE_OTHER): Payer: BC Managed Care – PPO | Admitting: Student

## 2019-02-22 ENCOUNTER — Telehealth: Payer: Self-pay | Admitting: Student

## 2019-02-22 ENCOUNTER — Other Ambulatory Visit: Payer: Self-pay

## 2019-02-22 VITALS — BP 133/79 | HR 106 | Wt 186.0 lb

## 2019-02-22 DIAGNOSIS — Z1389 Encounter for screening for other disorder: Secondary | ICD-10-CM

## 2019-02-22 DIAGNOSIS — Z3403 Encounter for supervision of normal first pregnancy, third trimester: Secondary | ICD-10-CM

## 2019-02-22 DIAGNOSIS — Z331 Pregnant state, incidental: Secondary | ICD-10-CM

## 2019-02-22 DIAGNOSIS — Z3A38 38 weeks gestation of pregnancy: Secondary | ICD-10-CM

## 2019-02-22 LAB — POCT URINALYSIS DIPSTICK OB
Blood, UA: NEGATIVE
Glucose, UA: NEGATIVE
Ketones, UA: NEGATIVE
Leukocytes, UA: NEGATIVE
Nitrite, UA: NEGATIVE
POC,PROTEIN,UA: NEGATIVE

## 2019-02-22 NOTE — Progress Notes (Signed)
     PRENATAL VISIT NOTE  Subjective:  Zoe Armstrong is a 28 y.o. G1P0000 at [redacted]w[redacted]d being seen today for ongoing prenatal care.  She is currently monitored for the following issues for this low-risk pregnancy and has Anxiety; Recurrent UTI; and Supervision of normal first pregnancy on their problem list.  Patient reports no complaints. .  Contractions: Not present. Vag. Bleeding: None.  Movement: Present. Denies leaking of fluid.   The following portions of the patient's history were reviewed and updated as appropriate: allergies, current medications, past family history, past medical history, past social history, past surgical history and problem list.   Objective:   Vitals:   02/22/19 1527  BP: 133/79  Pulse: (!) 106  Weight: 186 lb (84.4 kg)    Fetal Status: Fetal Heart Rate (bpm): 155 Fundal Height: 37 cm Movement: Present  Presentation: Vertex  General:  Alert, oriented and cooperative. Patient is in no acute distress.  Skin: Skin is warm and dry. No rash noted.   Cardiovascular: Normal heart rate noted  Respiratory: Normal respiratory effort, no problems with respiration noted  Abdomen: Soft, gravid, appropriate for gestational age.  Pain/Pressure: Absent     Pelvic: Cervical exam performed Dilation: Closed Effacement (%): Thick    Extremities: Normal range of motion.  Edema: Moderate pitting, indentation subsides rapidly  Mental Status: Normal mood and affect. Normal behavior. Normal judgment and thought content.   Assessment and Plan:  Pregnancy: G1P0000 at [redacted]w[redacted]d  3. Encounter for supervision of normal first pregnancy in third trimester -Vertex by [place], ballotable.  -Keep taking Macrobid at night -Discussed that we will let her stay pregnant until 41 weeks, we would not induce before that time.   Term labor symptoms and general obstetric precautions including but not limited to vaginal bleeding, contractions, leaking of fluid and fetal movement were reviewed in detail with  the patient. Please refer to After Visit Summary for other counseling recommendations.   Return in about 1 week (around 03/01/2019), or LROB.  Future Appointments  Date Time Provider [place]  03/02/2019  3:50 PM Christin Fudge, CNM [facility]    Mervyn Skeeters [place], [place]

## 2019-02-22 NOTE — Telephone Encounter (Signed)
Patient called, requesting a note with her expected due date and the reason she'll be out of work.  She was seen today.  [phone]  Fax [phone]

## 2019-03-01 ENCOUNTER — Ambulatory Visit (INDEPENDENT_AMBULATORY_CARE_PROVIDER_SITE_OTHER): Payer: BC Managed Care – PPO | Admitting: Women's Health

## 2019-03-01 ENCOUNTER — Other Ambulatory Visit: Payer: Self-pay

## 2019-03-01 ENCOUNTER — Encounter: Payer: Self-pay | Admitting: Women's Health

## 2019-03-01 VITALS — BP 114/70 | HR 84 | Wt 187.5 lb

## 2019-03-01 DIAGNOSIS — L299 Pruritus, unspecified: Secondary | ICD-10-CM

## 2019-03-01 DIAGNOSIS — Z331 Pregnant state, incidental: Secondary | ICD-10-CM

## 2019-03-01 DIAGNOSIS — Z3A39 39 weeks gestation of pregnancy: Secondary | ICD-10-CM

## 2019-03-01 DIAGNOSIS — Z3403 Encounter for supervision of normal first pregnancy, third trimester: Secondary | ICD-10-CM

## 2019-03-01 DIAGNOSIS — Z1389 Encounter for screening for other disorder: Secondary | ICD-10-CM

## 2019-03-01 LAB — POCT URINALYSIS DIPSTICK OB
Blood, UA: NEGATIVE
Glucose, UA: NEGATIVE
Ketones, UA: NEGATIVE
Leukocytes, UA: NEGATIVE
Nitrite, UA: NEGATIVE
POC,PROTEIN,UA: NEGATIVE

## 2019-03-01 NOTE — Progress Notes (Signed)
   Work-in LOW-RISK PREGNANCY VISIT Patient name: Zoe Armstrong MRN [id number]  Date of birth: 1991/04/19 Chief Complaint:   work in ob (? leaking fluid; itching on belly and feet)  History of Present Illness:   Zoe Armstrong is a 28 y.o. G1P0000 female at [redacted]w[redacted]d with an Estimated Date of Delivery: 03/05/19 being seen today for ongoing management of a low-risk pregnancy.  Today she reports was bending down this am and felt underwear were moist. Also itching on lower belly and tops of feet, worse at night, not fasting today. Contractions: Not present. Vag. Bleeding: None.  Movement: Present. reports leaking of fluid. Review of Systems:   Pertinent items are noted in HPI Denies abnormal vaginal discharge w/ itching/odor/irritation, headaches, visual changes, shortness of breath, chest pain, abdominal pain, severe nausea/vomiting, or problems with urination or bowel movements unless otherwise stated above. Pertinent History Reviewed:  Reviewed past medical,surgical, social, obstetrical and family history.  Reviewed problem list, medications and allergies. Physical Assessment:   Vitals:   03/01/19 1120  BP: 114/70  Pulse: 84  Weight: 187 lb 8 oz (85 kg)  Body mass index is 33.21 kg/m.        Physical Examination:   General appearance: Well appearing, and in no distress  Mental status: Alert, oriented to person, place, and time  Skin: Warm & dry  Cardiovascular: Normal heart rate noted  Respiratory: Normal respiratory effort, no distress  Abdomen: Soft, gravid, nontender  Pelvic: SSE by Cherilyn Marrs, SNP cx visually closed, small amt thin milky fluid no change w/ valsalva, fern & nitrazine neg  Dilation: Closed Effacement (%): Thick Station: -3  Extremities: Edema: Moderate pitting, indentation subsides rapidly  Fetal Status: Fetal Heart Rate (bpm): 152 Fundal Height: 39 cm Movement: Present Presentation: Vertex  Results for orders placed or performed in visit on 03/01/19 (from the past 24  hour(s))  POC Urinalysis Dipstick OB   Collection Time: 03/01/19 11:22 AM  Result Value Ref Range   Color, UA     Clarity, UA     Glucose, UA Negative Negative   Bilirubin, UA     Ketones, UA neg    Spec Grav, UA     Blood, UA neg    pH, UA     POC,PROTEIN,UA Negative Negative, Trace, Small (1+), Moderate (2+), Large (3+), 4+   Urobilinogen, UA     Nitrite, UA neg    Leukocytes, UA Negative Negative   Appearance     Odor      Assessment & Plan:  1) Low-risk pregnancy G1P0000 at [redacted]w[redacted]d with an Estimated Date of Delivery: 03/05/19   2) Normal vaginal d/c> fern/nitrazine neg  3) Itching lower belly, tops of feet, worse at night> npo after mn tonight, fasting bile acid and cmp in am   Meds: No orders of the defined types were placed in this encounter.  Labs/procedures today: SSE, SVE  Plan:  Continue routine obstetrical care   Reviewed: Term labor symptoms and general obstetric precautions including but not limited to vaginal bleeding, contractions, leaking of fluid and fetal movement were reviewed in detail with the patient.  All questions were answered.   Follow-up: Return for cancel tomorrows appt, come in am for labs only, then f/u next Monday for lrob w/ NST.  Orders Placed This Encounter  Procedures  . Comprehensive metabolic panel  . Bile acids, total  . POC Urinalysis Dipstick OB   Roma Schanz CNM, [facility] 03/01/2019 11:48 AM

## 2019-03-01 NOTE — Patient Instructions (Signed)
Zoe Armstrong, I greatly value your feedback.  If you receive a survey following your visit with [place] today, we appreciate you taking the time to fill it out.  Thanks, [name], CNM, [facility]  [place]!!! It is now [place] at [facility] ([place], [place] [address]) Entrance located off of [address] parking   Go to [email or web address] to register for FREE online childbirth classes    Call the office [phone]) or go to [facility] if:  You begin to have strong, frequent contractions  Your water breaks.  Sometimes it is a big gush of fluid, sometimes it is just a trickle that keeps getting your panties wet or running down your legs  You have vaginal bleeding.  It is normal to have a small amount of spotting if your cervix was checked.   You don't feel your baby moving like normal.  If you don't, get you something to eat and drink and lay down and focus on feeling your baby move.  You should feel at least 10 movements in 2 hours.  If you don't, you should call the office or go to [place] Blood Pressure Monitoring for Patients   Your provider has recommended that you check your blood pressure (BP) at least once a week at home. If you do not have a blood pressure cuff at home, one will be provided for you. Contact your provider if you have not received your monitor within 1 week.   Helpful Tips for Accurate Home Blood Pressure Checks   Don't smoke, exercise, or drink caffeine 30 minutes before checking your BP  Use the restroom before checking your BP (a full bladder can raise your pressure)  Relax in a comfortable upright chair  Feet on the ground  Left arm resting comfortably on a flat surface at the level of your heart  Legs uncrossed  Back supported  Sit quietly and don't talk  Place the cuff on your bare arm  Adjust snuggly, so that only two fingertips can fit between your skin and  the top of the cuff  Check 2 readings separated by at least one minute  Keep a log of your BP readings  For a visual, please reference this diagram: http://ccnc.care/bpdiagram  Provider Name: [facility]     Phone: [phone]  Zone 1: ALL CLEAR  Continue to monitor your symptoms:   BP reading is less than 140 (top number) or less than 90 (bottom number)   No right upper stomach pain  No headaches or seeing spots  No feeling nauseated or throwing up  No swelling in face and hands  Zone 2: CAUTION Call your doctor's office for any of the following:   BP reading is greater than 140 (top number) or greater than 90 (bottom number)   Stomach pain under your ribs in the middle or right side  Headaches or seeing spots  Feeling nauseated or throwing up  Swelling in face and hands  Zone 3: EMERGENCY  Seek immediate medical care if you have any of the following:   BP reading is greater than160 (top number) or greater than 110 (bottom number)  Severe headaches not improving with Tylenol  Serious difficulty catching your breath  Any worsening symptoms from Zone 2    Braxton Hicks Contractions Contractions of the uterus can occur throughout pregnancy, but they are not always a sign that you are in  labor. You may have practice contractions called Braxton Hicks contractions. These false labor contractions are sometimes confused with true labor. What are [place] contractions? Braxton Hicks contractions are tightening movements that occur in the muscles of the uterus before labor. Unlike true labor contractions, these contractions do not result in opening (dilation) and thinning of the cervix. Toward the end of pregnancy (32-34 weeks), Braxton Hicks contractions can happen more often and may become stronger. These contractions are sometimes difficult to tell apart from true labor because they can be very uncomfortable. You should not feel embarrassed if you go to the  hospital with false labor. Sometimes, the only way to tell if you are in true labor is for your health care provider to look for changes in the cervix. The health care provider will do a physical exam and may monitor your contractions. If you are not in true labor, the exam should show that your cervix is not dilating and your water has not broken. If there are no other health problems associated with your pregnancy, it is completely safe for you to be sent home with false labor. You may continue to have Braxton Hicks contractions until you go into true labor. How to tell the difference between true labor and false labor True labor  Contractions last 30-70 seconds.  Contractions become very regular.  Discomfort is usually felt in the top of the uterus, and it spreads to the lower abdomen and low back.  Contractions do not go away with walking.  Contractions usually become more intense and increase in frequency.  The cervix dilates and gets thinner. False labor  Contractions are usually shorter and not as strong as true labor contractions.  Contractions are usually irregular.  Contractions are often felt in the front of the lower abdomen and in the groin.  Contractions may go away when you walk around or change positions while lying down.  Contractions get weaker and are shorter-lasting as time goes on.  The cervix usually does not dilate or become thin. Follow these instructions at home:   Take over-the-counter and prescription medicines only as told by your health care provider.  Keep up with your usual exercises and follow other instructions from your health care provider.  Eat and drink lightly if you think you are going into labor.  If Braxton Hicks contractions are making you uncomfortable: ? Change your position from lying down or resting to walking, or change from walking to resting. ? Sit and rest in a tub of warm water. ? Drink enough fluid to keep your urine pale  yellow. Dehydration may cause these contractions. ? Do slow and deep breathing several times an hour.  Keep all follow-up prenatal visits as told by your health care provider. This is important. Contact a health care provider if:  You have a fever.  You have continuous pain in your abdomen. Get help right away if:  Your contractions become stronger, more regular, and closer together.  You have fluid leaking or gushing from your vagina.  You pass blood-tinged mucus (bloody show).  You have bleeding from your vagina.  You have low back pain that you never had before.  You feel your babys head pushing down and causing pelvic pressure.  Your baby is not moving inside you as much as it used to. Summary  Contractions that occur before labor are called Braxton Hicks contractions, false labor, or practice contractions.  Braxton Hicks contractions are usually shorter, weaker, farther apart, and less  regular than true labor contractions. True labor contractions usually become progressively stronger and regular, and they become more frequent.  Manage discomfort from [facility] contractions by changing position, resting in a warm bath, drinking plenty of water, or practicing deep breathing. This information is not intended to replace advice given to you by your health care provider. Make sure you discuss any questions you have with your health care provider. Document Released: 10/29/2016 Document Revised: 05/28/2017 Document Reviewed: 10/29/2016 Elsevier Patient Education  2020 [employer].

## 2019-03-02 ENCOUNTER — Encounter: Payer: BC Managed Care – PPO | Admitting: Advanced Practice Midwife

## 2019-03-02 ENCOUNTER — Other Ambulatory Visit: Payer: BC Managed Care – PPO

## 2019-03-03 ENCOUNTER — Telehealth: Payer: Self-pay | Admitting: *Deleted

## 2019-03-03 NOTE — Telephone Encounter (Signed)
Pt aware all results are not back yet. Advised we should have results back Tuesday(office closed Monday). Pt voiced understanding. Zoe Armstrong

## 2019-03-03 NOTE — Telephone Encounter (Signed)
Pt called wanting to see if her results are back.

## 2019-03-04 LAB — COMPREHENSIVE METABOLIC PANEL
ALT: 7 IU/L (ref 0–32)
AST: 9 IU/L (ref 0–40)
Albumin/Globulin Ratio: 1.6 (ref 1.2–2.2)
Albumin: 3.4 g/dL — ABNORMAL LOW (ref 3.9–5.0)
Alkaline Phosphatase: 146 IU/L — ABNORMAL HIGH (ref 39–117)
BUN/Creatinine Ratio: 15 (ref 9–23)
BUN: 8 mg/dL (ref 6–20)
Bilirubin Total: <0.2 mg/dL (ref 0.0–1.2)
CO2: 17 mmol/L — ABNORMAL LOW (ref 20–29)
Calcium: 8.4 mg/dL — ABNORMAL LOW (ref 8.7–10.2)
Chloride: 107 mmol/L — ABNORMAL HIGH (ref 96–106)
Creatinine, Ser: 0.52 mg/dL — ABNORMAL LOW (ref 0.57–1.00)
GFR calc Af Amer: 149 mL/min/{1.73_m2} (ref >59)
GFR calc non Af Amer: 130 mL/min/{1.73_m2} (ref >59)
Globulin, Total: 2.1 g/dL (ref 1.5–4.5)
Glucose: 76 mg/dL (ref 65–99)
Potassium: 4.3 mmol/L (ref 3.5–5.2)
Sodium: 139 mmol/L (ref 134–144)
Total Protein: 5.5 g/dL — ABNORMAL LOW (ref 6.0–8.5)

## 2019-03-04 LAB — BILE ACIDS, TOTAL: Bile Acids Total: <1 umol/L (ref 0.0–10.0)

## 2019-03-07 ENCOUNTER — Ambulatory Visit (INDEPENDENT_AMBULATORY_CARE_PROVIDER_SITE_OTHER): Payer: BC Managed Care – PPO | Admitting: Obstetrics and Gynecology

## 2019-03-07 ENCOUNTER — Telehealth (HOSPITAL_COMMUNITY): Payer: Self-pay | Admitting: *Deleted

## 2019-03-07 ENCOUNTER — Encounter (HOSPITAL_COMMUNITY): Payer: Self-pay | Admitting: *Deleted

## 2019-03-07 ENCOUNTER — Encounter: Payer: Self-pay | Admitting: Obstetrics and Gynecology

## 2019-03-07 ENCOUNTER — Other Ambulatory Visit: Payer: Self-pay

## 2019-03-07 ENCOUNTER — Other Ambulatory Visit: Payer: Self-pay | Admitting: Obstetrics and Gynecology

## 2019-03-07 ENCOUNTER — Other Ambulatory Visit: Payer: Self-pay | Admitting: Women's Health

## 2019-03-07 VITALS — Wt 186.2 lb

## 2019-03-07 DIAGNOSIS — Z331 Pregnant state, incidental: Secondary | ICD-10-CM

## 2019-03-07 DIAGNOSIS — O480 Post-term pregnancy: Secondary | ICD-10-CM

## 2019-03-07 DIAGNOSIS — Z3A40 40 weeks gestation of pregnancy: Secondary | ICD-10-CM

## 2019-03-07 DIAGNOSIS — Z3403 Encounter for supervision of normal first pregnancy, third trimester: Secondary | ICD-10-CM

## 2019-03-07 DIAGNOSIS — Z1389 Encounter for screening for other disorder: Secondary | ICD-10-CM

## 2019-03-07 LAB — POCT URINALYSIS DIPSTICK OB
Blood, UA: NEGATIVE
Glucose, UA: NEGATIVE
Ketones, UA: NEGATIVE
Leukocytes, UA: NEGATIVE
Nitrite, UA: NEGATIVE
POC,PROTEIN,UA: NEGATIVE

## 2019-03-07 NOTE — Progress Notes (Signed)
Patient ID: Zoe Armstrong, female   DOB: [birth year], 28 y.o.   MRN: [id number]    LOW-RISK PREGNANCY VISIT Patient name: Zoe Armstrong MRN [id number]  Date of birth: [birth year] Chief Complaint:   Non-stress Test  History of Present Illness:   Murry Khiev is a 28 y.o. G1P0000 female at [redacted]w[redacted]d with an Estimated Date of Delivery: 03/05/19 being seen today for ongoing management of a low-risk pregnancy, and scheduling of IOL.Marland Kitchen Plans to breastfeed after delivery. BC OCP Today she reports no complaints. Contractions: Not present. Vag. Bleeding: None.  Movement: Present. denies leaking of fluid. Review of Systems:   Pertinent items are noted in HPI Denies abnormal vaginal discharge w/ itching/odor/irritation, headaches, visual changes, shortness of breath, chest pain, abdominal pain, severe nausea/vomiting, or problems with urination or bowel movements unless otherwise stated above. Pertinent History Reviewed:  Reviewed past medical,surgical, social, obstetrical and family history.  Reviewed problem list, medications and allergies. Physical Assessment:   Vitals:   03/07/19 0847  Weight: 186 lb 3.2 oz (84.5 kg)  Body mass index is 32.98 kg/m.        Physical Examination:   General appearance: Well appearing, and in no distress  Mental status: Alert, oriented to person, place, and time  Skin: Warm & dry  Cardiovascular: Normal heart rate noted  Respiratory: Normal respiratory effort, no distress  Abdomen: Soft, gravid, nontender  Pelvic: Cervical exam deferred         Extremities: Edema: Moderate pitting, indentation subsides rapidly  Fetal Status:   Fundal Height: 36 cm Movement: Present    No results found for this or any previous visit (from the past 24 hour(s)).  Assessment & Plan:  1) Low-risk pregnancy G1P0000 at [redacted]w[redacted]d with an Estimated Date of Delivery: 03/05/19   2. Postdates pregnancy scheduled of IOL for midnight Sunday 9/13 Meds: No orders of the defined types were placed in this  encounter.  Labs/procedures today: NST, reactive  Plan:  IOL 03/12/2019 [place] after delivery, then switch to normal OCP 5 week PP  Reviewed: Term labor symptoms and general obstetric precautions including but not limited to vaginal bleeding, contractions, leaking of fluid and fetal movement were reviewed in detail with the patient.  All questions were answered.   Follow-up: No follow-ups on file.  No orders of the defined types were placed in this encounter.  By signing my name below, I, Samul Dada, attest that this documentation has been prepared under the direction and in the presence of Jonnie Kind, MD. Electronically Signed: [place]. 03/07/19. 9:19 AM.  I personally performed the services described in this documentation, which was SCRIBED in my presence. The recorded information has been reviewed and considered accurate. It has been edited as necessary during review. Jonnie Kind, MD

## 2019-03-07 NOTE — Addendum Note (Signed)
Addended by: Octaviano Glow on: 03/07/2019 09:51 AM   Modules accepted: Orders

## 2019-03-07 NOTE — Telephone Encounter (Signed)
Preadmission screen  

## 2019-03-08 ENCOUNTER — Encounter (HOSPITAL_COMMUNITY): Payer: Self-pay | Admitting: Emergency Medicine

## 2019-03-08 ENCOUNTER — Other Ambulatory Visit: Payer: Self-pay

## 2019-03-08 ENCOUNTER — Emergency Department (HOSPITAL_COMMUNITY)
Admission: AD | Admit: 2019-03-08 | Discharge: 2019-03-09 | Discharge status: Against Medical Advice | Payer: BC Managed Care – PPO | Source: Home / Self Care | Attending: Obstetrics & Gynecology | Admitting: Obstetrics & Gynecology

## 2019-03-08 ENCOUNTER — Telehealth: Payer: BC Managed Care – PPO | Admitting: Family

## 2019-03-08 DIAGNOSIS — O480 Post-term pregnancy: Secondary | ICD-10-CM | POA: Diagnosis not present

## 2019-03-08 DIAGNOSIS — Z20822 Contact with and (suspected) exposure to covid-19: Secondary | ICD-10-CM

## 2019-03-08 DIAGNOSIS — Z5321 Procedure and treatment not carried out due to patient leaving prior to being seen by health care provider: Secondary | ICD-10-CM | POA: Insufficient documentation

## 2019-03-08 DIAGNOSIS — Z3A40 40 weeks gestation of pregnancy: Secondary | ICD-10-CM | POA: Diagnosis not present

## 2019-03-08 DIAGNOSIS — O2303 Infections of kidney in pregnancy, third trimester: Secondary | ICD-10-CM | POA: Diagnosis not present

## 2019-03-08 NOTE — Progress Notes (Signed)
E-Visit for Corona Virus Screening   Your current symptoms could be consistent with the coronavirus.  Many health care providers can now test patients at their office but not all are.  [facility] has multiple testing sites. For information on our COVID testing locations and hours go to [email or web address]  Please quarantine yourself while awaiting your test results.  We are enrolling you in our [place] for [place] . Daily you will receive a questionnaire within the [facility] website. Our COVID 19 response team willl be monitoriing your responses daily.  You can go to one of the  testing sites listed below, while they are opened (see hours). You do not need an order and will stay in your car during the test. You do need to self isolate until your results return and if positive 14 days from when your symptoms started and until you are 3 days symptom free.   Testing Locations (Monday - Friday, 8 a.m. - 3:30 p.m.) . [place]: [facility], [address] [place], [place]: [place], [place], [place], [place] (entrance off [address])  . [facility]: (Closed each Monday): Testing site relocated to the short stay covered drive at [facility]. (Use the [employer] entrance to [facility] next to [facility].) Approximately 5 minutes was spent documenting and reviewing patient's chart.    COVID-19 is a respiratory illness with symptoms that are similar to the flu. Symptoms are typically mild to moderate, but there have been cases of severe illness and death due to the virus. The following symptoms may appear 2-14 days after exposure: . Fever . Cough . Shortness of breath or difficulty breathing . Chills . Repeated shaking with chills . Muscle pain . Headache . Sore throat . New loss of taste or smell . Fatigue . Congestion or runny  nose . Nausea or vomiting . Diarrhea  It is vitally important that if you feel that you have an infection such as this virus or any other virus that you stay home and away from places where you may spread it to others.  You should self-quarantine for 14 days if you have symptoms that could potentially be coronavirus or have been in close contact a with a person diagnosed with COVID-19 within the last 2 weeks. You should avoid contact with people age 43 and older.   You should wear a mask or cloth face covering over your nose and mouth if you must be around other people or animals, including pets (even at home). Try to stay at least 6 feet away from other people. This will protect the people around you.    You may also take acetaminophen (Tylenol) as needed for fever.   Reduce your risk of any infection by using the same precautions used for avoiding the common cold or flu:  Marland Kitchen Wash your hands often with soap and warm water for at least 20 seconds.  If soap and water are not readily available, use an alcohol-based hand sanitizer with at least 60% alcohol.  . If coughing or sneezing, cover your mouth and nose by coughing or sneezing into the elbow areas of your shirt or coat, into a tissue or into your sleeve (not your hands). . Avoid shaking hands with others and consider head nods or verbal greetings only. . Avoid touching your eyes, nose, or mouth with unwashed hands.  . Avoid close contact with people  are sick. . Avoid places or events with large numbers of people in one location, like concerts or sporting events. . Carefully consider travel plans you have or are making. . If you are planning any travel outside or inside the [place], visit the CDC's Travelers' Health webpage for the latest health notices. . If you have some symptoms but not all symptoms, continue to monitor at home and seek medical attention if your symptoms worsen. . If you are having a medical emergency, call 911.  HOME  CARE . Only take medications as instructed by your medical team. . Drink plenty of fluids and get plenty of rest. . A steam or ultrasonic humidifier can help if you have congestion.   GET HELP RIGHT AWAY IF YOU HAVE EMERGENCY WARNING SIGNS** FOR COVID-19. If you or someone is showing any of these signs seek emergency medical care immediately. Call 911 or proceed to your closest emergency facility if: . You develop worsening high fever. . Trouble breathing . Bluish lips or face . Persistent pain or pressure in the chest . New confusion . Inability to wake or stay awake . You cough up blood. . Your symptoms become more severe  **This list is not all possible symptoms. Contact your medical provider for any symptoms that are sever or concerning to you.   MAKE SURE YOU   Understand these instructions.  Will watch your condition.  Will get help right away if you are not doing well or get worse.  Your e-visit answers were reviewed by a board certified advanced clinical practitioner to complete your personal care plan.  Depending on the condition, your plan could have included both over the counter or prescription medications.  If there is a problem please reply once you have received a response from your provider.  Your safety is important to us.  If you have drug allergies check your prescription carefully.    You can use MyChart to ask questions about today's visit, request a non-urgent call back, or ask for a work or school excuse for 24 hours related to this e-Visit. If it has been greater than 24 hours you will need to follow up with your provider, or enter a new e-Visit to address those concerns. You will get an e-mail in the next two days asking about your experience.  I hope that your e-visit has been valuable and will speed your recovery. Thank you for using e-visits.    

## 2019-03-08 NOTE — ED Triage Notes (Signed)
Patient reports generalized weakness,chills and fatigue onset today . She is [redacted] weeks pregnant G1P0 , denies fever or abdominal cramping.

## 2019-03-08 NOTE — MAU Note (Signed)
MAU providers notified and stated pt to be transported to [facility] for symptoms. Santiago Glad Therapist, sports at [employer]. Pt transported.

## 2019-03-08 NOTE — MAU Note (Signed)
Pt reports to MAU c/o fatigue due to lack of sleep. Pt reports some chills none currently. Pt denies exposure to COVID. Pt reports having a stuffy nose but that has been a ongoing problem. Pt denies abdominal pain or vaginal bleeding and reports normal vaginal discharge. Pt reports panties getting damp but she was checked last week and it was nothing. Pt reports the discharge has not changed since last week. Pt reports her cervix is closed.

## 2019-03-09 ENCOUNTER — Telehealth: Payer: Self-pay | Admitting: *Deleted

## 2019-03-09 ENCOUNTER — Other Ambulatory Visit: Payer: Self-pay

## 2019-03-09 ENCOUNTER — Encounter (HOSPITAL_COMMUNITY): Payer: Self-pay

## 2019-03-09 ENCOUNTER — Inpatient Hospital Stay (HOSPITAL_COMMUNITY)
Admission: AD | Admit: 2019-03-09 | Discharge: 2019-03-13 | DRG: 768 | Disposition: A | Discharge status: Home / Self Care | Payer: BC Managed Care – PPO | Attending: Obstetrics and Gynecology | Admitting: Obstetrics and Gynecology

## 2019-03-09 DIAGNOSIS — O1205 Gestational edema, complicating the puerperium: Secondary | ICD-10-CM | POA: Diagnosis not present

## 2019-03-09 DIAGNOSIS — O411230 Chorioamnionitis, third trimester, not applicable or unspecified: Secondary | ICD-10-CM | POA: Diagnosis present

## 2019-03-09 DIAGNOSIS — Z3A40 40 weeks gestation of pregnancy: Secondary | ICD-10-CM | POA: Diagnosis not present

## 2019-03-09 DIAGNOSIS — Z87448 Personal history of other diseases of urinary system: Secondary | ICD-10-CM | POA: Diagnosis present

## 2019-03-09 DIAGNOSIS — Z20828 Contact with and (suspected) exposure to other viral communicable diseases: Secondary | ICD-10-CM | POA: Diagnosis present

## 2019-03-09 DIAGNOSIS — D649 Anemia, unspecified: Secondary | ICD-10-CM | POA: Diagnosis present

## 2019-03-09 DIAGNOSIS — O99214 Obesity complicating childbirth: Secondary | ICD-10-CM | POA: Diagnosis present

## 2019-03-09 DIAGNOSIS — O480 Post-term pregnancy: Secondary | ICD-10-CM | POA: Diagnosis present

## 2019-03-09 DIAGNOSIS — E669 Obesity, unspecified: Secondary | ICD-10-CM | POA: Diagnosis present

## 2019-03-09 DIAGNOSIS — O2303 Infections of kidney in pregnancy, third trimester: Secondary | ICD-10-CM

## 2019-03-09 DIAGNOSIS — O2300 Infections of kidney in pregnancy, unspecified trimester: Secondary | ICD-10-CM

## 2019-03-09 DIAGNOSIS — O9902 Anemia complicating childbirth: Secondary | ICD-10-CM | POA: Diagnosis present

## 2019-03-09 DIAGNOSIS — R000 Tachycardia, unspecified: Secondary | ICD-10-CM

## 2019-03-09 LAB — CBC WITH DIFFERENTIAL/PLATELET
Abs Immature Granulocytes: 0.05 10*3/uL (ref 0.00–0.07)
Abs Immature Granulocytes: 0.08 10*3/uL — ABNORMAL HIGH (ref 0.00–0.07)
Basophils Absolute: 0 10*3/uL (ref 0.0–0.1)
Basophils Absolute: 0 10*3/uL (ref 0.0–0.1)
Basophils Relative: 0 %
Basophils Relative: 0 %
Eosinophils Absolute: 0 10*3/uL (ref 0.0–0.5)
Eosinophils Absolute: 0.1 10*3/uL (ref 0.0–0.5)
Eosinophils Relative: 0 %
Eosinophils Relative: 1 %
HCT: 30.2 % — ABNORMAL LOW (ref 36.0–46.0)
HCT: 30.8 % — ABNORMAL LOW (ref 36.0–46.0)
Hemoglobin: 9.6 g/dL — ABNORMAL LOW (ref 12.0–15.0)
Hemoglobin: 9.8 g/dL — ABNORMAL LOW (ref 12.0–15.0)
Immature Granulocytes: 0 %
Immature Granulocytes: 1 %
Lymphocytes Relative: 12 %
Lymphocytes Relative: 15 %
Lymphs Abs: 1.4 10*3/uL (ref 0.7–4.0)
Lymphs Abs: 1.7 10*3/uL (ref 0.7–4.0)
MCH: 26.9 pg (ref 26.0–34.0)
MCH: 27.2 pg (ref 26.0–34.0)
MCHC: 31.8 g/dL (ref 30.0–36.0)
MCHC: 31.8 g/dL (ref 30.0–36.0)
MCV: 84.6 fL (ref 80.0–100.0)
MCV: 85.6 fL (ref 80.0–100.0)
Monocytes Absolute: 0.7 10*3/uL (ref 0.1–1.0)
Monocytes Absolute: 1 10*3/uL (ref 0.1–1.0)
Monocytes Relative: 7 %
Monocytes Relative: 9 %
Neutro Abs: 8.5 10*3/uL — ABNORMAL HIGH (ref 1.7–7.7)
Neutro Abs: 8.9 10*3/uL — ABNORMAL HIGH (ref 1.7–7.7)
Neutrophils Relative %: 75 %
Neutrophils Relative %: 80 %
Platelets: 210 10*3/uL (ref 150–400)
Platelets: 213 10*3/uL (ref 150–400)
RBC: 3.57 MIL/uL — ABNORMAL LOW (ref 3.87–5.11)
RBC: 3.6 MIL/uL — ABNORMAL LOW (ref 3.87–5.11)
RDW: 14.6 % (ref 11.5–15.5)
RDW: 14.7 % (ref 11.5–15.5)
WBC: 11.1 10*3/uL — ABNORMAL HIGH (ref 4.0–10.5)
WBC: 11.4 10*3/uL — ABNORMAL HIGH (ref 4.0–10.5)
nRBC: 0 % (ref 0.0–0.2)
nRBC: 0 % (ref 0.0–0.2)

## 2019-03-09 LAB — COMPREHENSIVE METABOLIC PANEL
ALT: 11 U/L (ref 0–44)
AST: 15 U/L (ref 15–41)
Albumin: 2.5 g/dL — ABNORMAL LOW (ref 3.5–5.0)
Alkaline Phosphatase: 147 U/L — ABNORMAL HIGH (ref 38–126)
Anion gap: 9 (ref 5–15)
BUN: 6 mg/dL (ref 6–20)
CO2: 18 mmol/L — ABNORMAL LOW (ref 22–32)
Calcium: 8.3 mg/dL — ABNORMAL LOW (ref 8.9–10.3)
Chloride: 106 mmol/L (ref 98–111)
Creatinine, Ser: 0.64 mg/dL (ref 0.44–1.00)
GFR calc Af Amer: >60 mL/min (ref >60)
GFR calc non Af Amer: >60 mL/min (ref >60)
Glucose, Bld: 116 mg/dL — ABNORMAL HIGH (ref 70–99)
Potassium: 3.2 mmol/L — ABNORMAL LOW (ref 3.5–5.1)
Sodium: 133 mmol/L — ABNORMAL LOW (ref 135–145)
Total Bilirubin: 0.3 mg/dL (ref 0.3–1.2)
Total Protein: 5.7 g/dL — ABNORMAL LOW (ref 6.5–8.1)

## 2019-03-09 LAB — BASIC METABOLIC PANEL
Anion gap: 8 (ref 5–15)
BUN: 7 mg/dL (ref 6–20)
CO2: 19 mmol/L — ABNORMAL LOW (ref 22–32)
Calcium: 8.7 mg/dL — ABNORMAL LOW (ref 8.9–10.3)
Chloride: 107 mmol/L (ref 98–111)
Creatinine, Ser: 0.59 mg/dL (ref 0.44–1.00)
GFR calc Af Amer: >60 mL/min (ref >60)
GFR calc non Af Amer: >60 mL/min (ref >60)
Glucose, Bld: 88 mg/dL (ref 70–99)
Potassium: 3.9 mmol/L (ref 3.5–5.1)
Sodium: 134 mmol/L — ABNORMAL LOW (ref 135–145)

## 2019-03-09 LAB — URINALYSIS, ROUTINE W REFLEX MICROSCOPIC
Bilirubin Urine: NEGATIVE
Glucose, UA: NEGATIVE mg/dL
Hgb urine dipstick: NEGATIVE
Ketones, ur: 80 mg/dL — AB
Nitrite: NEGATIVE
Protein, ur: NEGATIVE mg/dL
Specific Gravity, Urine: 1.008 (ref 1.005–1.030)
pH: 6 (ref 5.0–8.0)

## 2019-03-09 LAB — TYPE AND SCREEN
ABO/RH(D): A POS
Antibody Screen: NEGATIVE

## 2019-03-09 LAB — SARS CORONAVIRUS 2 BY RT PCR (HOSPITAL ORDER, PERFORMED IN ~~LOC~~ HOSPITAL LAB): SARS Coronavirus 2: NEGATIVE

## 2019-03-09 LAB — LACTIC ACID, PLASMA: Lactic Acid, Venous: 2 mmol/L (ref 0.5–1.9)

## 2019-03-09 MED ORDER — CEPHALEXIN 500 MG PO CAPS: 500.0000 mg | ORAL_CAPSULE | Freq: Two times a day (BID) | ORAL | Status: DC

## 2019-03-09 MED ORDER — OXYCODONE-ACETAMINOPHEN 5-325 MG PO TABS
2.0000 | ORAL_TABLET | ORAL | Status: DC | PRN
  Administered 2019-03-12: 1 via ORAL
  Administered 2019-03-13: 2 via ORAL
  Filled 2019-03-09 (×2): qty 2

## 2019-03-09 MED ORDER — ONDANSETRON HCL 4 MG/2ML IJ SOLN: 4.0000 mg | Freq: Four times a day (QID) | INTRAMUSCULAR | Status: DC | PRN

## 2019-03-09 MED ORDER — LACTATED RINGERS IV SOLN: INTRAVENOUS | Status: DC

## 2019-03-09 MED ORDER — MISOPROSTOL 50MCG HALF TABLET
50.0000 ug | ORAL_TABLET | ORAL | Status: DC | PRN
  Administered 2019-03-09 – 2019-03-10 (×3): 50 ug via BUCCAL
  Filled 2019-03-09 (×3): qty 1

## 2019-03-09 MED ORDER — ACETAMINOPHEN 325 MG PO TABS
650.0000 mg | ORAL_TABLET | ORAL | Status: DC | PRN
  Administered 2019-03-10 (×3): 650 mg via ORAL
  Filled 2019-03-09 (×3): qty 2

## 2019-03-09 MED ORDER — SODIUM CHLORIDE 0.9 % IV SOLN
INTRAVENOUS | Status: DC
  Administered 2019-03-09: 21:00:00 via INTRAVENOUS

## 2019-03-09 MED ORDER — LIDOCAINE HCL (PF) 1 % IJ SOLN
30.0000 mL | INTRAMUSCULAR | Status: AC | PRN
  Administered 2019-03-11: 30 mL via SUBCUTANEOUS
  Filled 2019-03-09: qty 30

## 2019-03-09 MED ORDER — TERBUTALINE SULFATE 1 MG/ML IJ SOLN
0.2500 mg | Freq: Once | INTRAMUSCULAR | Status: AC | PRN
  Administered 2019-03-10: 0.25 mg via SUBCUTANEOUS
  Filled 2019-03-09: qty 1

## 2019-03-09 MED ORDER — MISOPROSTOL 50MCG HALF TABLET: 50.0000 ug | ORAL_TABLET | ORAL | Status: DC

## 2019-03-09 MED ORDER — SOD CITRATE-CITRIC ACID 500-334 MG/5ML PO SOLN: 30.0000 mL | ORAL | Status: DC | PRN

## 2019-03-09 MED ORDER — LACTATED RINGERS IV SOLN
500.0000 mL | INTRAVENOUS | Status: DC | PRN
  Administered 2019-03-10: 16:00:00 500 mL via INTRAVENOUS
  Administered 2019-03-10: 1000 mL via INTRAVENOUS
  Administered 2019-03-10: 15:00:00 500 mL via INTRAVENOUS

## 2019-03-09 MED ORDER — OXYTOCIN BOLUS FROM INFUSION
500.0000 mL | Freq: Once | INTRAVENOUS | Status: AC
  Administered 2019-03-11: 500 mL via INTRAVENOUS

## 2019-03-09 MED ORDER — SODIUM CHLORIDE 0.9 % IV SOLN: 2.0000 g | Freq: Four times a day (QID) | INTRAVENOUS | Status: DC

## 2019-03-09 MED ORDER — OXYTOCIN 40 UNITS IN NORMAL SALINE INFUSION - SIMPLE MED
2.5000 [IU]/h | INTRAVENOUS | Status: DC
  Administered 2019-03-11: 2.5 [IU]/h via INTRAVENOUS

## 2019-03-09 MED ORDER — ACETAMINOPHEN 500 MG PO TABS
1000.0000 mg | ORAL_TABLET | Freq: Once | ORAL | Status: AC
  Administered 2019-03-09: 1000 mg via ORAL
  Filled 2019-03-09: qty 2

## 2019-03-09 MED ORDER — SODIUM CHLORIDE 0.9 % IV SOLN
2.0000 g | INTRAVENOUS | Status: DC
  Administered 2019-03-09 – 2019-03-11 (×3): 2 g via INTRAVENOUS
  Filled 2019-03-09 (×2): qty 20
  Filled 2019-03-09 (×2): qty 2

## 2019-03-09 MED ORDER — LACTATED RINGERS IV SOLN
INTRAVENOUS | Status: DC
  Administered 2019-03-09 – 2019-03-11 (×5): via INTRAVENOUS

## 2019-03-09 MED ORDER — OXYCODONE-ACETAMINOPHEN 5-325 MG PO TABS: 1.0000 | ORAL_TABLET | ORAL | Status: DC | PRN

## 2019-03-09 NOTE — MAU Provider Note (Signed)
Pt informed that the ultrasound is considered a limited OB ultrasound and is not intended to be a complete ultrasound exam.  Patient also informed that the ultrasound is not being completed with the intent of assessing for fetal or placental anomalies or any pelvic abnormalities.  Explained that the purpose of today's ultrasound is to assess for  presentation (VTX).  Patient acknowledges the purpose of the exam and the limitations of the study.    Clarisa Fling, NP  9:31 PM 03/09/2019

## 2019-03-09 NOTE — Telephone Encounter (Signed)
Patient needs refill for Keflex sent to pharmacy for UTI suppression. Says she has had a fever, chills, fatigue and right flank pain. Says she feels like she did earlier in the pregnancy when she had a UTI.  Spoke with Manya Silvas, CNM who advised she go to MAU for pyelo eval.  Pt informed and verbalized understanding.

## 2019-03-09 NOTE — MAU Provider Note (Signed)
History     CSN: [id number]  Arrival date and time: 03/09/19 1901   First Provider Initiated Contact with Patient 03/09/19 2005      Chief Complaint  Patient presents with  . uti symptoms   HPI   Ms.Zoe Armstrong is a 28 y.o. female G1P0000 @ [redacted]w[redacted]d with a history of frequent pyelo/UTI's in this pregnancy currently taking macrobid nightly for suppression. The last dose she took was last night. She presented to the MAU yesterday with fatigue and chills, she was sent to the ED for covid screening and left after the ED told her she would wait 6-8 hours to be seen. She says over the last 24 hours she has gotten worse. She now feels bad. She has a fever and right flank pain. No other respiratory symptoms. + fetal movement.   OB History    Gravida  1   Para  0   Term  0   Preterm  0   AB  0   Living  0     SAB  0   TAB  0   Ectopic  0   Multiple  0   Live Births              Past Medical History:  Diagnosis Date  . Acne   . Anxiety 07/25/2014  . Encounter for menstrual regulation 12/24/2015  . Fatigue 12/24/2015  . Menstrual extraction 04/10/2013  . Weight gain 12/24/2015    Past Surgical History:  Procedure Laterality Date  . NO PAST SURGERIES      Family History  Problem Relation Age of Onset  . Cancer Mother        breast   . Thyroid disease Mother   . Cancer Maternal Uncle        lung that spread everywhere  . Diabetes Paternal Aunt   . Heart attack Paternal Aunt   . Cancer Maternal Grandfather   . Heart disease Paternal Grandfather   . Schizophrenia Paternal Grandmother   . Heart attack Paternal Grandmother   . Alzheimer's disease Maternal Grandmother   . Thyroid disease Maternal Aunt     Social History   Tobacco Use  . Smoking status: Never Smoker  . Smokeless tobacco: Never Used  Substance Use Topics  . Alcohol use: No  . Drug use: No    Allergies: No Known Allergies  Medications Prior to Admission  Medication Sig Dispense Refill  Last Dose  . Blood Pressure Monitoring (BLOOD PRESSURE CUFF) MISC For regular bp monitoring at home 1 each 0 03/09/2019 at Unknown time  . nitrofurantoin, macrocrystal-monohydrate, (MACROBID) 100 MG capsule Take 100 mg by mouth at bedtime.   03/08/2019 at Unknown time  . Prenatal Vit-Fe Fumarate-FA (PRENATAL VITAMIN PO) Take by mouth daily.   03/08/2019 at Unknown time   Results for orders placed or performed during the hospital encounter of 03/09/19 (from the past 48 hour(s))  Urinalysis, Routine w reflex microscopic     Status: Abnormal   Collection Time: 03/09/19  7:30 PM  Result Value Ref Range   Color, Urine YELLOW YELLOW   APPearance CLEAR CLEAR   Specific Gravity, Urine 1.008 1.005 - 1.030   pH 6.0 5.0 - 8.0   Glucose, UA NEGATIVE NEGATIVE mg/dL   Hgb urine dipstick NEGATIVE NEGATIVE   Bilirubin Urine NEGATIVE NEGATIVE   Ketones, ur 80 (A) NEGATIVE mg/dL   Protein, ur NEGATIVE NEGATIVE mg/dL   Nitrite NEGATIVE NEGATIVE   Leukocytes,Ua SMALL (A) NEGATIVE  RBC / HPF 0-5 0 - 5 RBC/hpf   WBC, UA 11-20 0 - 5 WBC/hpf   Bacteria, UA MANY (A) NONE SEEN   Squamous Epithelial / LPF 0-5 0 - 5    Comment: Performed at [facility], [address] [place], [place] [address]   Review of Systems  Constitutional: Positive for fever.  Respiratory: Negative for cough and wheezing.   Genitourinary: Positive for flank pain. Negative for dysuria, frequency and urgency.   Physical Exam   Blood pressure 136/68, pulse (!) 137, temperature 99.8 F (37.7 C), resp. rate 16, height 5\' 3"  (1.6 m), weight 84.8 kg, last menstrual period 05/29/2018, SpO2 97 %.  Physical Exam  Constitutional: She is oriented to person, place, and time. She appears well-developed. She has a sickly appearance. She appears ill.  Respiratory: Effort normal.  GI: Soft. She exhibits no distension. There is no abdominal tenderness. There is CVA tenderness (Right side only). There is no rebound and no guarding.   Musculoskeletal: Normal range of motion.  Neurological: She is alert and oriented to person, place, and time.  Skin: Skin is warm.  Psychiatric: Her behavior is normal.   Fetal Tracing: Baseline: 160 bpm Variability: moderate  Accelerations: 15x15 Decelerations: None Toco: Occasional   MAU Course  Procedures  None  MDM  Temp 100.2, tachycardia, hx of frequent UTI's and Right flank pain. Symptoms consistent with pyelonephritis. Covid testing pending. Will admit to labor and delivery once reported Lactic acid Rocphin started Discussed patient with Dr. Jolayne Pantheronstant. Patient to go to Labor and delivery for induction.   Assessment and Plan   A:  1. Pyelonephritis affecting pregnancy in third trimester   2. [redacted] weeks gestation of pregnancy   3. Tachycardia     P:  Admit to labor and delivery Thalia BloodgoodSam Weinhold CNM aware Rocphin started  Covid pending Tylenol given 1 gram.  Rasch, Zoe RutherfordJennifer I, NP 03/09/2019 8:51 PM

## 2019-03-09 NOTE — MAU Note (Signed)
Hx uti during this pregnancy. Take Macrobid nightly to prevent uti. Treated with Keflex for uti 3wks ago. Now back on Macrobid. Have chills and just feel tired with R flank pain. Denies dysuria. Some white d/c but is not new. This is how usually feels when have uti

## 2019-03-09 NOTE — MAU Note (Signed)
CRITICAL VALUE ALERT  Critical Value: Lactic Acid 2.0  Date & Time Notied: 03/09/19   Provider Notified: Hansel Feinstein, CNM

## 2019-03-09 NOTE — MAU Note (Signed)
Covid swab obtained without difficulty and pt tol well. Pt has fever and fatigue but feels is due to uti. Has hx uti with this pregnancy and presented tonight with flank pain and fever

## 2019-03-09 NOTE — H&P (Addendum)
Zoe Armstrong is a 28 y.o. female presenting for PDIOL and concern for Pyelonephritis. At time of admission she denies vaginal bleeding, leaking of fluid, decreased fetal movement, fever, falls, or recent illness. She endorses new onset fever and right flank pain in setting of recurrent UTI, on suppression with daily Macrobid.  PRENATAL  -Care at [facility] -Dating by LMP c/w 7 week [place] -Care initiated at [redacted] weeks GA -Low risk NIPS -Rubella Immune  OB History    Gravida  1   Para  0   Term  0   Preterm  0   AB  0   Living  0     SAB  0   TAB  0   Ectopic  0   Multiple  0   Live Births             Patient Active Problem List   Diagnosis Date Noted  . Supervision of normal first pregnancy 08/11/2018  . Recurrent UTI 07/19/2018  . Anxiety 07/25/2014   Past Medical History:  Diagnosis Date  . Acne   . Anxiety 07/25/2014  . Encounter for menstrual regulation 12/24/2015  . Fatigue 12/24/2015  . Menstrual extraction 04/10/2013  . Weight gain 12/24/2015   Past Surgical History:  Procedure Laterality Date  . NO PAST SURGERIES     Family History: family history includes Alzheimer's disease in her maternal grandmother; Cancer in her maternal grandfather, maternal uncle, and mother; Diabetes in her paternal aunt; Heart attack in her paternal aunt and paternal grandmother; Heart disease in her paternal grandfather; Schizophrenia in her paternal grandmother; Thyroid disease in her maternal aunt and mother. Social History:  reports that she has never smoked. She has never used smokeless tobacco. She reports that she does not drink alcohol or use drugs.     Maternal Diabetes: No Genetic Screening: Normal Maternal Ultrasounds/Referrals: Normal Fetal Ultrasounds or other Referrals:  None Maternal Substance Abuse:  No Significant Maternal Medications:  None Significant Maternal Lab Results:  Group B Strep negative Other Comments:  Daily suppression with Macrobid for  recurrent UTI  Review of Systems  Constitutional: Positive for fever. Negative for chills.  Respiratory: Negative for cough and shortness of breath.   Cardiovascular: Negative for palpitations.  Gastrointestinal: Negative for abdominal pain.  Genitourinary: Positive for dysuria and flank pain.  Musculoskeletal: Negative for back pain.  Neurological: Negative.  Negative for headaches.     Blood pressure 136/68, pulse (!) 137, temperature 99.8 F (37.7 C), resp. rate 16, height 5\' 3"  (1.6 m), weight 84.8 kg, last menstrual period 05/29/2018, SpO2 97 %. Physical Exam  Nursing note and vitals reviewed. Constitutional: She is oriented to person, place, and time. She appears well-developed and well-nourished. She appears ill.  Cardiovascular: Normal rate.  Respiratory: Effort normal and breath sounds normal. No respiratory distress.  GI: Soft. She exhibits no distension. There is no abdominal tenderness. There is CVA tenderness. There is no rebound and no guarding.  Right sided CVAT  Neurological: She is alert and oriented to person, place, and time.  Skin: Skin is warm and dry.  Psychiatric: She has a normal mood and affect. Her behavior is normal. Judgment and thought content normal.    Prenatal labs: ABO, Rh: A/Positive/-- (02/13 1653) Antibody: Negative (06/15 0929) Rubella: 1.93 (02/13 1653) RPR: Non Reactive (06/15 0929)  HBsAg: Negative (02/13 1653)  HIV: Non Reactive (06/15 0929)  GBS: --Hend[place] Cloud (08/11 1400)   Fetal Surveillance Category I tracing Baseline 150, moderate  variability, positive accels, no decels Toco: irregular mild ctx  Assessment/Plan: --28 y.o. G1P0000 at 3739w4d  --PDIOL, cervix closed in clinic 03/01/19 --Concern for Pyelonephritis. Rocephin 2 g given in MAU --S/p Pharmacy consult, advised continuation of Rocephin 2g q 24 hours for Pyelo --Per Dr. Jolayne Pantheronstant, serial Lactic Acid labs not indicated --Category I tracing --Desires unmedicated delivery but  open to epidural if poor coping --Patient hasn't eaten since 1845. Will have snack now. Plan to check cervix, place Cytotec at 2330 --Girl/breast/POPs  Calvert CantorSamantha C Dinna Severs, CNM 03/09/2019, 11:13 PM

## 2019-03-09 NOTE — ED Notes (Signed)
Pt stated she is leaving. 

## 2019-03-10 ENCOUNTER — Other Ambulatory Visit (HOSPITAL_COMMUNITY)
Admission: RE | Admit: 2019-03-10 | Discharge: 2019-03-10 | Disposition: A | Discharge status: Home / Self Care | Payer: BC Managed Care – PPO | Source: Ambulatory Visit | Attending: Family Medicine | Admitting: Family Medicine

## 2019-03-10 ENCOUNTER — Inpatient Hospital Stay (HOSPITAL_COMMUNITY): Payer: BC Managed Care – PPO | Admitting: Anesthesiology

## 2019-03-10 ENCOUNTER — Encounter (HOSPITAL_COMMUNITY): Payer: Self-pay | Admitting: Anesthesiology

## 2019-03-10 LAB — ABO/RH: ABO/RH(D): A POS

## 2019-03-10 LAB — BASIC METABOLIC PANEL
Anion gap: 9 (ref 5–15)
BUN: 5 mg/dL — ABNORMAL LOW (ref 6–20)
CO2: 17 mmol/L — ABNORMAL LOW (ref 22–32)
Calcium: 8.3 mg/dL — ABNORMAL LOW (ref 8.9–10.3)
Chloride: 108 mmol/L (ref 98–111)
Creatinine, Ser: 0.67 mg/dL (ref 0.44–1.00)
GFR calc Af Amer: >60 mL/min (ref >60)
GFR calc non Af Amer: >60 mL/min (ref >60)
Glucose, Bld: 92 mg/dL (ref 70–99)
Potassium: 3.6 mmol/L (ref 3.5–5.1)
Sodium: 134 mmol/L — ABNORMAL LOW (ref 135–145)

## 2019-03-10 LAB — RPR: RPR Ser Ql: NONREACTIVE

## 2019-03-10 LAB — LACTIC ACID, PLASMA: Lactic Acid, Venous: 1 mmol/L (ref 0.5–1.9)

## 2019-03-10 MED ORDER — LACTATED RINGERS IV SOLN
500.0000 mL | Freq: Once | INTRAVENOUS | Status: AC
  Administered 2019-03-10: 500 mL via INTRAVENOUS

## 2019-03-10 MED ORDER — ACETAMINOPHEN 325 MG PO TABS
650.0000 mg | ORAL_TABLET | ORAL | Status: DC
  Administered 2019-03-10 – 2019-03-11 (×5): 650 mg via ORAL
  Filled 2019-03-10 (×5): qty 2

## 2019-03-10 MED ORDER — EPHEDRINE 5 MG/ML INJ: 10.0000 mg | INTRAVENOUS | Status: DC | PRN

## 2019-03-10 MED ORDER — OXYTOCIN 40 UNITS IN NORMAL SALINE INFUSION - SIMPLE MED
1.0000 m[IU]/min | INTRAVENOUS | Status: DC
  Administered 2019-03-10: 2 m[IU]/min via INTRAVENOUS
  Filled 2019-03-10: qty 1000

## 2019-03-10 MED ORDER — GENTAMICIN SULFATE 40 MG/ML IJ SOLN
5.0000 mg/kg | INTRAVENOUS | Status: DC
  Administered 2019-03-11 – 2019-03-12 (×2): 330 mg via INTRAVENOUS
  Filled 2019-03-10 (×4): qty 8.25

## 2019-03-10 MED ORDER — SODIUM CHLORIDE 0.9 % IV SOLN
2.0000 g | Freq: Four times a day (QID) | INTRAVENOUS | Status: DC
  Administered 2019-03-10 – 2019-03-12 (×10): 2 g via INTRAVENOUS
  Filled 2019-03-10 (×2): qty 2
  Filled 2019-03-10 (×2): qty 2000
  Filled 2019-03-10: qty 2
  Filled 2019-03-10 (×10): qty 2000

## 2019-03-10 MED ORDER — SODIUM CHLORIDE (PF) 0.9 % IJ SOLN
INTRAMUSCULAR | Status: DC | PRN
  Administered 2019-03-10: 12 mL/h via EPIDURAL

## 2019-03-10 MED ORDER — DIPHENHYDRAMINE HCL 50 MG/ML IJ SOLN: 12.5000 mg | INTRAMUSCULAR | Status: DC | PRN

## 2019-03-10 MED ORDER — LACTATED RINGERS AMNIOINFUSION
INTRAVENOUS | Status: DC
  Administered 2019-03-10: 23:00:00 via INTRAUTERINE

## 2019-03-10 MED ORDER — LACTATED RINGERS IV SOLN: 500.0000 mL | Freq: Once | INTRAVENOUS | Status: DC

## 2019-03-10 MED ORDER — PHENYLEPHRINE 40 MCG/ML (10ML) SYRINGE FOR IV PUSH (FOR BLOOD PRESSURE SUPPORT)
80.0000 ug | PREFILLED_SYRINGE | INTRAVENOUS | Status: DC | PRN
  Administered 2019-03-10 (×2): 80 ug via INTRAVENOUS

## 2019-03-10 MED ORDER — FENTANYL-BUPIVACAINE-NACL 0.5-0.125-0.9 MG/250ML-% EP SOLN
12.0000 mL/h | EPIDURAL | Status: DC | PRN
  Administered 2019-03-11: 12 mL/h via EPIDURAL
  Filled 2019-03-10 (×2): qty 250

## 2019-03-10 MED ORDER — TERBUTALINE SULFATE 1 MG/ML IJ SOLN
0.2500 mg | Freq: Once | INTRAMUSCULAR | Status: AC | PRN
  Administered 2019-03-10: 16:00:00 0.25 mg via SUBCUTANEOUS

## 2019-03-10 MED ORDER — PHENYLEPHRINE 40 MCG/ML (10ML) SYRINGE FOR IV PUSH (FOR BLOOD PRESSURE SUPPORT)
80.0000 ug | PREFILLED_SYRINGE | INTRAVENOUS | Status: DC | PRN
  Filled 2019-03-10: qty 10

## 2019-03-10 MED ORDER — POTASSIUM CHLORIDE CRYS ER 20 MEQ PO TBCR
30.0000 meq | EXTENDED_RELEASE_TABLET | Freq: Two times a day (BID) | ORAL | Status: DC
  Filled 2019-03-10 (×4): qty 1

## 2019-03-10 MED ORDER — LIDOCAINE HCL (PF) 1 % IJ SOLN
INTRAMUSCULAR | Status: DC | PRN
  Administered 2019-03-10 (×2): 4 mL via EPIDURAL

## 2019-03-10 MED ORDER — SODIUM CHLORIDE 0.9 % IV SOLN
Freq: Once | INTRAVENOUS | Status: AC
  Administered 2019-03-10: 21:00:00 via INTRAVENOUS

## 2019-03-10 NOTE — Progress Notes (Signed)
LABOR PROGRESS NOTE  Zoe Armstrong is a 28 y.o. G1P0000 at [redacted]w[redacted]d  admitted for PDIOL and [place]..  Subjective: FB out Epidural in, feeling more comfortable  Objective: BP 116/76   Pulse 89   Temp (!) 97.4 F (36.3 C) (Oral)   Resp 18   Ht 5\' 3"  (1.6 m)   Wt 84.8 kg   LMP 05/29/2018   SpO2 99%   BMI 33.13 kg/m  or  Vitals:   03/10/19 1300 03/10/19 1305 03/10/19 1310 03/10/19 1315  BP: (!) 118/91 119/76 119/77 116/76  Pulse: 82 84 82 89  Resp:   18   Temp:  (!) 97.4 F (36.3 C)    TempSrc:  Oral    SpO2: 100% 99% 98% 99%  Weight:      Height:         Dilation: 3.5 Effacement (%): 80 Station: -3, well engaged Presentation: Vertex Exam by:: Dione Plover, MD  FHT: baseline rate 135, moderate varibility, +acel, -decel Toco: q2-3 min  Labs: Lab Results  Component Value Date   WBC 11.1 (H) 03/09/2019   HGB 9.6 (L) 03/09/2019   HCT 30.2 (L) 03/09/2019   MCV 84.6 03/09/2019   PLT 213 03/09/2019    Patient Active Problem List   Diagnosis Date Noted  . Pyelonephritis affecting pregnancy in third trimester 03/09/2019  . Supervision of normal first pregnancy 08/11/2018  . Recurrent UTI 07/19/2018  . Anxiety 07/25/2014    Assessment / Plan: 28 y.o. G1P0000 at [redacted]w[redacted]d here for PD IOL and pyelo.  Labor: FB out, 1-->3.5 cm with good effacement. Will augment with pitocin as able. On my exam bag actually feels intact, AROM as an option as well if no progress over next four hours.  Fetal Wellbeing:  Cat I Pain Control:  Epidural Anticipated MOD:  SVD Pyelo: VS stable without signs of sepsis, afebrile since 0503. Repeat lactate downtrending 2.0-->1.0, and previous hypokalemia also resolved. Cont ceftriaxone q24h, next due at [place], MD/MPH OB Fellow  03/10/2019, 1:23 PM

## 2019-03-10 NOTE — Progress Notes (Signed)
Concern for fetal deceleration vs. Poor tracing quality (tracing maternal). Fetal heart rate tracing initially was tachycardic, and interpretation of tracing appeared to have lates followed by brief period of bradycardia around 105 heart rate closely related to maternal heart rate. Mallie Snooks, CNM called to patients room to evaluate FHR. Dr. Elly Modena notified to review strip via phone, and strip also reviewed by Audria Nine RN. Will continue to monitor patient and evaluate FHR.

## 2019-03-10 NOTE — Progress Notes (Signed)
LABOR PROGRESS NOTE  Zoe Armstrong is a 28 y.o. G1P0000 at [redacted]w[redacted]d  admitted for PD IOL and pyelo.  Subjective: Still feeling unwell Feels like she has a fever Starting feeling contractions again  Objective: BP 110/68   Pulse 98   Temp (!) 101.8 F (38.8 C) (Oral)   Resp 20   Ht 5\' 3"  (1.6 m)   Wt 84.8 kg   LMP 05/29/2018   SpO2 97%   BMI 33.13 kg/m  or  Vitals:   03/10/19 1625 03/10/19 1630 03/10/19 1635 03/10/19 1640  BP:  110/68    Pulse:  98    Resp:  20    Temp:      TempSrc:      SpO2: 98% 99% 98% 97%  Weight:      Height:         Dilation: 4.5 Effacement (%): 90 Station: -3, -2 Presentation: Vertex Exam by:: Dr. Dione Plover MD FHT: baseline rate 160, minimal varibility, -acel, +late decel Toco: q3-4 min  Labs: Lab Results  Component Value Date   WBC 11.1 (H) 03/09/2019   HGB 9.6 (L) 03/09/2019   HCT 30.2 (L) 03/09/2019   MCV 84.6 03/09/2019   PLT 213 03/09/2019    Patient Active Problem List   Diagnosis Date Noted  . Pyelonephritis affecting pregnancy in third trimester 03/09/2019  . Supervision of normal first pregnancy 08/11/2018  . Recurrent UTI 07/19/2018  . Anxiety 07/25/2014    Assessment / Plan: 28 y.o. G1P0000 at [redacted]w[redacted]d here for PD IOL and pyelo.  Labor: Again developed strong contractions which she was feeling and prolonged deep decelerations necessitating a second dose of terbutaline. Contractions and decelerations now improving. Patient checked, no discernible change in exam from one hour prior, still has bulging bag. Has since received ampicillin, gentamycin still pending from pharmacy. Will continue to try tocolysis while treating underlying condition as infant does not appear to be tolerating labor.  Fetal Wellbeing:  Cat II, minimal variability less reassuring, though after first dose of terbutaline strip was much improved. CEFM, monitor closely.  Pain Control:  Epidural in place Anticipated MOD:  SVD, CS if necessary. This option was  discussed in detail with patient including the indications of either fetal or maternal distress, but emphasized we will continue to attempt a vaginal delivery as long as safely possible.  Pyelo: Renal US still pending, ceftriaxone q24h, next due at 2030.   Augustin Coupe, MD/MPH OB Fellow  03/10/2019, 4:58 PM

## 2019-03-10 NOTE — Progress Notes (Signed)
Zoe Armstrong is a 28 y.o. G1P0000 at [redacted]w[redacted]d   Subjective: Reporting chills and recurrence of flank pain. Denies contraction pain.  Objective: BP 122/62   Pulse 95   Temp 100.2 F (37.9 C) (Axillary)   Resp 18   Ht 5\' 3"  (1.6 m)   Wt 84.8 kg   LMP 05/29/2018   SpO2 99%   BMI 33.13 kg/m  No intake/output data recorded. No intake/output data recorded.  FHT:  FHR: 145 bpm, variability: moderate,  accelerations:  Present,  decelerations:  Absent UC:   irregular, every 3-5 minutes SVE:   Dilation: 1 Exam by:: Silvano Garofano CNM  Labs: Lab Results  Component Value Date   WBC 11.1 (H) 03/09/2019   HGB 9.6 (L) 03/09/2019   HCT 30.2 (L) 03/09/2019   MCV 84.6 03/09/2019   PLT 213 03/09/2019    Assessment / Plan: --28 y.o. G1P0000 at [redacted]w[redacted]d  --Category I tracing --S/p Cytotec # 1 at 2359 --Oral temp 100.2. Give 650mg  Tylenol q 4 hours PRN for fever --Cervix now very tight 1, long and thick. Pt declined foley balloon due to discomfort with exam. Prefers to place additional Cytotec now, evaluate for foley bulb placement prior to day shift.  Darlina Rumpf, CNM 03/10/2019, 3:57 AM

## 2019-03-10 NOTE — Progress Notes (Signed)
Zoe Armstrong is a 28 y.o. G1P0000 at [redacted]w[redacted]d   Subjective: Aware of contractions, denies pain. Feeling much better than when initial seen in MAU  Objective: BP 124/72   Pulse (!) 108   Temp 97.7 F (36.5 C) (Axillary)   Resp 17   Ht 5\' 3"  (1.6 m)   Wt 84.8 kg   LMP 05/29/2018   SpO2 99%   BMI 33.13 kg/m  No intake/output data recorded. No intake/output data recorded.  FHT:  FHR: 125 bpm, variability: moderate,  accelerations:  Present,  decelerations:  Absent UC:   irregular, every 2-6 minutes SVE:   Dilation: Closed Exam by:: Maryelizabeth Kaufmann CNM  Labs: Lab Results  Component Value Date   WBC 11.1 (H) 03/09/2019   HGB 9.6 (L) 03/09/2019   HCT 30.2 (L) 03/09/2019   MCV 84.6 03/09/2019   PLT 213 03/09/2019    Assessment / Plan: -Called to room by RN with concern for fetal deceleration vs. Poor tracing quality (tracing maternal) -Tracing quality and distinction between maternal and fetal improved with repositioning - S/p Cytotec at [place], [place] 03/10/2019, 12:46 AM

## 2019-03-10 NOTE — Anesthesia Preprocedure Evaluation (Signed)
Anesthesia Evaluation  Patient identified by MRN, date of birth, ID band Patient awake    Reviewed: Allergy & Precautions, Patient's Chart, lab work & pertinent test results  Airway Mallampati: II  TM Distance: >3 FB Neck ROM: Full    Dental  (+) Teeth Intact   Pulmonary neg pulmonary ROS,    Pulmonary exam normal breath sounds clear to auscultation       Cardiovascular negative cardio ROS Normal cardiovascular exam Rhythm:Regular Rate:Normal     Neuro/Psych negative neurological ROS  negative psych ROS   GI/Hepatic Neg liver ROS, GERD  ,  Endo/Other  Obesity  Renal/GU Acute Pyelonephritis  negative genitourinary   Musculoskeletal negative musculoskeletal ROS (+)   Abdominal (+) + obese,   Peds  Hematology  (+) anemia ,   Anesthesia Other Findings   Reproductive/Obstetrics (+) Pregnancy                             Anesthesia Physical Anesthesia Plan  ASA: II  Anesthesia Plan: Epidural   Post-op Pain Management:    Induction:   PONV Risk Score and Plan:   Airway Management Planned: Natural Airway  Additional Equipment:   Intra-op Plan:   Post-operative Plan:   Informed Consent: I have reviewed the patients History and Physical, chart, labs and discussed the procedure including the risks, benefits and alternatives for the proposed anesthesia with the patient or authorized representative who has indicated his/her understanding and acceptance.       Plan Discussed with: Anesthesiologist  Anesthesia Plan Comments:         Anesthesia Quick Evaluation

## 2019-03-10 NOTE — Progress Notes (Signed)
Naomi Trampe is a 28 y.o. G1P0000 at [redacted]w[redacted]d, presenting for IOL for pyelonephritis.   Subjective: Has some light vaginal leaking, we explained that this is normal during labor. Feels apyrexial after tylenol. Reports her fevers come back after tylenol wears off.   Objective: BP 107/61   Pulse 83   Temp 99.2 F (37.3 C) (Axillary)   Resp 20   Ht 5\' 3"  (1.6 m)   Wt 84.8 kg   LMP 05/29/2018   SpO2 99%   BMI 33.13 kg/m  No intake/output data recorded. No intake/output data recorded.  FHT:  FHR: 135 bpm, variability: moderate,  accelerations:  Present,  decelerations:  Present variables UC:   regular, every 3.4.5 minutes SVE:   Dilation: 1 Effacement (%): 60 Station: -3 Exam by:: Weinhold, CNM   Labs: Lab Results  Component Value Date   WBC 11.1 (H) 03/09/2019   HGB 9.6 (L) 03/09/2019   HCT 30.2 (L) 03/09/2019   MCV 84.6 03/09/2019   PLT 213 03/09/2019    Assessment / Plan: Illa Enlow is a 28 y.o. G1P0000 at [redacted]w[redacted]d, presenting for IOL for pyelonephritis.   Labor: s/p cyto X 2, last at 04:00, s/p foley bulb at 06:30, still in place. tug at [facility] every hr. Conisder Pit and AROM.   Pyelo:  Continue Ceftriazone Q24 per pharmacy.  Continue tylenol  650 Q4PRN.  Continue LR  Close monitoring for temperature spikes.  Repeat LA K 3.4-given 51meq  KDUR, continue to monitor K  Fetal Wellbeing:  Category II close monitoring of fetal heart rate.  Pain Control:  Labor support without medications I/D:  n/a Anticipated MOD:  NSVD anticipated. Progress to c-section if maternal/fetal indication.   Lattie Haw MD PGY-1, [place] [facility]  03/10/2019, 10:02 AM

## 2019-03-10 NOTE — Progress Notes (Signed)
Order received for K-Dur oral tablet for K+ value of 3.2. Recent BMP had K+ result of 3.6. Informed Dr. Dione Plover and RN to hold dose of K-Dur at this time.

## 2019-03-10 NOTE — Progress Notes (Signed)
Zoe Armstrong is a 28 y.o. G1P0000 at [redacted]w[redacted]d admitted for induction of labor due to post dates and pyelo.  Subjective: She is feeling much better. Her fevers are under control, and her pain is well controlled with her epidural. She is feeling pressure with her contractions.  Objective: BP 109/60   Pulse 87   Temp 98.6 F (37 C) (Oral)   Resp 18   Ht 5\' 3"  (1.6 m)   Wt 84.8 kg   LMP 05/29/2018   SpO2 97%   BMI 33.13 kg/m  No intake/output data recorded.  FHT:  FHR: 135-140 bpm, variability: moderate,  accelerations:  Present,  decelerations:  Present occasional variables after contractions; There was one prolonged decel while the patient was laying on her right, which resolved with turning the patient onto her left side. UC:   regular, every 2-6 minutes  SVE:   Dilation: 8 Effacement (%): 90 Station: 0 Exam by:: Dr. Darene Lamer   Pitocin @ 4 mu/min  AROM with thick-meconium stained fluid. FSE and IUPC placed. Amnioinfusion 300 mL bolus with 174ml/hr rate.  Labs: Lab Results  Component Value Date   WBC 11.1 (H) 03/09/2019   HGB 9.6 (L) 03/09/2019   HCT 30.2 (L) 03/09/2019   MCV 84.6 03/09/2019   PLT 213 03/09/2019    Assessment / Plan: 28 yo G1P0000 at 40.5 EGA, IOL 2/2 post dates and pyelo  Labor: Making excellent cervical change. Pitocin at 4. Having pressure with her contractions. Has not needed more terbutaline since the last dose this afternoon.AROM with thick-meconium stained fluid. IUPC in place. Will start amnioinfusion, Dr. Elonda Husky made aware.  Fetal Wellbeing:  Category II. Reassuring for good variability. Occasional variables after contractions. FSE placed, continue to monitor closely. Pain Control:  Epidural Pyelo: Fevers well controlled with scheduled tylenol. Continue scheduled Rocephin Suspected chorio:  Continue Amp/Gent Anticipated MOD:  Anticipate vaginal delivery, CS as appropriate. Patient has been counseled on indications for Cesarean delivery, and again  emphasis was placed on attempting vaginal delivery as long as safely possible.  [place] Fellow, Faculty Practice 03/10/2019, 11:21 PM

## 2019-03-10 NOTE — Anesthesia Procedure Notes (Signed)
Epidural Patient location during procedure: OB Start time: 03/10/2019 12:42 PM End time: 03/10/2019 12:51 PM  Staffing Anesthesiologist: Josephine Igo, MD Performed: anesthesiologist   Preanesthetic Checklist Completed: patient identified, site marked, surgical consent, pre-op evaluation, timeout performed, IV checked, risks and benefits discussed and monitors and equipment checked  Epidural Patient position: sitting Prep: site prepped and draped and DuraPrep Patient monitoring: continuous pulse ox and blood pressure Approach: midline Location: L3-L4 Injection technique: LOR air  Needle:  Needle type: Tuohy  Needle gauge: 17 G Needle length: 9 cm and 9 Needle insertion depth: 6 cm Catheter type: closed end flexible Catheter size: 19 Gauge Catheter at skin depth: 11 cm Test dose: negative and Other  Assessment Events: blood not aspirated, injection not painful, no injection resistance, negative IV test and no paresthesia  Additional Notes Patient identified. Risks and benefits discussed including failed block, incomplete  Pain control, post dural puncture headache, nerve damage, paralysis, blood pressure Changes, nausea, vomiting, reactions to medications-both toxic and allergic and post Partum back pain. All questions were answered. Patient expressed understanding and wished to proceed. Sterile technique was used throughout procedure. Epidural site was Dressed with sterile barrier dressing. No paresthesias, signs of intravascular injection Or signs of intrathecal spread were encountered.  Patient was more comfortable after the epidural was dosed. Please see RN's note for documentation of vital signs and FHR which are stable. Reason for block:procedure for pain

## 2019-03-10 NOTE — Progress Notes (Signed)
LABOR PROGRESS NOTE  Zoe Armstrong is a 28 y.o. G1P0000 at [redacted]w[redacted]d  admitted for PD IOL and pyelo.  Subjective: Feeling unwell Very shaky  Objective: BP 132/78   Pulse (!) 107   Temp (!) 101.8 F (38.8 C) (Oral)   Resp 18   Ht 5\' 3"  (1.6 m)   Wt 84.8 kg   LMP 05/29/2018   SpO2 100%   BMI 33.13 kg/m  or  Vitals:   03/10/19 1446 03/10/19 1528 03/10/19 1530 03/10/19 1534  BP:  132/78 132/78   Pulse:  (!) 107 (!) 107   Resp:      Temp: 97.8 F (36.6 C)  (!) 101.8 F (38.8 C)   TempSrc: Axillary  Oral   SpO2:    100%  Weight:      Height:         Dilation: 4.5 Effacement (%): 90 Station: -3, -2 Presentation: Vertex Exam by:: Dr. Dione Plover  FHT: baseline rate 170, minimal varibility, -acel, +decel Toco: q4-5 min  Labs: Lab Results  Component Value Date   WBC 11.1 (H) 03/09/2019   HGB 9.6 (L) 03/09/2019   HCT 30.2 (L) 03/09/2019   MCV 84.6 03/09/2019   PLT 213 03/09/2019    Patient Active Problem List   Diagnosis Date Noted  . Pyelonephritis affecting pregnancy in third trimester 03/09/2019  . Supervision of normal first pregnancy 08/11/2018  . Recurrent UTI 07/19/2018  . Anxiety 07/25/2014    Assessment / Plan: 28 y.o. G1P0000 at [redacted]w[redacted]d here for PD IOL and pyelonephritis.  Labor: Patient developed recurrent late decels and began to lose variability prompting administration of terbutaline. Shortly after developed a fever and began to rigor again. On exam patient with some progress from 3/80/-3 to 4.5/90/-3,-2. At this point will pause induction due to both maternal and fetal intolerance, decelerations now resolving with terbutaline.  Fetal Wellbeing:  Cat II, though variability is starting to return which is reassuring Pain Control:  epidural Anticipated MOD:  SVD Pyelo/?SROM: On report this morning there was question of possible SROM. On my exam there is a clearly bulging bag, but this does not rule out a high leak. Unclear diagnostic picture as we may have  simultaneous pyelo and chorioamnionitis. Given uncertainty will treat empirically for chorio, also obtain renal US, and bolus 2L given poor UOP/fever/maternal & fetal tachycardia.   Augustin Coupe, MD/[facility] OB Fellow  03/10/2019, 3:59 PM

## 2019-03-10 NOTE — Progress Notes (Signed)
Pharmacy Antibiotic Note  Zoe Armstrong is a 28 y.o. female admitted on 03/09/2019 for postdate IOL with concern for Pyelonephritis.  Pharmacy has been consulted for Gentamicin dosing for chorioamnionitis. Per note, plan for renal ultrasound to assess for abscess.  Plan: Gentamicin 330mg  IV q24h (5mg /kg) Will continue to follow for further workup  Height: 5\' 3"  (160 cm) Weight: 187 lb (84.8 kg) IBW/kg (Calculated) : 52.4  ABW: 65.4kg  Temp (24hrs), Avg:99.1 F (37.3 C), Min:97.4 F (36.3 C), Max:101.8 F (38.8 C)  Recent Labs  Lab 03/08/19 2331 03/09/19 2029 03/10/19 0855  WBC 11.4* 11.1*  --   CREATININE 0.59 0.64 0.67  LATICACIDVEN  --  2.0* 1.0    Estimated Creatinine Clearance: 107.1 mL/min (by C-G formula based on SCr of 0.67 mg/dL).    No Known Allergies  Antimicrobials this admission: Ceftriaxone 2 gram IV q24h  9/10 >> Amp 2 gram IV q6h  9/11>>   Thank you for allowing pharmacy to be a part of this patient's care.  Vernie Ammons 03/10/2019 4:09 PM

## 2019-03-10 NOTE — Progress Notes (Signed)
   Difficult situation due to IOL in setting of pyelo and ?SROM this morning during IOL. Rpt LA normal, but pt did re-spike a temp. Amnisure of ? Utility at this point due to multiple cx exams.  Pt s/p terbutaline and never on pitocin.  Plan: Start amp and gent, with tylenol for potential chorio, although could be pyelo that's refractory Give another 1L bolus Continue rocephin q24h Will obtain renal u/s to check for abscess Hold off on augmentation further until assessment and fetal status improves.   Durene Romans MD Attending Center for [employer] (Faculty Practice) 03/10/2019 Time: [phone]

## 2019-03-10 NOTE — Progress Notes (Signed)
Zoe Armstrong is a 28 y.o. G1P0000 at [redacted]w[redacted]d   Subjective: Patient feeling unwell and anxious again, though still better than when she was admitted.  Objective: BP (!) 104/44   Pulse (!) 102   Temp (!) 100.8 F (38.2 C) (Axillary)   Resp 18   Ht 5\' 3"  (1.6 m)   Wt 84.8 kg   LMP 05/29/2018   SpO2 99%   BMI 33.13 kg/m  No intake/output data recorded. No intake/output data recorded.  FHT:  FHR: 155 bpm, variability: moderate,  accelerations:  Present,  decelerations:  Absent UC:   irregular, every 2-5 minutes SVE:   Dilation: 1 Effacement (%): 60 Station: -3 Exam by:: [employer], CNM   Labs: Lab Results  Component Value Date   WBC 11.1 (H) 03/09/2019   HGB 9.6 (L) 03/09/2019   HCT 30.2 (L) 03/09/2019   MCV 84.6 03/09/2019   PLT 213 03/09/2019   Assessment / Plan: --28 y.o. G1P0000  --Category I tracing --Questionable SROM at 0500 per RN --Recurrence of fever, oral temp 100.8 at 0500 --S/p Cytotec 2 of 2 at 0400 --Foley bulb placed at 0630, tolerated very well by patient --Plan for meal break at 4 hours from Cyotec (around 0800)  Darlina Rumpf , CNM 03/10/2019, 6:44 AM

## 2019-03-10 NOTE — Progress Notes (Signed)
10 Instruments 5 9*9 5 4*18 2 Injectables  

## 2019-03-11 ENCOUNTER — Encounter (HOSPITAL_COMMUNITY): Payer: Self-pay

## 2019-03-11 DIAGNOSIS — O480 Post-term pregnancy: Secondary | ICD-10-CM

## 2019-03-11 DIAGNOSIS — Z3A40 40 weeks gestation of pregnancy: Secondary | ICD-10-CM

## 2019-03-11 MED ORDER — BENZOCAINE-MENTHOL 20-0.5 % EX AERO
1.0000 "application " | INHALATION_SPRAY | CUTANEOUS | Status: DC | PRN
  Administered 2019-03-11 – 2019-03-13 (×2): 1 via TOPICAL
  Filled 2019-03-11 (×2): qty 56
  Filled 2019-03-11: qty 112

## 2019-03-11 MED ORDER — SENNOSIDES-DOCUSATE SODIUM 8.6-50 MG PO TABS
2.0000 | ORAL_TABLET | ORAL | Status: DC
  Administered 2019-03-12: 2 via ORAL
  Filled 2019-03-11: qty 2

## 2019-03-11 MED ORDER — ZOLPIDEM TARTRATE 5 MG PO TABS: 5.0000 mg | ORAL_TABLET | Freq: Every evening | ORAL | Status: DC | PRN

## 2019-03-11 MED ORDER — ONDANSETRON HCL 4 MG PO TABS: 4.0000 mg | ORAL_TABLET | ORAL | Status: DC | PRN

## 2019-03-11 MED ORDER — BUPIVACAINE HCL (PF) 0.25 % IJ SOLN
INTRAMUSCULAR | Status: DC | PRN
  Administered 2019-03-11 (×2): 5 mL via EPIDURAL

## 2019-03-11 MED ORDER — FENTANYL CITRATE (PF) 100 MCG/2ML IJ SOLN
INTRAMUSCULAR | Status: AC
  Filled 2019-03-11: qty 4

## 2019-03-11 MED ORDER — DIBUCAINE (PERIANAL) 1 % EX OINT: 1.0000 "application " | TOPICAL_OINTMENT | CUTANEOUS | Status: DC | PRN

## 2019-03-11 MED ORDER — SIMETHICONE 80 MG PO CHEW: 80.0000 mg | CHEWABLE_TABLET | ORAL | Status: DC | PRN

## 2019-03-11 MED ORDER — TETANUS-DIPHTH-ACELL PERTUSSIS 5-2.5-18.5 LF-MCG/0.5 IM SUSP: 0.5000 mL | Freq: Once | INTRAMUSCULAR | Status: DC

## 2019-03-11 MED ORDER — FENTANYL CITRATE (PF) 100 MCG/2ML IJ SOLN
50.0000 ug | Freq: Once | INTRAMUSCULAR | Status: AC
  Administered 2019-03-11: 50 ug via INTRAVENOUS

## 2019-03-11 MED ORDER — IBUPROFEN 600 MG PO TABS
600.0000 mg | ORAL_TABLET | Freq: Four times a day (QID) | ORAL | Status: DC
  Administered 2019-03-11 – 2019-03-13 (×8): 600 mg via ORAL
  Filled 2019-03-11 (×8): qty 1

## 2019-03-11 MED ORDER — DIPHENHYDRAMINE HCL 25 MG PO CAPS: 25.0000 mg | ORAL_CAPSULE | Freq: Four times a day (QID) | ORAL | Status: DC | PRN

## 2019-03-11 MED ORDER — COCONUT OIL OIL: 1.0000 "application " | TOPICAL_OIL | Status: DC | PRN

## 2019-03-11 MED ORDER — FENTANYL CITRATE (PF) 100 MCG/2ML IJ SOLN
INTRAMUSCULAR | Status: AC
  Filled 2019-03-11: qty 2

## 2019-03-11 MED ORDER — ONDANSETRON HCL 4 MG/2ML IJ SOLN: 4.0000 mg | INTRAMUSCULAR | Status: DC | PRN

## 2019-03-11 MED ORDER — INFLUENZA VAC SPLIT QUAD 0.5 ML IM SUSY
0.5000 mL | PREFILLED_SYRINGE | INTRAMUSCULAR | Status: DC
  Filled 2019-03-11: qty 0.5

## 2019-03-11 MED ORDER — WITCH HAZEL-GLYCERIN EX PADS
1.0000 "application " | MEDICATED_PAD | CUTANEOUS | Status: DC | PRN
  Administered 2019-03-13: 1 via TOPICAL

## 2019-03-11 MED ORDER — ACETAMINOPHEN 325 MG PO TABS
650.0000 mg | ORAL_TABLET | ORAL | Status: DC | PRN
  Administered 2019-03-13: 650 mg via ORAL
  Filled 2019-03-11: qty 2

## 2019-03-11 MED ORDER — POLYETHYLENE GLYCOL 3350 17 G PO PACK: 17.0000 g | PACK | Freq: Every day | ORAL | Status: DC

## 2019-03-11 MED ORDER — FENTANYL CITRATE (PF) 100 MCG/2ML IJ SOLN: 200.0000 ug | Freq: Once | INTRAMUSCULAR | Status: DC

## 2019-03-11 MED ORDER — PRENATAL MULTIVITAMIN CH
1.0000 | ORAL_TABLET | Freq: Every day | ORAL | Status: DC
  Administered 2019-03-12 – 2019-03-13 (×2): 1 via ORAL
  Filled 2019-03-11 (×2): qty 1

## 2019-03-11 MED ORDER — FENTANYL CITRATE (PF) 100 MCG/2ML IJ SOLN
100.0000 ug | Freq: Once | INTRAMUSCULAR | Status: AC
  Administered 2019-03-11: 11:00:00 100 ug via EPIDURAL

## 2019-03-11 NOTE — Progress Notes (Signed)
MOB was referred for history of anxiety.  * Referral screened out by Clinical Social Worker because none of the following criteria appear to apply:  ~ History of anxiety during this pregnancy, or of post-partum depression following prior delivery. ~ Diagnosis of anxiety within last 3 years. MOB's anxiety appears to date back to 2016 and no concerns were noted in [facility] records.  OR * MOB's symptoms currently being treated with medication and/or therapy.  Please contact the Clinical Social Worker if needs arise, by MOB request, or if MOB scores greater than 9/yes to question 10 on Edinburgh Postpartum Depression Screen.  Siri Buege, LCSWA  [facility] [phone]  

## 2019-03-11 NOTE — Progress Notes (Signed)
Faculty Note  Called to room for evaluation of repair at request of CNM. Patient s/p SVD after protracted labor course. Comfortable with epidural. Vulva, labia, rectum grossly swollen but no significant bleeding. Careful inspection of the laceration shows a 4th degree laceration with about 1 cm of tear into the rectal mucosa. Bilateral shallow sulcal tears. The mucosa was repaired with 2-0 Vicryl and digital exam showed no suture penetrating the mucosa. The rectal muscularis was repaired with 2-0 Vicryl in the same fashion. The external anal sphincter was grasped with 2 Alis clamps and brought together with interrupted sutures of 2-0 Vicryl. The perineal body was built up with interrupted sutures of 2-0 Vicryl. The sulcal lacerations with repaired in usual running fashion with 2-0 vicryl and the skin was approximated with 2-0 vicryl. Digital rectal exam showed good tone of the anal sphincter and no suture present in the rectal mucosa. 5 mL lidocaine 1% given for analgesia. Instructions given, patient will need antibiotics post partum as well as a bowel regimen. Counts for laps, instruments, sharps correct at the end of the procedure.    Feliz Beam, M.D. Attending Center for [employer] farm manager)

## 2019-03-11 NOTE — Progress Notes (Signed)
Labor Progress Note Zoe Armstrong is a 28 y.o. G1P0000 at [redacted]w[redacted]d presented for IOL for postdates and pyelo  S:  Patient able to sleep since redosing epidural  O:  BP 111/76   Pulse 71   Temp 98.6 F (37 C) (Oral)   Resp 16   Ht 5\' 3"  (1.6 m)   Wt 84.8 kg   LMP 05/29/2018   SpO2 98%   BMI 33.13 kg/m   Fetal Tracing:  Baseline: 120 Variability: moderate Accels: 15x15 Decels: variable  Toco: 2-4   CVE: Dilation: 10 Dilation Complete Date: 03/11/19 Dilation Complete Time: 1223 Effacement (%): 100 Station: Plus 2 Presentation: Vertex Exam by:: Haynes Bast, CNM   A&P: 28 y.o. G1P0000 [redacted]w[redacted]d postdates IOL, pyelo #Labor: Progressing well. Able to push past persistent anterior lip. Will have her continue to push. #Pain: epidural  #FWB: Cat 2 with pushing #GBS negative  Wende Mott, CNM 12:30 PM

## 2019-03-11 NOTE — Lactation Note (Addendum)
This note was copied from a baby's chart. Lactation Consultation Note  Patient Name: Zoe Armstrong DVVOH'Y Date: 03/11/2019 Reason for consult: Initial assessment;1st time breastfeeding P1, 5 hour female infant. Per mom, infant attempted to latch in L&D. Mom has Medela DEBP at home. Mom attempted to latch infant on right breast using the football hold, infant only hold breast in mouth at this time but not suckle at breast. Parents have been dong STS. Mom taught back hand expression and infant was given 8 ml of colostrum on spoon. Mom will continue to work towards latching infant on breast.  Mom knows if infant is not latching to hand express and give back volume on spoon. Mom will call Nurse or [place] if she has questions, concerns or need assistance with latching infant on breast.  Parents will continue to do as much STS as possible. Mom knows to breastfeed infant according hunger cues, 8 to 12 times within 24 hours and on demand. Reviewed Baby & Me book's Breastfeeding Basics.  Mom made aware of O/P services, breastfeeding support groups, community resources, and our phone # for post-discharge questions.    Maternal Data Formula Feeding for Exclusion: No Has patient been taught Hand Expression?: Yes(Infant given 8 ml of colostrum by spoon.) Does the patient have breastfeeding experience prior to this delivery?: No  Feeding Feeding Type: Breast Fed  LATCH Score Latch: Too sleepy or reluctant, no latch achieved, no sucking elicited.  Audible Swallowing: None  Type of Nipple: Everted at rest and after stimulation  Comfort (Breast/Nipple): Soft / non-tender  Hold (Positioning): Assistance needed to correctly position infant at breast and maintain latch.  LATCH Score: 5  Interventions Interventions: Breast feeding basics reviewed;Breast compression;Assisted with latch;Adjust position;Skin to skin;Support pillows;Breast massage;Position options;Hand express  Lactation Tools  Discussed/Used WIC Program: No   Consult Status Consult Status: Follow-up Date: 03/12/19 Follow-up type: In-patient    Vicente Serene 03/11/2019, 7:43 PM

## 2019-03-11 NOTE — Discharge Summary (Addendum)
Postpartum Discharge Summary      Patient Name: Zoe Armstrong DOB: 13-Feb-1991 MRN: [id number]  Date of admission: 03/09/2019 Delivering Provider: Wende Mott   Date of discharge: 03/13/2019  Admitting diagnosis: UTI EVAL  Intrauterine pregnancy: [redacted]w[redacted]d    Secondary diagnosis:  Active Problems:   Pyelonephritis affecting pregnancy in third trimester   Fourth degree perineal laceration  Additional problems: 4th degree laceration     Discharge diagnosis: Term Pregnancy Delivered                                                                                                Post partum procedures:n/a  Augmentation: AROM, Pitocin, Cytotec and Foley Balloon  Complications: Intrauterine Inflammation or infection (Chorioamniotis)  Hospital course:  Induction of Labor With Vaginal Delivery   28y.o. yo G1P0000 at 48w6das admitted to the hospital 03/09/2019 for induction of labor.  Indication for induction: Postdates and and pyelonephritis.  Patient had an uncomplicated labor course as follows: Membrane Rupture Time/Date: 11:09 PM ,03/10/2019   Intrapartum Procedures: Episiotomy: None [1]                                         Lacerations:  4th degree [5];Perineal [11]  Patient had delivery of a Viable infant.  Information for the patient's newborn:  Zoe Armstrong, Zoe Armstrong[[id number]]Delivery Method: Vaginal, Spontaneous(Filed from Delivery Summary)    03/11/2019  Details of delivery can be found in separate delivery note.  Patient had a routine postpartum course. Patient is discharged home 03/13/19. Delivery time: 1:43 PM    Magnesium Sulfate received: No BMZ received: No Rhophylac:N/A MMR:No Transfusion:No  Physical exam  Vitals:   03/12/19 0539 03/12/19 1517 03/12/19 2128 03/13/19 0520  BP: 118/76 107/77 107/78 100/67  Pulse: 64 73 75 80  Resp: 18 18 18 18   Temp: 97.9 F (36.6 C) 97.8 F (36.6 C) 97.8 F (36.6 C) 97.9 F (36.6 C)  TempSrc: Oral Oral Oral Oral   SpO2: 100%  98% 100%  Weight:      Height:       General: alert, cooperative and no distress Lochia: appropriate Uterine Fundus: firm Incision: N/A DVT Evaluation: No evidence of DVT seen on physical exam. No significant calf/ankle edema. Labs: Lab Results  Component Value Date   WBC 8.9 03/13/2019   HGB 7.7 (L) 03/13/2019   HCT 24.4 (L) 03/13/2019   MCV 86.5 03/13/2019   PLT 212 03/13/2019   CMP Latest Ref Rng & Units 03/13/2019  Glucose 70 - 99 mg/dL 75  BUN 6 - 20 mg/dL 9  Creatinine 0.44 - 1.00 mg/dL 0.89  Sodium 135 - 145 mmol/L 139  Potassium 3.5 - 5.1 mmol/L 3.5  Chloride 98 - 111 mmol/L 112(H)  CO2 22 - 32 mmol/L 20(L)  Calcium 8.9 - 10.3 mg/dL 7.7(L)  Total Protein 6.5 - 8.1 g/dL -  Total Bilirubin 0.3 - 1.2 mg/dL -  Alkaline Phos 38 - 126 U/L -  AST 15 -  41 U/L -  ALT 0 - 44 U/L -    Discharge instruction: per After Visit Summary and "Baby and Me Booklet".  After visit meds:  Allergies as of 03/13/2019   No Known Allergies     Medication List    TAKE these medications   cephALEXin 500 MG capsule Commonly known as: KEFLEX Take 1 capsule (500 mg total) by mouth 4 (four) times daily for 7 days.   ferrous sulfate 325 (65 FE) MG tablet Please take every other day, continue good bowel regimen   ibuprofen 600 MG tablet Commonly known as: ADVIL Take 1 tablet (600 mg total) by mouth every 6 (six) hours.   oxyCODONE-acetaminophen 5-325 MG tablet Commonly known as: PERCOCET/ROXICET Take 2 tablets by mouth every 4 (four) hours as needed (for pain scale equal to or greater than 7.).   polyethylene glycol 17 g packet Commonly known as: MIRALAX / GLYCOLAX Take 17 g by mouth daily.   senna-docusate 8.6-50 MG tablet Commonly known as: Senokot-S Take 2 tablets by mouth daily as needed for mild constipation.       Diet: routine diet  Activity: Advance as tolerated. Pelvic rest for 6 weeks.   Outpatient follow up:4 weeks Follow up Appt:No future  appointments. Follow up Visit: [place] for [facility] [address] Follow up in 1 week(s).   Specialty: Obstetrics and Gynecology Why: laceration check  Contact information: [address], [place] [id number] [place] [id number] [phone]          Please schedule this patient for PP visit in: 1 week for 4th degree lac exam with hgb check, 4 weeks for PP exam  Low risk pregnancy complicated by: pyelonephritis at delivery. Patient will need to continue cephalexin for 1 week.  Delivery mode:  SVD Anticipated Birth Control:  POPs PP Procedures needed: vaginal exam  Schedule Integrated BH visit: yes Provider: MD   Newborn Data: Live born female  Birth Weight:   APGAR: 26, 8  Newborn Delivery   Birth date/time: 03/11/2019 13:43:00 Delivery type: Vaginal, Spontaneous      Baby Feeding: Breast Disposition:home with mother   Nikki Dom Driver, MD 17/61/60 7:37 AM  I confirm that I have verified the information documented in the resident's note and that I have also personally reperformed the history, physical exam and all medical decision making activities of this service and have verified that all service and findings are accurately documented in this student's note.     Wende Mott, CNM

## 2019-03-11 NOTE — Progress Notes (Signed)
Zoe Armstrong is a 28 y.o. G1P0000 at [redacted]w[redacted]d admitted for induction of labor due to post dates and pyelo.  Subjective: Very uncomfortable with right sided pain radiating down from her right CVA into her groin. Feeling pressure with contractions.  Objective: BP 110/72   Pulse 84   Temp 99.2 F (37.3 C) (Oral)   Resp 18   Ht 5\' 3"  (1.6 m)   Wt 84.8 kg   LMP 05/29/2018   SpO2 98%   BMI 33.13 kg/m  No intake/output data recorded.  FHT:  FHR: 120 bpm, variability: moderate,  accelerations:  Present,  decelerations:  Absent UC:   regular, every 3-5 minutes  SVE:   Dilation: Lip/rim Effacement (%): 100 Station: Plus 1 Exam by:: Dr. Darene Lamer  Pitocin @ 6 mu/min  Labs: Lab Results  Component Value Date   WBC 11.1 (H) 03/09/2019   HGB 9.6 (L) 03/09/2019   HCT 30.2 (L) 03/09/2019   MCV 84.6 03/09/2019   PLT 213 03/09/2019    Assessment / Plan: 28 yo G1P0000 at 40.6 EGA, IOL 2/2 post dates and pyelo  Labor: Pitocin at 6. Having increased pressure with her contractions.  Fetal Wellbeing:Category II. Reassuring for good variability. Occasional variables after contractions. FSE in place Pain Control:Epidural Pyelo:Fevers well controlled with scheduled tylenol. Continue scheduled Rocephin Suspected chorio:Continue Amp/Gent Anticipated UDJ:[id number] vaginal delivery, CS as appropriate. Patient has been counseled on indications for Cesarean delivery, and again emphasis was placed on attempting vaginal delivery as long as safely possible.  [place] Fellow, Faculty Practice 03/11/2019, 7:43 AM

## 2019-03-11 NOTE — Progress Notes (Signed)
Labor Progress Note Zoe Armstrong is a 28 y.o. G1P0000 at [redacted]w[redacted]d presented for IOL for postdates and pyelo  S:  Patient still reporting worsened side pain and rectal pressure  O:  BP 123/68   Pulse 92   Temp 98.6 F (37 C) (Axillary) Comment (Src): Pt was eating italian ice  Resp 18   Ht 5\' 3"  (1.6 m)   Wt 84.8 kg   LMP 05/29/2018   SpO2 98%   BMI 33.13 kg/m   Fetal Tracing:  Baseline: 120 Variability: moderate Accels: 15x15 Decels: none  Toco: 2-4   CVE: Dilation: Lip/rim Effacement (%): 100 Station: 0, Plus 1 Presentation: Vertex Exam by:: Len Blalock, CNM   A&P: 28 y.o. G1P0000 [redacted]w[redacted]d postdates IOL, pyelo #Labor: Cervix unchanged but patient has been resistant to position changes due to pain. Patient still desires to avoid c/s. Discussed importance of position changes to help with fetal rotation in pelvis. Patient placed in high fowlers position with feet lowered. Patient reports pain is improved. Will recheck in 1 hour and titrate pit as able #Pain: epidural, consider anesthesia consult if pain still unmanageable #FWB: Cat 1 #GBS negative  Wende Mott, CNM 9:13 AM

## 2019-03-11 NOTE — Progress Notes (Signed)
Zoe Armstrong is a 28 y.o. G1P0000 at [redacted]w[redacted]d admitted for induction of labor due to post dates and pyelo.  Subjective: Feeling more pressure with contractions. Fevers remain under control. Pain adequately controlled with epidural.  Objective: BP (!) 114/53   Pulse 74   Temp 99.2 F (37.3 C) (Oral)   Resp 20   Ht 5\' 3"  (1.6 m)   Wt 84.8 kg   LMP 05/29/2018   SpO2 98%   BMI 33.13 kg/m  Total I/O In: -  Out: 400 [Urine:400]  FHT:  FHR: 125-130 bpm, variability: moderate,  accelerations:  Present,  decelerations:  Present occasional variables after contractions UC:   regular, every 2-5 minutes  SVE:   Dilation: Lip/rim Effacement (%): 100 Station: Plus 1 Exam by:: Dr Darene Lamer  Pitocin @ 4 mu/min  Labs: Lab Results  Component Value Date   WBC 11.1 (H) 03/09/2019   HGB 9.6 (L) 03/09/2019   HCT 30.2 (L) 03/09/2019   MCV 84.6 03/09/2019   PLT 213 03/09/2019    Assessment / Plan: 28 yo G1P0000 at 40.6 EGA, IOL 2/2 post dates and pyelo  Labor: Making excellent cervical change. Pitocin at 4. Having increased pressure with her contractions. Tried pushing with a few contractions, but anterior lip was unable to be reduced. As fetal status is reassuring, recommend laboring down. Fetal Wellbeing:  Category II. Reassuring for good variability. Occasional variables after contractions. Continue to monitor closely. Pain Control:  Epidural Pyelo: Fevers well controlled with scheduled tylenol. Continue scheduled Rocephin Suspected chorio:  Continue Amp/Gent Anticipated MOD:  Anticipate vaginal delivery, CS as appropriate. Patient has been counseled on indications for Cesarean delivery, and again emphasis was placed on attempting vaginal delivery as long as safely possible.  Merilyn Baba DO OB Fellow, Faculty Practice 03/11/2019, 3:05 AM

## 2019-03-12 ENCOUNTER — Inpatient Hospital Stay (HOSPITAL_COMMUNITY): Payer: BC Managed Care – PPO

## 2019-03-12 LAB — CBC WITH DIFFERENTIAL/PLATELET
Abs Immature Granulocytes: 0.05 10*3/uL (ref 0.00–0.07)
Basophils Absolute: 0 10*3/uL (ref 0.0–0.1)
Basophils Relative: 0 %
Eosinophils Absolute: 0.1 10*3/uL (ref 0.0–0.5)
Eosinophils Relative: 1 %
HCT: 27.3 % — ABNORMAL LOW (ref 36.0–46.0)
Hemoglobin: 8.7 g/dL — ABNORMAL LOW (ref 12.0–15.0)
Immature Granulocytes: 0 %
Lymphocytes Relative: 10 %
Lymphs Abs: 1.3 10*3/uL (ref 0.7–4.0)
MCH: 27.2 pg (ref 26.0–34.0)
MCHC: 31.9 g/dL (ref 30.0–36.0)
MCV: 85.3 fL (ref 80.0–100.0)
Monocytes Absolute: 0.6 10*3/uL (ref 0.1–1.0)
Monocytes Relative: 5 %
Neutro Abs: 10.8 10*3/uL — ABNORMAL HIGH (ref 1.7–7.7)
Neutrophils Relative %: 84 %
Platelets: 203 10*3/uL (ref 150–400)
RBC: 3.2 MIL/uL — ABNORMAL LOW (ref 3.87–5.11)
RDW: 15.1 % (ref 11.5–15.5)
WBC: 12.9 10*3/uL — ABNORMAL HIGH (ref 4.0–10.5)
nRBC: 0 % (ref 0.0–0.2)

## 2019-03-12 LAB — BASIC METABOLIC PANEL
Anion gap: 8 (ref 5–15)
BUN: 11 mg/dL (ref 6–20)
CO2: 19 mmol/L — ABNORMAL LOW (ref 22–32)
Calcium: 7.6 mg/dL — ABNORMAL LOW (ref 8.9–10.3)
Chloride: 109 mmol/L (ref 98–111)
Creatinine, Ser: 1.12 mg/dL — ABNORMAL HIGH (ref 0.44–1.00)
GFR calc Af Amer: >60 mL/min (ref >60)
GFR calc non Af Amer: >60 mL/min (ref >60)
Glucose, Bld: 74 mg/dL (ref 70–99)
Potassium: 3.4 mmol/L — ABNORMAL LOW (ref 3.5–5.1)
Sodium: 136 mmol/L (ref 135–145)

## 2019-03-12 LAB — CULTURE, OB URINE: Culture: 80000 — AB

## 2019-03-12 MED ORDER — FUROSEMIDE 20 MG PO TABS
20.0000 mg | ORAL_TABLET | Freq: Every day | ORAL | Status: AC
  Administered 2019-03-12 – 2019-03-13 (×2): 20 mg via ORAL
  Filled 2019-03-12 (×3): qty 1

## 2019-03-12 MED ORDER — POTASSIUM CHLORIDE CRYS ER 20 MEQ PO TBCR
20.0000 meq | EXTENDED_RELEASE_TABLET | Freq: Every day | ORAL | Status: DC
  Administered 2019-03-12 – 2019-03-13 (×2): 20 meq via ORAL
  Filled 2019-03-12 (×3): qty 1

## 2019-03-12 MED ORDER — LOPERAMIDE HCL 2 MG PO CAPS
2.0000 mg | ORAL_CAPSULE | Freq: Once | ORAL | Status: AC
  Administered 2019-03-12: 2 mg via ORAL
  Filled 2019-03-12: qty 1

## 2019-03-12 NOTE — Lactation Note (Signed)
This note was copied from a baby's chart. Lactation Consultation Note  Patient Name: Zoe Armstrong SHFWY'O Date: 03/12/2019 Reason for consult: Follow-up assessment   Baby 16 hours old and having difficulty latching.  RN request for assistance. Noted baby has thick labial frenulum that is tight and blanches when flanged and short mid anterior lingual frenulum with indention in tongue. Baby is tongue thrusting and still developing the ability to suck. Attempted in cross cradle, football and with and without #24 NS. Baby was unable at this time to sustain latch.  Parents have been spoon feeding. Encouraged them to continue. Set up DEBP.   Encouraged mother to pump q 3 hours until baby is latching. Mother was in pain during consult and had not slept so suggested later follow up for practice with latching and more detailed review of pumping.  Mother has Medela DEBP at home.    Maternal Data Has patient been taught Hand Expression?: Yes  Feeding    LATCH Score                   Interventions Interventions: Assisted with latch;Breast feeding basics reviewed;Skin to skin;Hand express;Pre-pump if needed;DEBP;Hand pump  Lactation Tools Discussed/Used     Consult Status Consult Status: Follow-up Date: 03/13/19 Follow-up type: In-patient    Vivianne Master [facility] 03/12/2019, 10:55 AM

## 2019-03-12 NOTE — Anesthesia Postprocedure Evaluation (Signed)
Anesthesia Post Note  Patient: Zoe Armstrong  Procedure(s) Performed: AN AD HOC LABOR EPIDURAL     Patient location during evaluation: Mother Baby Anesthesia Type: Epidural Level of consciousness: awake and alert and oriented Pain management: satisfactory to patient Vital Signs Assessment: post-procedure vital signs reviewed and stable Respiratory status: spontaneous breathing and nonlabored ventilation Cardiovascular status: stable Postop Assessment: no headache, no backache, no signs of nausea or vomiting, adequate PO intake, patient able to bend at knees and able to ambulate (patient up walking) Anesthetic complications: no    Last Vitals:  Vitals:   03/12/19 0315 03/12/19 0539  BP: 103/76 118/76  Pulse: 81 64  Resp: 18 18  Temp: 36.7 C 36.6 C  SpO2: 100% 100%    Last Pain:  Vitals:   03/12/19 0539  TempSrc: Oral  PainSc: 3    Pain Goal:                   Willa Rough

## 2019-03-12 NOTE — Progress Notes (Signed)
Post Partum Day 1  Subjective:  Pt is s/p SVD complicated by pyelonephritis, chorioamnionitis and 4th degree laceration. Pt reports she is extremely tired as she has not "slept in several days." Pt reports she does not feel ill any longer and does not feel like she has a fever, but reports she is feeling soreness from her laceration but the pain is well-controlled by pain medication. Pt complains of extreme bilateral leg swelling, that she reports makes it difficult for her to ambulate. Pt reports she is able to ambulate, but she is not able to do so without assistance at this time. Pt also reports one episode of diarrhea this morning and an order was placed for Immodium by the midwife. Pt had just been given this dose prior to provider entering the room. Pt reports she is able to urinate without difficulty and is able to eat and drink. Pt reports she is attempting to breastfeed, but reports the baby is having difficulty latching d/t a problem with the frenulum.  Objective: Blood pressure 118/76, pulse 64, temperature 97.9 F (36.6 C), temperature source Oral, resp. rate 18, height 5\' 3"  (1.6 m), weight 84.8 kg, last menstrual period 05/29/2018, SpO2 100 %, unknown if currently breastfeeding.  Patient Vitals for the past 24 hrs:  BP Temp Temp src Pulse Resp SpO2  03/12/19 0539 118/76 97.9 F (36.6 C) Oral 64 18 100 %  03/12/19 0315 103/76 98 F (36.7 C) Oral 81 18 100 %  03/11/19 2205 (!) 107/59 97.7 F (36.5 C) Oral - - -  03/11/19 1848 112/74 97.6 F (36.4 C) - - 20 -  03/11/19 1750 117/69 97.6 F (36.4 C) - - 20 -  03/11/19 1700 118/71 - - 82 20 -  03/11/19 1630 (!) 122/53 - - 98 20 -  03/11/19 1600 122/62 - - (!) 103 20 -  03/11/19 1530 123/72 - - 95 20 -  03/11/19 1515 127/60 - - 89 - -  03/11/19 1500 122/66 - - 99 20 -  03/11/19 1445 114/62 - - 93 (!) 26 -  03/11/19 1430 (!) 113/58 - - 94 (!) 24 -  03/11/19 1415 (!) 108/51 - - (!) 103 - -  03/11/19 1400 (!) 94/45 98.6 F (37 C)  Oral (!) 106 18 -   Physical Exam:  General: alert, cooperative, fatigued and no distress Lochia: appropriate Uterine Fundus: firm Incision: healing well, no significant drainage, no dehiscence, no significant erythema, left-sided labial swelling noted DVT Evaluation: No evidence of DVT seen on physical exam. No cords or calf tenderness. Calf/Ankle edema is present. Edema is present along entire length of legs, bilaterally and is severe.  Recent Labs    03/09/19 2029 03/12/19 0503  HGB 9.6* 8.7*  HCT 30.2* 27.3*    Assessment/Plan: Plan for discharge tomorrow, Breastfeeding, Lactation consult and Contraception POPs   -Rocephin discontinued, will discharge home with Keflex 500mg  QID for 7days -lasix order entered 20mg , daily for 2days to assist with swelling in legs -d/c home on HCTZ for swelling, PRN -repeat BMP in AM to check serum creatinine, order placed -CBC/w/ diff order placed for tomorrow AM  -plan of care made with Dr. Glo Herring during face-to-face consultation. Per Dr. Glo Herring, he will round on this patient personally this afternoon and offer to see her for her one week visit after discharge from the hospital at the [facility].   LOS: 3 days   Zoe Armstrong  03/12/2019, 1:36 PM

## 2019-03-13 LAB — BASIC METABOLIC PANEL
Anion gap: 7 (ref 5–15)
BUN: 9 mg/dL (ref 6–20)
CO2: 20 mmol/L — ABNORMAL LOW (ref 22–32)
Calcium: 7.7 mg/dL — ABNORMAL LOW (ref 8.9–10.3)
Chloride: 112 mmol/L — ABNORMAL HIGH (ref 98–111)
Creatinine, Ser: 0.89 mg/dL (ref 0.44–1.00)
GFR calc Af Amer: >60 mL/min (ref >60)
GFR calc non Af Amer: >60 mL/min (ref >60)
Glucose, Bld: 75 mg/dL (ref 70–99)
Potassium: 3.5 mmol/L (ref 3.5–5.1)
Sodium: 139 mmol/L (ref 135–145)

## 2019-03-13 LAB — CBC WITH DIFFERENTIAL/PLATELET
Abs Immature Granulocytes: 0.04 10*3/uL (ref 0.00–0.07)
Basophils Absolute: 0 10*3/uL (ref 0.0–0.1)
Basophils Relative: 0 %
Eosinophils Absolute: 0.1 10*3/uL (ref 0.0–0.5)
Eosinophils Relative: 2 %
HCT: 24.4 % — ABNORMAL LOW (ref 36.0–46.0)
Hemoglobin: 7.7 g/dL — ABNORMAL LOW (ref 12.0–15.0)
Immature Granulocytes: 0 %
Lymphocytes Relative: 17 %
Lymphs Abs: 1.5 10*3/uL (ref 0.7–4.0)
MCH: 27.3 pg (ref 26.0–34.0)
MCHC: 31.6 g/dL (ref 30.0–36.0)
MCV: 86.5 fL (ref 80.0–100.0)
Monocytes Absolute: 0.5 10*3/uL (ref 0.1–1.0)
Monocytes Relative: 6 %
Neutro Abs: 6.7 10*3/uL (ref 1.7–7.7)
Neutrophils Relative %: 75 %
Platelets: 212 10*3/uL (ref 150–400)
RBC: 2.82 MIL/uL — ABNORMAL LOW (ref 3.87–5.11)
RDW: 15.3 % (ref 11.5–15.5)
WBC: 8.9 10*3/uL (ref 4.0–10.5)
nRBC: 0 % (ref 0.0–0.2)

## 2019-03-13 MED ORDER — CEPHALEXIN 500 MG PO CAPS: 500.0000 mg | ORAL_CAPSULE | Freq: Four times a day (QID) | ORAL | 0 refills | Status: AC

## 2019-03-13 MED ORDER — FERROUS SULFATE 325 (65 FE) MG PO TABS: ORAL_TABLET | ORAL | 2 refills | Status: DC

## 2019-03-13 MED ORDER — OXYCODONE-ACETAMINOPHEN 5-325 MG PO TABS: 2.0000 | ORAL_TABLET | ORAL | 0 refills | Status: DC | PRN

## 2019-03-13 MED ORDER — IBUPROFEN 600 MG PO TABS: 600.0000 mg | ORAL_TABLET | Freq: Four times a day (QID) | ORAL | 0 refills | Status: DC

## 2019-03-13 MED ORDER — POLYETHYLENE GLYCOL 3350 17 G PO PACK: 17.0000 g | PACK | Freq: Every day | ORAL | 0 refills | Status: DC

## 2019-03-13 MED ORDER — LOPERAMIDE HCL 2 MG PO CAPS
2.0000 mg | ORAL_CAPSULE | Freq: Once | ORAL | Status: AC
  Administered 2019-03-13: 2 mg via ORAL
  Filled 2019-03-13: qty 1

## 2019-03-13 MED ORDER — BACID PO TABS
2.0000 | ORAL_TABLET | Freq: Three times a day (TID) | ORAL | Status: DC
  Administered 2019-03-13: 2 via ORAL
  Filled 2019-03-13 (×4): qty 2

## 2019-03-13 MED ORDER — SENNOSIDES-DOCUSATE SODIUM 8.6-50 MG PO TABS: 2.0000 | ORAL_TABLET | Freq: Every day | ORAL | 0 refills | Status: AC | PRN

## 2019-03-13 NOTE — Lactation Note (Signed)
This note was copied from a baby's chart. Lactation Consultation Note  Patient Name: Girl Marcayla Budge ZOXWR'U Date: 03/13/2019 Reason for consult: Follow-up assessment  Infant is 6 hrs old. Parents are exhausted. The extra slow-flow nipple was much better for infant with bottle feeding. Mom has Dr. Saul Fordyce newborn nipple at home. Volume parameters based on day of life were provided with an emphasis on feeding infant until she is content.   Mom's meds are OK. Dad had a question about the oxycodone Mom had been prescribed for her 4th degree tear. Per Marcello Moores Hale's "Medications & Mother's Milk," "opiate-naive Moms" should not have more than 40 mg oxycodone/day (8 tablets). I shared this with Mom and she verbalized understanding & stated that she didn't think she would take that many/day.   Since both parents are exhausted (they say they haven't fed since last Thursday; today is Monday), I suggested that parents go home & sleep while someone else bottle feeds the infant (Similac provided). Once Mom has gotten some good sleep, then she can continue to put the infant to the breast & she can pump whenever infant receives formula.   Matthias Hughs [facility] 03/13/2019, 10:55 AM

## 2019-03-13 NOTE — Lactation Note (Signed)
This note was copied from a baby's chart. Lactation Consultation Note Baby has 8% wt. Loss. In 39 hrs. Has good out put. LC recommends mom staying another day for Lactation assistance. Mom has a nipple now that is bleeding. LC will f/u w/mom today.   Patient Name: Zoe Armstrong [id number] Date: 03/13/2019 Reason for consult: Follow-up assessment;Difficult latch;Primapara   Maternal Data    Feeding Feeding Type: Breast Fed Nipple Type: Slow - flow  LATCH Score Latch: Grasps breast easily, tongue down, lips flanged, rhythmical sucking.  Audible Swallowing: A few with stimulation  Type of Nipple: Everted at rest and after stimulation  Comfort (Breast/Nipple): Filling, red/small blisters or bruises, mild/mod discomfort  Hold (Positioning): Assistance needed to correctly position infant at breast and maintain latch.  LATCH Score: 7  Interventions Interventions: Position options;Support pillows;Adjust position;DEBP;Expressed milk;Comfort gels;Breast compression;Reverse pressure;Breast massage;Breast feeding basics reviewed;Assisted with latch  Lactation Tools Discussed/Used Tools: Pump;Coconut oil;Comfort gels Breast pump type: Double-Electric Breast Pump Pump Review: Milk Storage(milk storage)   Consult Status Consult Status: Follow-up Date: 03/13/19 Follow-up type: In-patient    Zoe Armstrong 03/13/2019, 6:28 AM

## 2019-03-13 NOTE — Lactation Note (Signed)
This note was copied from a baby's chart. Lactation Consultation Note Baby 59 hrs old. Cluster feeding, fussy, hungry. Mom supplementing w/hand expression collecting colostrum.  Mom pumping when LC came into rm. Collecting colostrum. FOB used hand pump on the Rt. Breast collecting a little colostrum. Appeared to be pulling hard. Suggested he light up on the suction. Mom denied pain. Mom pumped 5 ml total. Baby cueing. Placed baby to Rt. Breast in football hold. Mom denied painful latch. Baby BF well. Had wide flange.  Baby had occassional swallows. Noted some softening in the breast. When baby came off breast, nipple had positional strip, some bleeding. Comfort gels given. Baby protruding tongue out of mouth. Possible posterior tight frenulum. Labial frenulum noted. Encouraged to have Peds. MD to assess. Gave colostrum in bottle w/slow flow nipples. Depending on baby's wt. Loss, mom need to stay for BF assistance.  Baby having good output. 7 voids and 3 stools in 39 hrs.  Patient Name: Zoe Armstrong [id number]'[id number] Date: 03/13/2019 Reason for consult: Follow-up assessment;Difficult latch;Primapara   Maternal Data    Feeding Feeding Type: Breast Fed  LATCH Score Latch: Grasps breast easily, tongue down, lips flanged, rhythmical sucking.  Audible Swallowing: A few with stimulation  Type of Nipple: Everted at rest and after stimulation  Comfort (Breast/Nipple): Filling, red/small blisters or bruises, mild/mod discomfort  Hold (Positioning): Assistance needed to correctly position infant at breast and maintain latch.  LATCH Score: 7  Interventions Interventions: Position options;Support pillows;Adjust position;DEBP;Expressed milk;Comfort gels;Breast compression;Reverse pressure;Breast massage;Breast feeding basics reviewed;Assisted with latch  Lactation Tools Discussed/Used Tools: Pump;Coconut oil;Comfort gels Breast pump type: Double-Electric Breast Pump Pump Review: Milk  Storage(milk storage)   Consult Status Consult Status: Follow-up Date: 03/13/19 Follow-up type: In-patient    Theodoro Kalata 03/13/2019, 4:55 AM

## 2019-03-17 ENCOUNTER — Encounter: Payer: Self-pay | Admitting: Obstetrics and Gynecology

## 2019-03-17 ENCOUNTER — Ambulatory Visit (INDEPENDENT_AMBULATORY_CARE_PROVIDER_SITE_OTHER): Payer: BC Managed Care – PPO | Admitting: Obstetrics and Gynecology

## 2019-03-17 ENCOUNTER — Other Ambulatory Visit: Payer: Self-pay

## 2019-03-17 DIAGNOSIS — O2303 Infections of kidney in pregnancy, third trimester: Secondary | ICD-10-CM

## 2019-03-17 NOTE — Progress Notes (Addendum)
Patient ID: Zoe Armstrong, female   DOB: [birth year], 28 y.o.   MRN: [id number]    [name] MRN [id number]  Date of birth: August 01, 1990  CC & HPI:  Zoe Armstrong is a 28 y.o. female presenting today for 4th degree laceration of delivery, and f/u pyelonephritis.Marland Kitchen Has to void every 2 hours, no problems with bowel movements. Has some flank back pain, is still taking keflex. Still has some edema in legs but is doing much better after taking fluid pill.  ROS:  ROS +4th degree lac +recurrent UTIs  Pertinent History Reviewed:   Reviewed:  Medical         Past Medical History:  Diagnosis Date   Acne    Anxiety 07/25/2014   Encounter for menstrual regulation 12/24/2015   Fatigue 12/24/2015   Menstrual extraction 04/10/2013   Weight gain 12/24/2015                              Surgical Hx:    Past Surgical History:  Procedure Laterality Date   NO PAST SURGERIES     Medications: Reviewed & Updated - see associated section                       Current Outpatient Medications:    cephALEXin (KEFLEX) 500 MG capsule, Take 1 capsule (500 mg total) by mouth 4 (four) times daily for 7 days., Disp: 28 capsule, Rfl: 0   ferrous sulfate 325 (65 FE) MG tablet, Please take every other day, continue good bowel regimen, Disp: 15 tablet, Rfl: 2   ibuprofen (ADVIL) 600 MG tablet, Take 1 tablet (600 mg total) by mouth every 6 (six) hours., Disp: 30 tablet, Rfl: 0   oxyCODONE-acetaminophen (PERCOCET/ROXICET) 5-325 MG tablet, Take 2 tablets by mouth every 4 (four) hours as needed (for pain scale equal to or greater than 7.)., Disp: 30 tablet, Rfl: 0   polyethylene glycol (MIRALAX / GLYCOLAX) 17 g packet, Take 17 g by mouth daily., Disp: 14 each, Rfl: 0   senna-docusate (SENOKOT-S) 8.6-50 MG tablet, Take 2 tablets by mouth daily as needed for mild constipation., Disp: 30 tablet, Rfl: 0   Social History: Reviewed -  reports that she has never  smoked. She has never used smokeless tobacco.  Objective Findings:  Vitals: Blood pressure 109/69, pulse (!) 42, height 5\' 3"  (1.6 m), weight 169 lb 9.6 oz (76.9 kg), unknown if currently breastfeeding.  PHYSICAL EXAMINATION General appearance - alert, well appearing, and in no distress Mental status - alert, oriented to person, place, and time, normal mood, behavior, speech, dress, motor activity, and thought processes Skin-Right axilla accessory gland tissue  Extremities- Pitting edema to mid-shin, but still dramatically improved over hospital condition.  PELVIC Vulva - asymmetry 1 cm gap between two edges at posterior perineal body Vagina - light redness good approximation of perineum Cervix - not examined Uterus - not examined  Assessment & Plan:   A:  1. 4th degree laceration, healing well 2. Recurrent UTIs 3. Urge incontinence 4. Breast feeding cease recommendations  P:  1. Activity restriction: no driving til fully able to use brakes w/o discomfort 2. Considering c-section for future pregnancy 3. Urge incontinence, will allow spont resolution as body heals, oonsider vesicare if stops breast feeding.    By signing my name below,  I, Arnette NorrisMari Johnson, attest that this documentation has been prepared under the direction and in the presence of Tilda BurrowFerguson, John V, MD. Electronically Signed: Arnette NorrisMari Johnson Medical Scribe. 03/17/19. 10:54 AM.  I personally performed the services described in this documentation, which was SCRIBED in my presence. The recorded information has been reviewed and considered accurate. It has been edited as necessary during review. Tilda BurrowJohn V Ferguson, MD

## 2019-03-17 NOTE — Progress Notes (Signed)
Please see documentation under Dwan Bolt of pp visit.

## 2019-03-22 ENCOUNTER — Telehealth: Payer: Self-pay

## 2019-03-22 NOTE — Telephone Encounter (Signed)
Spoke with patient regarding Mychart questionnaire and she states that she is not having any symptoms and was tested and confirmed negative. Patient states that the when she was in the hospital she found out she had a kidney infection and was treated.

## 2019-04-17 ENCOUNTER — Other Ambulatory Visit: Payer: Self-pay

## 2019-04-17 ENCOUNTER — Ambulatory Visit (INDEPENDENT_AMBULATORY_CARE_PROVIDER_SITE_OTHER): Payer: BC Managed Care – PPO | Admitting: Women's Health

## 2019-04-17 ENCOUNTER — Encounter: Payer: Self-pay | Admitting: Women's Health

## 2019-04-17 DIAGNOSIS — Z3202 Encounter for pregnancy test, result negative: Secondary | ICD-10-CM | POA: Diagnosis not present

## 2019-04-17 DIAGNOSIS — Z87448 Personal history of other diseases of urinary system: Secondary | ICD-10-CM

## 2019-04-17 LAB — POCT URINE PREGNANCY: Preg Test, Ur: NEGATIVE

## 2019-04-17 MED ORDER — LO LOESTRIN FE 1 MG-10 MCG / 10 MCG PO TABS: 1.0000 | ORAL_TABLET | Freq: Every day | ORAL | 3 refills | Status: DC

## 2019-04-17 NOTE — Progress Notes (Signed)
POSTPARTUM VISIT Patient name: Zoe Armstrong MRN [id number]  Date of birth: September 14, 1990 Chief Complaint:   postpartum visit (interested in birth control pills; + breastfeeding)  History of Present Illness:   Zoe Armstrong is a 28 y.o. G22P1001 Caucasian female being seen today for a postpartum visit. She is 5 weeks postpartum following a spontaneous vaginal delivery at 40.6 gestational weeks after IOL for postdates, Rt pyelonephritis. Anesthesia: epidural. Laceration: 4th degree. Says she wants C/S in future.  I have fully reviewed the prenatal and intrapartum course. Pregnancy complicated by recurrent uti's. Postpartum course has been uncomplicated. Bleeding no bleeding. Bowel function is some constipation, takes miralax/stool softener once/wk. Bladder function is some urge incontinence. Had recurrent UTIs prior to and during pregnancy, then pyelo @ 40wks- has seen urologist Dr. Liliane Shi postpartum.  Patient is not sexually active. Last sexual activity: prior to birth of baby.  Contraception method is wants pills. Plans to wean from breastfeeding very soon. Does not smoke, no h/o HTN, DVT/PE, CVA, MI, or migraines w/ aura.    Last pap 12/24/15.  Results were normal .  No LMP recorded.  Baby's course has been uncomplicated. Baby is feeding by breast    Edinburgh Postpartum Depression Screening: negative Edinburgh Postnatal Depression Scale - 04/17/19 1131      Edinburgh Postnatal Depression Scale:  In the Past 7 Days   I have been able to laugh and see the funny side of things.  0    I have looked forward with enjoyment to things.  0    I have blamed myself unnecessarily when things went wrong.  2    I have been anxious or worried for no good reason.  0    I have felt scared or panicky for no good reason.  2    Things have been getting on top of me.  3    I have been so unhappy that I have had difficulty sleeping.  0    I have felt sad or miserable.  0    I have been so unhappy that I have been  crying.  0    The thought of harming myself has occurred to me.  0    Edinburgh Postnatal Depression Scale Total  7      Review of Systems:   Pertinent items are noted in HPI Denies Abnormal vaginal discharge w/ itching/odor/irritation, headaches, visual changes, shortness of breath, chest pain, abdominal pain, severe nausea/vomiting, or problems with urination or bowel movements. Pertinent History Reviewed:  Reviewed past medical,surgical, obstetrical and family history.  Reviewed problem list, medications and allergies. OB History  Gravida Para Term Preterm AB Living  1 1 1  0 0 1  SAB TAB Ectopic Multiple Live Births  0 0 0 0 1    # Outcome Date GA Lbr Len/2nd Weight Sex Delivery Anes PTL Lv  1 Term 03/11/19 [redacted]w[redacted]d 11:23 / 01:20 8 lb 12.2 oz (3.975 kg) F Vag-Spont EPI  LIV   Physical Assessment:   Vitals:   04/17/19 1127  BP: 106/76  Pulse: 71  Weight: 151 lb (68.5 kg)  Height: 5\' 3"  (1.6 m)  Body mass index is 26.75 kg/m.       Physical Examination:   General appearance: alert, well appearing, and in no distress  Mental status: alert, oriented to person, place, and time  Skin: warm & dry   Cardiovascular: normal heart rate noted   Respiratory: normal respiratory effort, no distress   Breasts: deferred, no complaints  Abdomen: soft, non-tender   Pelvic: 4th degree lac healing well, some sutures still present  Rectal: no hemorrhoids  Extremities: no edema       Results for orders placed or performed in visit on 04/17/19 (from the past 24 hour(s))  POCT urine pregnancy   Collection Time: 04/17/19 11:34 AM  Result Value Ref Range   Preg Test, Ur Negative Negative    Assessment & Plan:  1) Postpartum exam 2) 5 wks s/p SVB w/ 4th degree lac after IOL for postdates/pyelo 3) Breastfeeding>weaning soon 4) Depression screening 5) Contraception counseling, pt prefers OCP (estrogen/progesterone), rx LoLoestrin, f/u 77mths 6) Constipation> gave printed prevention/relief  measures   Meds:  Meds ordered this encounter  Medications  . LO LOESTRIN FE 1 MG-10 MCG / 10 MCG tablet    Sig: Take 1 tablet by mouth daily.    Dispense:  3 Package    Refill:  3    For co-pay card, pt to text "Lo Loestrin Fe " to [phone]              Co-pay card must be run in second position  "other coverage code 3"  if denied d/t PA, step edit, or insurance denial    Order Specific Question:   Supervising Provider    Answer:   Florian Buff [[id number]]    Follow-up: Return in about 3 months (around 07/18/2019) for Pap & physical.   Orders Placed This Encounter  Procedures  . POCT urine pregnancy    [place], [facility] 04/17/2019 11:57 AM

## 2019-04-17 NOTE — Patient Instructions (Signed)
Constipation/Preventing constipation  Drink plenty of fluid, preferably water, throughout the day  Eat foods high in fiber such as fruits, vegetables, and grains  Exercise, such as walking, is a good way to keep your bowels regular  Drink warm fluids, especially warm prune juice, or decaf coffee  Eat a 1/2 cup of real oatmeal (not instant), 1/2 cup applesauce, and 1/2-1 cup warm prune juice every day  If needed, you may take Colace (docusate sodium) stool softener once or twice a day to help keep the stool soft.  If you still are having problems with constipation, you may take Miralax once daily as needed to help keep your bowels regular.

## 2019-04-18 DIAGNOSIS — Z029 Encounter for administrative examinations, unspecified: Secondary | ICD-10-CM

## 2019-04-19 ENCOUNTER — Other Ambulatory Visit: Payer: Self-pay | Admitting: *Deleted

## 2019-04-19 DIAGNOSIS — Z20822 Contact with and (suspected) exposure to covid-19: Secondary | ICD-10-CM

## 2019-04-21 LAB — NOVEL CORONAVIRUS, NAA: SARS-CoV-2, NAA: NOT DETECTED

## 2019-05-02 ENCOUNTER — Other Ambulatory Visit: Payer: Self-pay

## 2019-05-02 ENCOUNTER — Ambulatory Visit (INDEPENDENT_AMBULATORY_CARE_PROVIDER_SITE_OTHER): Payer: BC Managed Care – PPO | Admitting: Women's Health

## 2019-05-02 ENCOUNTER — Encounter: Payer: Self-pay | Admitting: Women's Health

## 2019-05-02 MED ORDER — NORETHINDRONE 0.35 MG PO TABS: 1.0000 | ORAL_TABLET | Freq: Every day | ORAL | 11 refills | Status: DC

## 2019-05-02 NOTE — Progress Notes (Signed)
   GYN VISIT Patient name: Zoe Armstrong MRN [id number]  Date of birth: 16-Mar-1991 Chief Complaint:   Follow-up (?stitch came out)  History of Present Illness:   Zoe Armstrong is a 28 y.o. G88P1001 Caucasian female being seen today for check on 4th degree laceration after SVB on 03/11/19. Sent mychart message this am, saying she had a stitch come out about a week ago, husband said it looked like there was a gap in laceration, and area has been painful since. Not having sex yet. Breastfeeding, needs refill on micronor (had originally thought she was going to wean, but decided not to), hasn't started LoLo. No passage of gas out of gap.      No LMP recorded. (Menstrual status: Lactating). The current method of family planning is abstinence.  Last pap 12/24/15. Results were:  normal Review of Systems:   Pertinent items are noted in HPI Denies fever/chills, dizziness, headaches, visual disturbances, fatigue, shortness of breath, chest pain, abdominal pain, vomiting, abnormal vaginal discharge/itching/odor/irritation, problems with periods, bowel movements, urination, or intercourse unless otherwise stated above.  Pertinent History Reviewed:  Reviewed past medical,surgical, social, obstetrical and family history.  Reviewed problem list, medications and allergies. Physical Assessment:   Vitals:   05/02/19 1509  BP: 108/66  Pulse: (!) 57  Weight: 150 lb (68 kg)  Height: 5\' 3"  (1.6 m)  Body mass index is 26.57 kg/m.       Physical Examination:   General appearance: alert, well appearing, and in no distress  Mental status: alert, oriented to person, place, and time  Skin: warm & dry   Cardiovascular: normal heart rate noted  Respiratory: normal respiratory effort, no distress  Abdomen: soft, non-tender   Pelvic: vaginal repair looks good, has skin tag just below perineum, then very small 0.5cm open area, tender, indurated. Co-exam w/ LHE, recommends topical estrogen  Extremities: no edema   No  results found for this or any previous visit (from the past 24 hour(s)).  Assessment & Plan:  1) 7wks s/p SVB> breastfeeding  2) 4th degree lac> small gap where stitch came out just above anus, gave premarin samples, small amt every night, no sex for 2wks, use lubrication when ready to try. Per Dr. Elonda Husky, if still tender/bothering her after she finishes breastfeeding f/u  3) Contraception management> rx micronor  Meds:  Meds ordered this encounter  Medications  . norethindrone (MICRONOR) 0.35 MG tablet    Sig: Take 1 tablet (0.35 mg total) by mouth daily.    Dispense:  1 Package    Refill:  11    Order Specific Question:   Supervising Provider    Answer:   Elonda Husky, LUTHER H [[id number]]    No orders of the defined types were placed in this encounter.   Return for As scheduled.  [place], [facility] 05/02/2019 4:02 PM

## 2019-07-18 ENCOUNTER — Other Ambulatory Visit (HOSPITAL_COMMUNITY)
Admission: RE | Admit: 2019-07-18 | Discharge: 2019-07-18 | Disposition: A | Discharge status: Home / Self Care | Payer: BC Managed Care – PPO | Source: Ambulatory Visit | Attending: Adult Health | Admitting: Adult Health

## 2019-07-18 ENCOUNTER — Other Ambulatory Visit: Payer: Self-pay

## 2019-07-18 ENCOUNTER — Encounter: Payer: Self-pay | Admitting: Adult Health

## 2019-07-18 ENCOUNTER — Ambulatory Visit (INDEPENDENT_AMBULATORY_CARE_PROVIDER_SITE_OTHER): Payer: BC Managed Care – PPO | Admitting: Adult Health

## 2019-07-18 VITALS — BP 104/68 | HR 68 | Ht 63.0 in | Wt 147.0 lb

## 2019-07-18 DIAGNOSIS — Z01419 Encounter for gynecological examination (general) (routine) without abnormal findings: Secondary | ICD-10-CM | POA: Insufficient documentation

## 2019-07-18 DIAGNOSIS — Z87448 Personal history of other diseases of urinary system: Secondary | ICD-10-CM

## 2019-07-18 NOTE — Progress Notes (Signed)
Patient ID: Zoe Armstrong, female   DOB: [birth year], 29 y.o.   MRN: [id number] History of Present Illness: Hetvi is a 29 year old white female, married, G1P1 had vaginal delivery 03/11/19 with 4th degree laceration, has used PVC and is better, she saw urologist and is getting PT and is taking Macrobid nightly.She is breast feeding and is on Micronor.  PCP is [facility].   Current Medications, Allergies, Past Medical History, Past Surgical History, Family History and Social History were reviewed in [employer] record.     Review of Systems: Patient denies any headaches, hearing loss, fatigue, blurred vision, shortness of breath, chest pain, abdominal pain, problems with bowel movements, urination, or intercourse. No joint pain or mood swings.    Physical Exam:BP 104/68 (BP Location: Left Arm, Patient Position: Sitting, Cuff Size: Normal)   Pulse 68   Ht 5\' 3"  (1.6 m)   Wt 147 lb (66.7 kg)   LMP 06/26/2019 (Exact Date)   Breastfeeding Yes   BMI 26.04 kg/m  General:  Well developed, well nourished, no acute distress Skin:  Warm and dry Neck:  Midline trachea, normal thyroid, good ROM, no lymphadenopathy Lungs; Clear to auscultation bilaterally Breast:  No dominant palpable mass, retraction, or nipple discharge Cardiovascular: Regular rate and rhythm Abdomen:  Soft, non tender, no hepatosplenomegaly Pelvic:  External genitalia is normal in appearance, no lesions.no separation noted.  The vagina is normal in appearance. Urethra has no lesions or masses. The cervix is bulbous.Pap with high risk HPV 16/18 genotyping performed.   Uterus is felt to be normal size, shape, and contour.  No adnexal masses or tenderness noted.Bladder is non tender, no masses felt. Extremities/musculoskeletal:  No swelling or varicosities noted, no clubbing or cyanosis Psych:  No mood changes, alert and cooperative,seems happy Fall risk is low PHQ 2 score is 0. Examination chaperoned by 06/28/2019 CMA.  Impression and plan: 1. Encounter for gynecological examination with Papanicolaou smear of cervix Pap sent Physical in  1 year Pap in 3 if needed Continue Micronor has refills   2. Fourth degree perineal laceration Can use PVC 2-3 x weekly as needed   3. History of pyelonephritis

## 2019-07-23 LAB — CYTOLOGY - PAP
Comment: NEGATIVE
Diagnosis: NEGATIVE
High risk HPV: NEGATIVE

## 2019-08-25 ENCOUNTER — Ambulatory Visit: Payer: BC Managed Care – PPO | Attending: Internal Medicine

## 2019-08-25 ENCOUNTER — Other Ambulatory Visit: Payer: Self-pay

## 2019-08-25 DIAGNOSIS — Z20822 Contact with and (suspected) exposure to covid-19: Secondary | ICD-10-CM

## 2019-08-26 LAB — NOVEL CORONAVIRUS, NAA: SARS-CoV-2, NAA: NOT DETECTED

## 2019-12-21 ENCOUNTER — Other Ambulatory Visit: Payer: Self-pay

## 2019-12-21 ENCOUNTER — Telehealth: Payer: Self-pay | Admitting: Adult Health

## 2019-12-21 ENCOUNTER — Ambulatory Visit (INDEPENDENT_AMBULATORY_CARE_PROVIDER_SITE_OTHER): Payer: BC Managed Care – PPO | Admitting: *Deleted

## 2019-12-21 DIAGNOSIS — Z3201 Encounter for pregnancy test, result positive: Secondary | ICD-10-CM | POA: Diagnosis not present

## 2019-12-21 LAB — POCT URINE PREGNANCY: Preg Test, Ur: POSITIVE — AB

## 2019-12-21 NOTE — Telephone Encounter (Signed)
Spoke with patient. She has been on birth control. Missed a few doses but otherwise has taken it as she should. Pt bleeding since beginning of June. Took UPT last night and it was positive. Advised patient to come in for nurse pregnancy test visit. Pt scheduled to come in today.

## 2019-12-21 NOTE — Telephone Encounter (Signed)
Needs to speak to a nurse in REf to Still cycle but now has + preg test

## 2019-12-21 NOTE — Progress Notes (Signed)
Chart reviewed for nurse visit. Agree with plan of care. Will check Felecia Shelling, NP 12/21/2019 3:36 PM

## 2019-12-21 NOTE — Progress Notes (Signed)
   NURSE VISIT- PREGNANCY CONFIRMATION   SUBJECTIVE:  Zoe Armstrong is a 29 y.o. G51P1001 female at Unknown by uncertain LMP of No LMP recorded. Here for pregnancy confirmation.  Home pregnancy test: positive x 2  She reports bleeding.  She is not taking prenatal vitamins.    OBJECTIVE:  There were no vitals taken for this visit.  Appears well, in no apparent distress OB History  Gravida Para Term Preterm AB Living  1 1 1  0 0 1  SAB TAB Ectopic Multiple Live Births  0 0 0 0 1    # Outcome Date GA Lbr Len/2nd Weight Sex Delivery Anes PTL Lv  1 Term 03/11/19 [redacted]w[redacted]d 11:23 / 01:20 8 lb 12.2 oz (3.975 kg) F Vag-Spont EPI  LIV    No results found for this or any previous visit (from the past 24 hour(s)).  ASSESSMENT: Positive pregnancy test, Unknown by LMP    PLAN: Schedule for dating ultrasound pending hcg results.  Prenatal vitamins: continue   Nausea medicines: not currently needed   OB packet given: No  [redacted]w[redacted]d  12/21/2019 2:39 PM

## 2019-12-22 LAB — BETA HCG QUANT (REF LAB): hCG Quant: 133540 m[IU]/mL

## 2019-12-24 IMAGING — US UPPER LEFT VENOUS EXTREMITY ULTRASOUND
1 series · 13 of 24 positions shown · non-contrast
Comparison: None.

CLINICAL DATA: Intermittent left upper extremity pain. Evaluate for
DVT.



[Series 1: upper left venous extremity ultrasound · 13 of 41 slices shown]
[im 1/41]
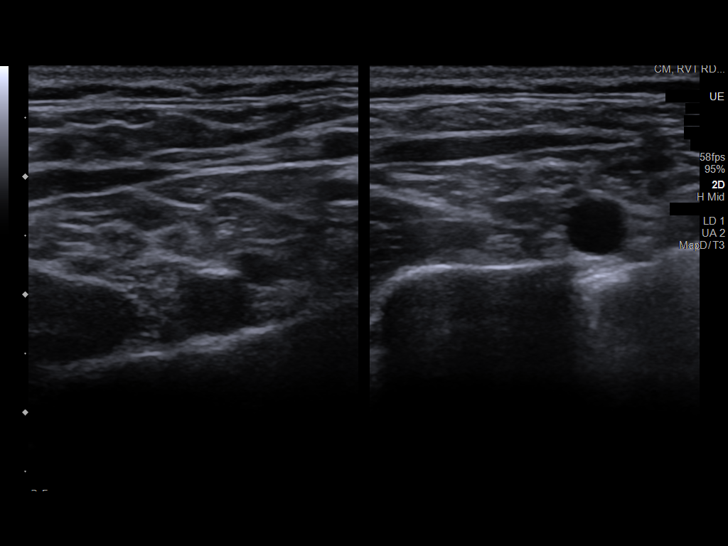
[im 4/41]
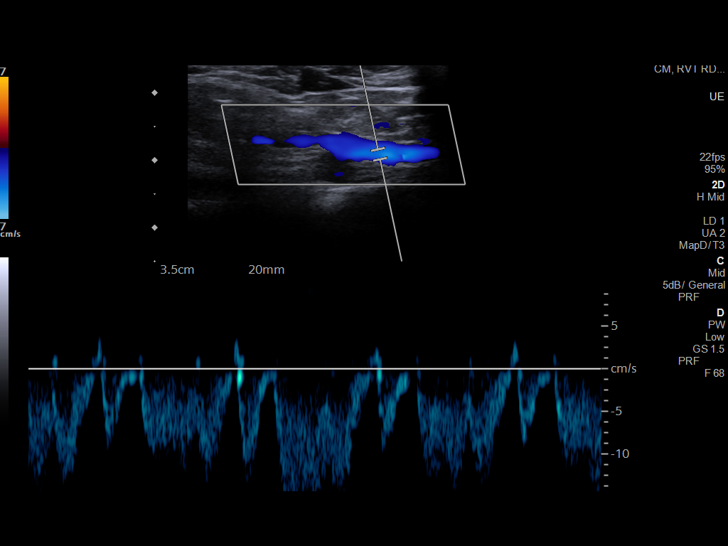
[im 7/41]
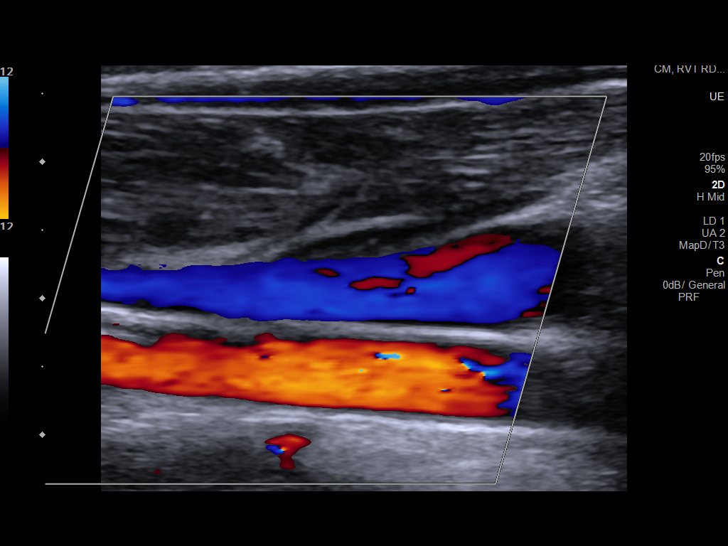
[im 11/41]
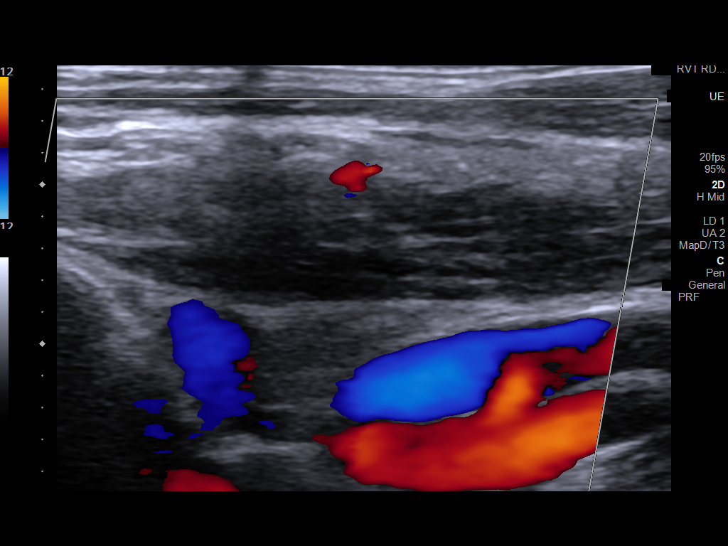
[im 14/41]
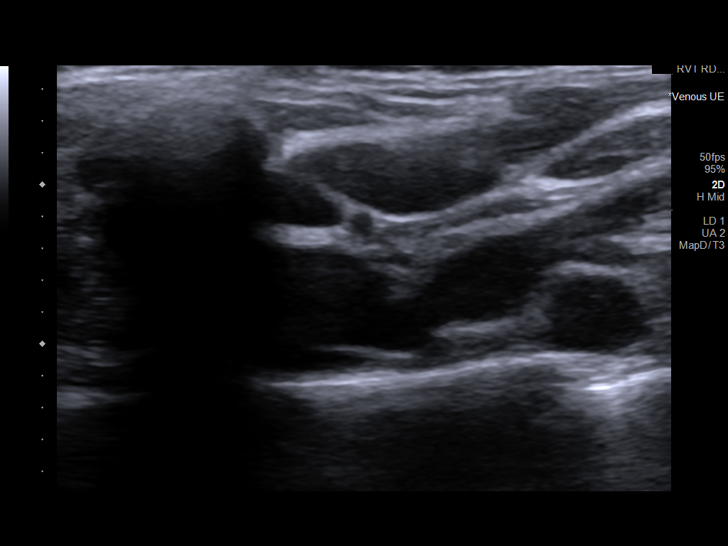
[im 18/41]
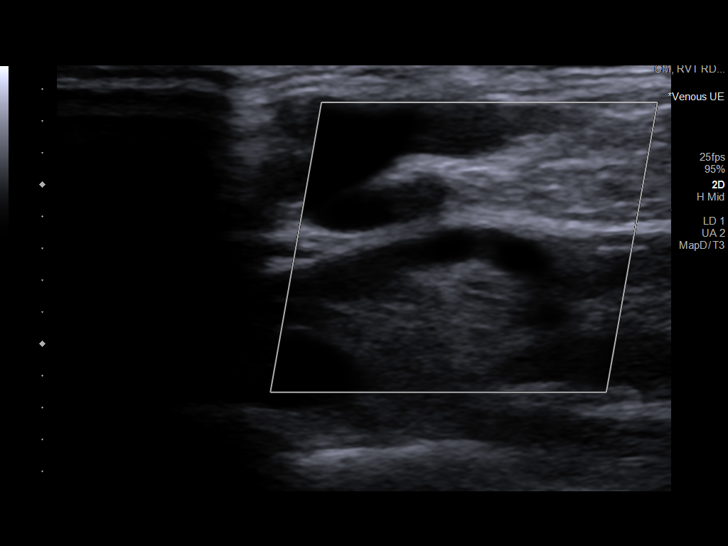
[im 21/41]
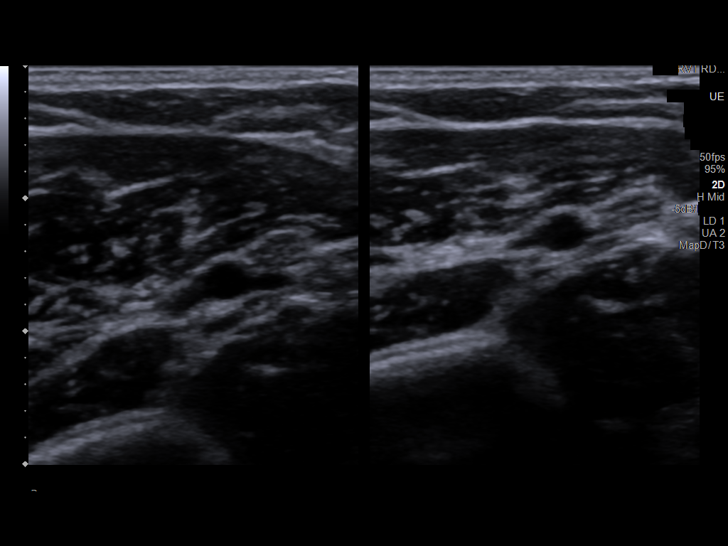
[im 23/41]
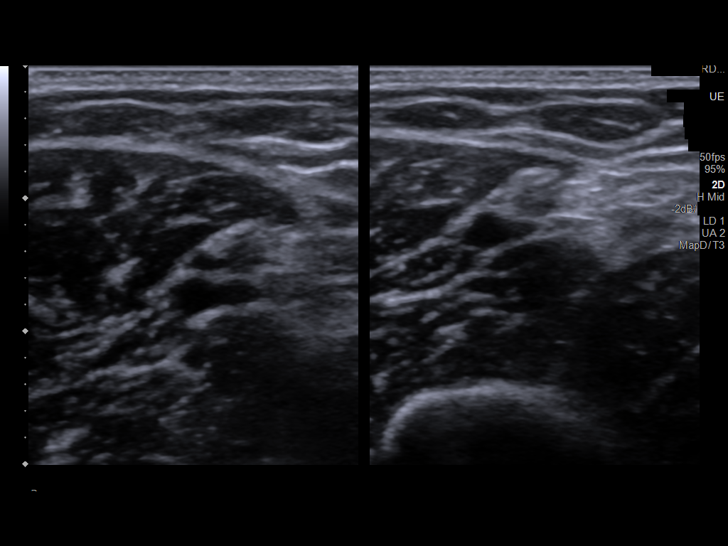
[im 27/41]
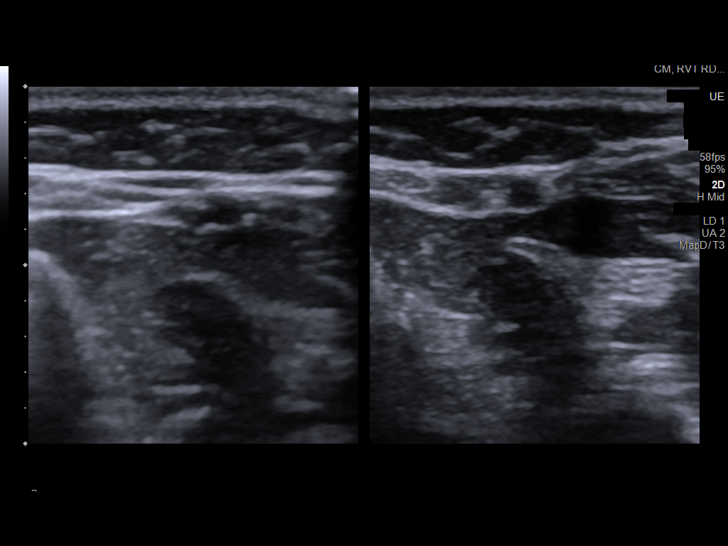
[im 30/41]
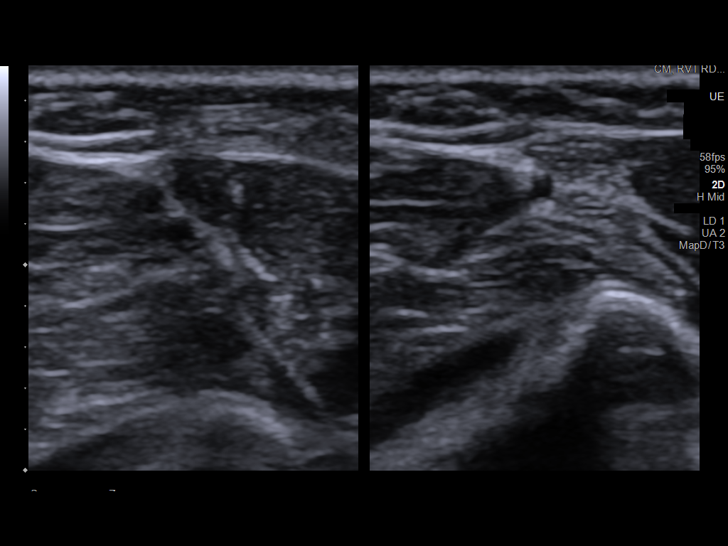
[im 34/41]
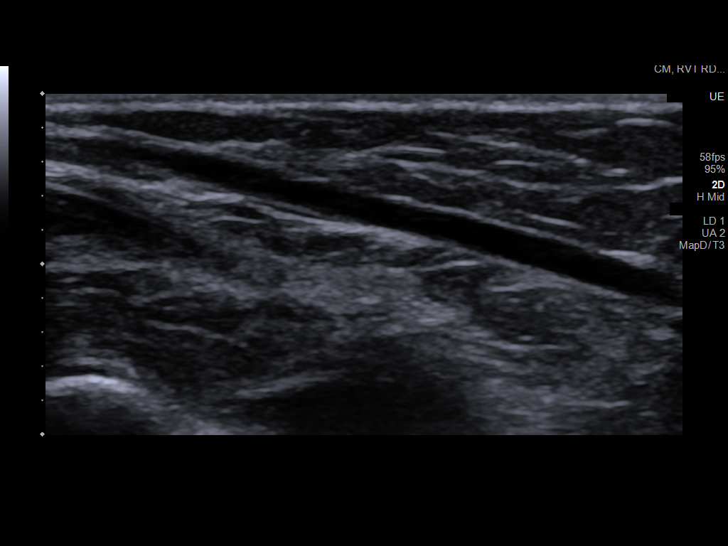
[im 37/41]
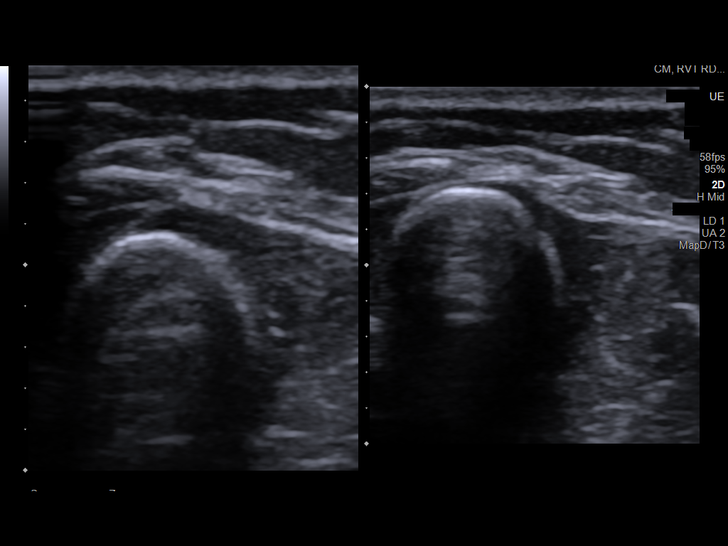
[im 41/41]
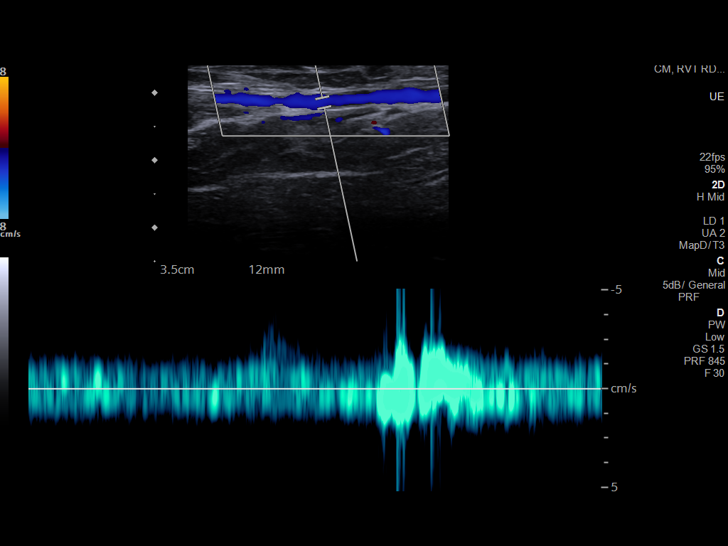

[13 of 24 positions shown; findings below may reference images not displayed]

FINDINGS: Contralateral Subclavian Vein: Respiratory phasicity is normal and
symmetric with the symptomatic side. No evidence of thrombus. Normal
compressibility.

Internal Jugular Vein: No evidence of thrombus. Normal
compressibility, respiratory phasicity and response to augmentation.

Subclavian Vein: No evidence of thrombus. Normal compressibility,
respiratory phasicity and response to augmentation.

Axillary Vein: No evidence of thrombus. Normal compressibility,
respiratory phasicity and response to augmentation.

Cephalic Vein: No evidence of thrombus. Normal compressibility,
respiratory phasicity and response to augmentation.

Basilic Vein: No evidence of thrombus. Normal compressibility,
respiratory phasicity and response to augmentation.

Brachial Veins: No evidence of thrombus. Normal compressibility,
respiratory phasicity and response to augmentation.

Radial Veins: No evidence of thrombus. Normal compressibility,
respiratory phasicity and response to augmentation.

Ulnar Veins: No evidence of thrombus. Normal compressibility,
respiratory phasicity and response to augmentation.

Venous Reflux:  None visualized.

Other Findings:  None visualized.
IMPRESSION: No evidence of DVT within the left upper extremity.

## 2019-12-25 ENCOUNTER — Other Ambulatory Visit: Payer: Self-pay

## 2019-12-25 ENCOUNTER — Telehealth: Payer: Self-pay | Admitting: *Deleted

## 2019-12-25 ENCOUNTER — Inpatient Hospital Stay (HOSPITAL_COMMUNITY)
Admission: AD | Admit: 2019-12-25 | Discharge: 2019-12-25 | Disposition: A | Discharge status: Home / Self Care | Payer: BC Managed Care – PPO | Attending: Obstetrics and Gynecology | Admitting: Obstetrics and Gynecology

## 2019-12-25 ENCOUNTER — Encounter (HOSPITAL_COMMUNITY): Payer: Self-pay | Admitting: Obstetrics and Gynecology

## 2019-12-25 ENCOUNTER — Inpatient Hospital Stay (HOSPITAL_COMMUNITY): Payer: BC Managed Care – PPO

## 2019-12-25 DIAGNOSIS — Z6790 Unspecified blood type, Rh positive: Secondary | ICD-10-CM

## 2019-12-25 DIAGNOSIS — Z20822 Contact with and (suspected) exposure to covid-19: Secondary | ICD-10-CM | POA: Diagnosis not present

## 2019-12-25 DIAGNOSIS — Z01812 Encounter for preprocedural laboratory examination: Secondary | ICD-10-CM | POA: Diagnosis present

## 2019-12-25 DIAGNOSIS — O209 Hemorrhage in early pregnancy, unspecified: Secondary | ICD-10-CM

## 2019-12-25 DIAGNOSIS — Z3A00 Weeks of gestation of pregnancy not specified: Secondary | ICD-10-CM

## 2019-12-25 DIAGNOSIS — O99891 Other specified diseases and conditions complicating pregnancy: Secondary | ICD-10-CM | POA: Diagnosis not present

## 2019-12-25 DIAGNOSIS — O283 Abnormal ultrasonic finding on antenatal screening of mother: Secondary | ICD-10-CM | POA: Diagnosis not present

## 2019-12-25 DIAGNOSIS — O4690 Antepartum hemorrhage, unspecified, unspecified trimester: Secondary | ICD-10-CM

## 2019-12-25 DIAGNOSIS — O26899 Other specified pregnancy related conditions, unspecified trimester: Secondary | ICD-10-CM | POA: Insufficient documentation

## 2019-12-25 DIAGNOSIS — M549 Dorsalgia, unspecified: Secondary | ICD-10-CM | POA: Diagnosis not present

## 2019-12-25 DIAGNOSIS — O3680X0 Pregnancy with inconclusive fetal viability, not applicable or unspecified: Secondary | ICD-10-CM

## 2019-12-25 LAB — CBC
HCT: 38.6 % (ref 36.0–46.0)
Hemoglobin: 12.7 g/dL (ref 12.0–15.0)
MCH: 27.8 pg (ref 26.0–34.0)
MCHC: 32.9 g/dL (ref 30.0–36.0)
MCV: 84.5 fL (ref 80.0–100.0)
Platelets: 267 10*3/uL (ref 150–400)
RBC: 4.57 MIL/uL (ref 3.87–5.11)
RDW: 13.1 % (ref 11.5–15.5)
WBC: 6.3 10*3/uL (ref 4.0–10.5)
nRBC: 0 % (ref 0.0–0.2)

## 2019-12-25 LAB — COMPREHENSIVE METABOLIC PANEL
ALT: 13 U/L (ref 0–44)
AST: 14 U/L — ABNORMAL LOW (ref 15–41)
Albumin: 4.2 g/dL (ref 3.5–5.0)
Alkaline Phosphatase: 52 U/L (ref 38–126)
Anion gap: 8 (ref 5–15)
BUN: 8 mg/dL (ref 6–20)
CO2: 23 mmol/L (ref 22–32)
Calcium: 9.2 mg/dL (ref 8.9–10.3)
Chloride: 107 mmol/L (ref 98–111)
Creatinine, Ser: 0.57 mg/dL (ref 0.44–1.00)
GFR calc Af Amer: >60 mL/min (ref >60)
GFR calc non Af Amer: >60 mL/min (ref >60)
Glucose, Bld: 94 mg/dL (ref 70–99)
Potassium: 3.5 mmol/L (ref 3.5–5.1)
Sodium: 138 mmol/L (ref 135–145)
Total Bilirubin: 0.4 mg/dL (ref 0.3–1.2)
Total Protein: 6.8 g/dL (ref 6.5–8.1)

## 2019-12-25 LAB — WET PREP, GENITAL
Clue Cells Wet Prep HPF POC: NONE SEEN
Sperm: NONE SEEN
Trich, Wet Prep: NONE SEEN
Yeast Wet Prep HPF POC: NONE SEEN

## 2019-12-25 LAB — SARS CORONAVIRUS 2 (TAT 6-24 HRS): SARS Coronavirus 2: NEGATIVE

## 2019-12-25 LAB — HCG, QUANTITATIVE, PREGNANCY: hCG, Beta Chain, Quant, S: 180703 m[IU]/mL — ABNORMAL HIGH (ref <5)

## 2019-12-25 NOTE — MAU Provider Note (Signed)
History     CSN: [id number]  Arrival date and time: 12/25/19 1427   Chief Complaint  Patient presents with  . Vaginal Bleeding  . Possible Pregnancy  . Back Pain   29 y.o. G2P1001 @unknown  gestation presenting with VB. Reports bleeding started on June 2nd. Bleeding has been daily and is enough to need a pad. Bleeding varies between red and brown. Denies abdmminal pain or cramping. She recently stopped breastfeeding and was not menstruating.   OB History    Gravida  2   Para  1   Term  1   Preterm  0   AB  0   Living  1     SAB  0   TAB  0   Ectopic  0   Multiple  0   Live Births  1           Past Medical History:  Diagnosis Date  . Acne   . Anxiety 07/25/2014  . Encounter for menstrual regulation 12/24/2015  . Fatigue 12/24/2015  . Menstrual extraction 04/10/2013  . Weight gain 12/24/2015    Past Surgical History:  Procedure Laterality Date  . NO PAST SURGERIES      Family History  Problem Relation Age of Onset  . Cancer Mother        breast   . Thyroid disease Mother   . Cancer Maternal Uncle        lung that spread everywhere  . Diabetes Paternal Aunt   . Heart attack Paternal Aunt   . Cancer Maternal Grandfather   . Heart disease Paternal Grandfather   . Schizophrenia Paternal Grandmother   . Heart attack Paternal Grandmother   . Alzheimer's disease Maternal Grandmother   . Thyroid disease Maternal Aunt     Social History   Tobacco Use  . Smoking status: Never Smoker  . Smokeless tobacco: Never Used  Vaping Use  . Vaping Use: Never used  Substance Use Topics  . Alcohol use: No  . Drug use: No    Allergies: No Known Allergies  No medications prior to admission.    Review of Systems  Gastrointestinal: Negative for abdominal pain.  Genitourinary: Positive for vaginal bleeding.   Physical Exam   Blood pressure 107/65, pulse 71, temperature 98.3 F (36.8 C), temperature source Oral, resp. rate 16, height 5\' 3"  (1.6 m),  weight 63.3 kg, SpO2 100 %, currently breastfeeding.  Physical Exam Vitals and nursing note reviewed. Exam conducted with a chaperone present.  Constitutional:      General: She is not in acute distress. HENT:     Head: Normocephalic and atraumatic.  Cardiovascular:     Rate and Rhythm: Normal rate.  Pulmonary:     Effort: Pulmonary effort is normal. No respiratory distress.  Abdominal:     General: There is no distension.     Tenderness: There is no abdominal tenderness.  Genitourinary:    Comments: External: no lesions or erythema Vagina: rugated, pink, moist, moderate bloody discharge, cleared with 3 fox swabs Uterus: + enlarged, anteverted, non tender, no CMT Adnexae: no masses, no tenderness left, no tenderness right Cervix closed  Musculoskeletal:        General: Normal range of motion.     Cervical back: Normal range of motion.  Skin:    General: Skin is warm and dry.  Neurological:     General: No focal deficit present.     Mental Status: She is alert and oriented to person, place,  and time.  Psychiatric:        Mood and Affect: Mood is anxious.    Results for orders placed or performed during the hospital encounter of 12/25/19 (from the past 24 hour(s))  CBC     Status: None   Collection Time: 12/25/19  3:29 PM  Result Value Ref Range   WBC 6.3 4.0 - 10.5 K/uL   RBC 4.57 3.87 - 5.11 MIL/uL   Hemoglobin 12.7 12.0 - 15.0 g/dL   HCT 38.6 36 - 46 %   MCV 84.5 80.0 - 100.0 fL   MCH 27.8 26.0 - 34.0 pg   MCHC 32.9 30.0 - 36.0 g/dL   RDW 13.1 11.5 - 15.5 %   Platelets 267 150 - 400 K/uL   nRBC 0.0 0.0 - 0.2 %  hCG, quantitative, pregnancy     Status: Abnormal   Collection Time: 12/25/19  3:29 PM  Result Value Ref Range   hCG, Beta Chain, Quant, S 180,703 (H) <5 mIU/mL  Wet prep, genital     Status: Abnormal   Collection Time: 12/25/19  4:19 PM   Specimen: PATH Cytology Cervicovaginal Ancillary Only  Result Value Ref Range   Yeast Wet Prep HPF POC NONE SEEN  NONE SEEN   Trich, Wet Prep NONE SEEN NONE SEEN   Clue Cells Wet Prep HPF POC NONE SEEN NONE SEEN   WBC, Wet Prep HPF POC FEW (A) NONE SEEN   Sperm NONE SEEN    US OB LESS THAN 14 WEEKS WITH OB TRANSVAGINAL  Result Date: 12/25/2019 CLINICAL DATA:  29 year old female with vaginal bleeding in the 1st trimester of pregnancy. Quantitative beta HCG 4 days ago was 133,540. EXAM: OBSTETRIC <14 WK [place] AND TRANSVAGINAL OB US TECHNIQUE: Both transabdominal and transvaginal ultrasound examinations were performed for complete evaluation of the gestation as well as the maternal uterus, adnexal regions, and pelvic cul-de-sac. Transvaginal technique was performed to assess early pregnancy. COMPARISON:  None. FINDINGS: Intrauterine gestational sac: No normal gestational sac identified. Maternal uterus/adnexae: There is a complex multi cystic appearing mass within the endometrial canal (image 13) with only some internal vascular elements identified (image 45). No recognizable fetal parts. No associated pelvic free fluid. Both ovaries seem to remain normal, the right is better visualized with transabdominal technique. The left ovary is 3.2 x 1.6 x 1.9 cm. The right ovary is 3.3 x 1.2 x 2.5 cm. IMPRESSION: No normal gestational sac and no recognizable fetus. 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 Recommend GYN consultation. Electronically Signed   By: Genevie Ann M.D.   On: 12/25/2019 17:01   MAU Course  Procedures  MDM Labs and [place] ordered and reviewed. Discussed presentation and clinical findings with Dr. Rosana Hoes. Dr. Rosana Hoes at bedside to discuss results with pt and spouse. Pre-procedure labs collected. Stable for discharge home.  Assessment and Plan   1. Abnormal obstetric ultrasound scan   2. Vaginal bleeding in pregnancy   3. Blood type, Rh positive    Discharge home Follow up for surgery this week-scheduler to  call pt Return precautions  Allergies as of 12/25/2019   No Known Allergies     Medication List    STOP taking these medications   multivitamin-prenatal 27-0.8 MG Tabs tablet   nitrofurantoin (macrocrystal-monohydrate) 100 MG capsule Commonly known as: MACROBID   norethindrone 0.35 MG tablet Commonly known as: Derinda Late, CNM 12/25/2019, 6:51  PM  

## 2019-12-25 NOTE — Telephone Encounter (Signed)
Pt aware of QHCG and is still bleeding, it is brown now, will get dating [place] at [facility] 7/2 at 11:30 am be there at 11:15 with full bladder, wuill get Angie to check to see if precert needed

## 2019-12-25 NOTE — MAU Note (Signed)
Recently found preg. Thought her period started June 2, and it just hadn't stopped, started as red, now is brown.  had a preg test at [facility],  was positive. On birthcontrol and breast feeding. Some cramps

## 2019-12-25 NOTE — Telephone Encounter (Signed)
Pt called Angie at front desk to set up her ultrasound appointment. Patient was informed that next available would be on week of July 12. Pt stated that she felt like it should be sooner because she had been having some bleeding. Advised patient we would check with Victorino Dike and let her know.

## 2019-12-25 NOTE — Discharge Instructions (Signed)
Molar Pregnancy  A molar pregnancy (hydatidiform mole) is a mass of tissue that grows in the uterus after an egg is fertilized incorrectly. The mass does not develop into a fetus, and is considered an abnormal pregnancy. Usually, the pregnancy ends on its own through miscarriage. In some cases, treatment may be required. What are the causes? This condition is caused by an egg that is fertilized incorrectly so that it has abnormal genetic material (chromosomes). This can result in one of two types of molar pregnancies:  Complete molar pregnancy. This is when all of the chromosomes in the fertilized egg are from the father, and none are from the mother.  Partial molar pregnancy. This is when the fertilized egg has chromosomes from the father and mother, but it has too many chromosomes. What increases the risk? This condition is more likely to develop in:  Women who are over the age of 57 or under the age of 42.  Women who have had a molar pregnancy in the past (very rare). Other possible risk factors include:  Smoking more than 15 cigarettes a day.  History of infertility.  Having a blood type A, B, or AB.  Having a lack (deficiency) of vitamin A.  Using birth control pills (oral contraceptives). What are the signs or symptoms? Symptoms of this condition include:  Vaginal bleeding.  Missed menstrual period.  The uterus growing faster than expected for a normal pregnancy.  Severe nausea and vomiting.  Severe pressure or pain in the uterus.  Abnormal ovarian cysts (theca lutein cysts).  Vaginal discharge that looks like grapes.  High blood pressure (early onset of preeclampsia).  Overactive thyroid gland (hyperthyroidism).  Not having enough red blood cells or hemoglobin (anemia). How is this diagnosed? This condition is diagnosed based on ultrasound and blood tests. How is this treated? Usually, molar pregnancies end on their own by miscarriage. A health care provider  may manage this condition by:  Monitoring the levels of pregnancy hormones in your blood to make sure that the hormone levels are decreasing as expected.  Giving you a medicine called Rho (D) immune globulin. This medicine helps to prevent problems that may occur in future pregnancies as a result of a protein on red blood cells (Rh factor). You may be given this medicine if you do not have an Rh factor (you are Rh negative) and your sex partner has an Rh factor (he is Rh positive).  Putting you on chemotherapy. This involves taking medicines that regulate levels of pregnancy hormones. This may be done if your pregnancy hormone levels are not decreasing as expected.  Performing a procedure called dilation and curettage (D&C), or vacuum curettage. These are minor procedures that involve scraping or suctioning the molar pregnancy out of the uterus and removing it through the vagina. Even if a molar pregnancy ends on its own, one of these procedures may be done to make sure that all the abnormal tissue is out of the uterus.  Doing a surgical removal of the uterus (hysterectomy). Follow these instructions at home:  Avoid getting pregnant for 6-12 months, or as long as told by your health care provider. To avoid getting pregnant, avoid having sex or use a reliable form of birth control every time you have sex.  Take over-the-counter and prescription medicines only as told by your health care provider.  Rest as needed, and slowly return to your normal activities.  Think about joining a support group. If you are struggling with grief, ask  your health care provider for help.  Keep all follow-up visits as told by your health care provider. This is important. You may need follow-up blood tests or ultrasounds. Contact a health care provider if:  You continue to have irregular vaginal bleeding.  You have abdominal pain. Summary  A molar pregnancy (hydatidiform mole) is a mass of tissue that grows in  the uterus after an egg is fertilized incorrectly.  This condition is more likely to develop in women who are over the age of 76 or under the age of 45 or women who have had a molar pregnancy in the past.  The most common symptom of this condition is vaginal bleeding.  Usually, molar pregnancy ends with a miscarriage, and no treatment is needed. This information is not intended to replace advice given to you by your health care provider. Make sure you discuss any questions you have with your health care provider. Document Revised: 05/28/2017 Document Reviewed: 08/19/2016 Elsevier Patient Education  2020 [employer].

## 2019-12-25 NOTE — Addendum Note (Signed)
Addended by: Cyril Mourning A on: 12/25/2019 12:12 PM   Modules accepted: Orders

## 2019-12-26 ENCOUNTER — Other Ambulatory Visit: Payer: Self-pay

## 2019-12-26 ENCOUNTER — Other Ambulatory Visit (HOSPITAL_COMMUNITY): Payer: Self-pay | Admitting: Obstetrics & Gynecology

## 2019-12-26 ENCOUNTER — Other Ambulatory Visit: Payer: Self-pay | Admitting: Obstetrics & Gynecology

## 2019-12-26 ENCOUNTER — Encounter (HOSPITAL_COMMUNITY): Payer: Self-pay | Admitting: Obstetrics & Gynecology

## 2019-12-26 DIAGNOSIS — O020 Blighted ovum and nonhydatidiform mole: Secondary | ICD-10-CM

## 2019-12-26 LAB — GC/CHLAMYDIA PROBE AMP (~~LOC~~) NOT AT ARMC
Chlamydia: NEGATIVE
Comment: NEGATIVE
Comment: NORMAL
Neisseria Gonorrhea: NEGATIVE

## 2019-12-26 NOTE — Progress Notes (Addendum)
I called Dr. Olivia Mackie office and spoke with Nani Ravens and asked for orders for [facility]. Patient tested negative for Covid__6/28/2021 and has been in quarantine since that time.

## 2019-12-27 ENCOUNTER — Ambulatory Visit (HOSPITAL_COMMUNITY)
Admission: RE | Admit: 2019-12-27 | Discharge: 2019-12-27 | Disposition: A | Discharge status: Home / Self Care | Payer: BC Managed Care – PPO | Attending: Obstetrics & Gynecology | Admitting: Obstetrics & Gynecology

## 2019-12-27 ENCOUNTER — Encounter (HOSPITAL_COMMUNITY)
Admission: RE | Disposition: A | Discharge status: Home / Self Care | Payer: Self-pay | Source: Home / Self Care | Attending: Obstetrics & Gynecology

## 2019-12-27 ENCOUNTER — Other Ambulatory Visit: Payer: Self-pay

## 2019-12-27 ENCOUNTER — Ambulatory Visit (HOSPITAL_COMMUNITY)
Admission: RE | Admit: 2019-12-27 | Discharge: 2019-12-27 | Disposition: A | Discharge status: Home / Self Care | Payer: BC Managed Care – PPO | Source: Ambulatory Visit | Attending: Obstetrics & Gynecology | Admitting: Obstetrics & Gynecology

## 2019-12-27 ENCOUNTER — Encounter (HOSPITAL_COMMUNITY): Payer: Self-pay | Admitting: Obstetrics & Gynecology

## 2019-12-27 ENCOUNTER — Ambulatory Visit (HOSPITAL_COMMUNITY): Payer: BC Managed Care – PPO | Admitting: Anesthesiology

## 2019-12-27 DIAGNOSIS — Z3A00 Weeks of gestation of pregnancy not specified: Secondary | ICD-10-CM | POA: Diagnosis not present

## 2019-12-27 DIAGNOSIS — Z8349 Family history of other endocrine, nutritional and metabolic diseases: Secondary | ICD-10-CM | POA: Insufficient documentation

## 2019-12-27 DIAGNOSIS — Z833 Family history of diabetes mellitus: Secondary | ICD-10-CM | POA: Diagnosis not present

## 2019-12-27 DIAGNOSIS — Z803 Family history of malignant neoplasm of breast: Secondary | ICD-10-CM | POA: Diagnosis not present

## 2019-12-27 DIAGNOSIS — Z820 Family history of epilepsy and other diseases of the nervous system: Secondary | ICD-10-CM | POA: Insufficient documentation

## 2019-12-27 DIAGNOSIS — Z801 Family history of malignant neoplasm of trachea, bronchus and lung: Secondary | ICD-10-CM | POA: Insufficient documentation

## 2019-12-27 DIAGNOSIS — O020 Blighted ovum and nonhydatidiform mole: Secondary | ICD-10-CM | POA: Diagnosis present

## 2019-12-27 DIAGNOSIS — Z818 Family history of other mental and behavioral disorders: Secondary | ICD-10-CM | POA: Diagnosis not present

## 2019-12-27 DIAGNOSIS — Z8249 Family history of ischemic heart disease and other diseases of the circulatory system: Secondary | ICD-10-CM | POA: Diagnosis not present

## 2019-12-27 DIAGNOSIS — Z809 Family history of malignant neoplasm, unspecified: Secondary | ICD-10-CM | POA: Insufficient documentation

## 2019-12-27 DIAGNOSIS — Z87442 Personal history of urinary calculi: Secondary | ICD-10-CM | POA: Diagnosis not present

## 2019-12-27 HISTORY — PX: DILATION AND EVACUATION: SHX1459

## 2019-12-27 HISTORY — DX: Headache, unspecified: R51.9

## 2019-12-27 HISTORY — DX: Personal history of urinary calculi: Z87.442

## 2019-12-27 HISTORY — PX: OPERATIVE ULTRASOUND: SHX5996

## 2019-12-27 SURGERY — DILATION AND EVACUATION, UTERUS
Anesthesia: General | Site: Vagina

## 2019-12-27 MED ORDER — PROPOFOL 10 MG/ML IV BOLUS
INTRAVENOUS | Status: DC | PRN
  Administered 2019-12-27: 200 mg via INTRAVENOUS

## 2019-12-27 MED ORDER — OXYTOCIN 10 UNIT/ML IJ SOLN
INTRAMUSCULAR | Status: AC
  Filled 2019-12-27: qty 1

## 2019-12-27 MED ORDER — LACTATED RINGERS IV SOLN: INTRAVENOUS | Status: DC

## 2019-12-27 MED ORDER — PHENYLEPHRINE HCL (PRESSORS) 10 MG/ML IV SOLN
INTRAVENOUS | Status: DC | PRN
Start: 2019-12-27 — End: 2019-12-27
  Administered 2019-12-27 (×2): 80 ug via INTRAVENOUS

## 2019-12-27 MED ORDER — ACETAMINOPHEN 500 MG PO TABS
1000.0000 mg | ORAL_TABLET | Freq: Once | ORAL | Status: AC
  Administered 2019-12-27: 1000 mg via ORAL
  Filled 2019-12-27: qty 2

## 2019-12-27 MED ORDER — ONDANSETRON HCL 4 MG/2ML IJ SOLN
INTRAMUSCULAR | Status: DC | PRN
  Administered 2019-12-27: 4 mg via INTRAVENOUS

## 2019-12-27 MED ORDER — SCOPOLAMINE 1 MG/3DAYS TD PT72
1.0000 | MEDICATED_PATCH | TRANSDERMAL | Status: DC
  Administered 2019-12-27: 1 via TRANSDERMAL

## 2019-12-27 MED ORDER — ACETAMINOPHEN 500 MG PO TABS: 1000.0000 mg | ORAL_TABLET | Freq: Once | ORAL | Status: DC

## 2019-12-27 MED ORDER — FENTANYL CITRATE (PF) 100 MCG/2ML IJ SOLN: 25.0000 ug | INTRAMUSCULAR | Status: DC | PRN

## 2019-12-27 MED ORDER — FERROUS FUMARATE-FOLIC ACID 324-1 MG PO TABS: 1.0000 | ORAL_TABLET | Freq: Every morning | ORAL | 1 refills | Status: DC

## 2019-12-27 MED ORDER — FENTANYL CITRATE (PF) 250 MCG/5ML IJ SOLN
INTRAMUSCULAR | Status: AC
  Filled 2019-12-27: qty 5

## 2019-12-27 MED ORDER — FENTANYL CITRATE (PF) 100 MCG/2ML IJ SOLN
INTRAMUSCULAR | Status: DC | PRN
  Administered 2019-12-27: 50 ug via INTRAVENOUS

## 2019-12-27 MED ORDER — PROPOFOL 10 MG/ML IV BOLUS
INTRAVENOUS | Status: AC
  Filled 2019-12-27: qty 20

## 2019-12-27 MED ORDER — EPHEDRINE SULFATE 50 MG/ML IJ SOLN
INTRAMUSCULAR | Status: DC | PRN
Start: 2019-12-27 — End: 2019-12-27
  Administered 2019-12-27: 5 mg via INTRAVENOUS
  Administered 2019-12-27: 10 mg via INTRAVENOUS
  Administered 2019-12-27 (×2): 5 mg via INTRAVENOUS
  Administered 2019-12-27: 10 mg via INTRAVENOUS

## 2019-12-27 MED ORDER — DOXYCYCLINE HYCLATE 100 MG IV SOLR
200.0000 mg | INTRAVENOUS | Status: AC
  Administered 2019-12-27: 200 mg via INTRAVENOUS
  Filled 2019-12-27: qty 200

## 2019-12-27 MED ORDER — OXYCODONE HCL 5 MG PO TABS: 5.0000 mg | ORAL_TABLET | ORAL | 0 refills | Status: DC | PRN

## 2019-12-27 MED ORDER — DEXAMETHASONE SODIUM PHOSPHATE 4 MG/ML IJ SOLN
INTRAMUSCULAR | Status: DC | PRN
  Administered 2019-12-27: 4 mg via INTRAVENOUS

## 2019-12-27 MED ORDER — MIDAZOLAM HCL 2 MG/2ML IJ SOLN
INTRAMUSCULAR | Status: AC
  Filled 2019-12-27: qty 2

## 2019-12-27 MED ORDER — BUPIVACAINE HCL (PF) 0.5 % IJ SOLN
INTRAMUSCULAR | Status: AC
  Filled 2019-12-27: qty 30

## 2019-12-27 MED ORDER — BUPIVACAINE HCL (PF) 0.5 % IJ SOLN
INTRAMUSCULAR | Status: DC | PRN
  Administered 2019-12-27: 10 mL

## 2019-12-27 MED ORDER — IBUPROFEN 600 MG PO TABS
600.0000 mg | ORAL_TABLET | Freq: Four times a day (QID) | ORAL | 1 refills | Status: DC | PRN
Start: 2019-12-27 — End: 2022-07-16

## 2019-12-27 MED ORDER — CHLORHEXIDINE GLUCONATE 0.12 % MT SOLN
15.0000 mL | Freq: Once | OROMUCOSAL | Status: AC
  Administered 2019-12-27: 15 mL via OROMUCOSAL
  Filled 2019-12-27: qty 15

## 2019-12-27 MED ORDER — ORAL CARE MOUTH RINSE: 15.0000 mL | Freq: Once | OROMUCOSAL | Status: AC

## 2019-12-27 MED ORDER — POVIDONE-IODINE 10 % EX SWAB: 2.0000 "application " | Freq: Once | CUTANEOUS | Status: DC

## 2019-12-27 MED ORDER — LIDOCAINE HCL (CARDIAC) PF 100 MG/5ML IV SOSY
PREFILLED_SYRINGE | INTRAVENOUS | Status: DC | PRN
  Administered 2019-12-27: 60 mg via INTRAVENOUS

## 2019-12-27 MED ORDER — MIDAZOLAM HCL 5 MG/5ML IJ SOLN
INTRAMUSCULAR | Status: DC | PRN
  Administered 2019-12-27: 2 mg via INTRAVENOUS

## 2019-12-27 MED ORDER — OXYTOCIN-SODIUM CHLORIDE 30-0.9 UT/500ML-% IV SOLN
INTRAVENOUS | Status: DC | PRN
Start: 2019-12-27 — End: 2019-12-27
  Administered 2019-12-27: 10 [IU] via INTRAVENOUS

## 2019-12-27 SURGICAL SUPPLY — 22 items
CATH ROBINSON RED A/P 16FR (CATHETERS) ×4 IMPLANT
DECANTER SPIKE VIAL GLASS SM (MISCELLANEOUS) IMPLANT
FILTER UTR ASPR ASSEMBLY (MISCELLANEOUS) ×4 IMPLANT
GLOVE BIO SURGEON STRL SZ 6.5 (GLOVE) ×3 IMPLANT
GLOVE BIO SURGEONS STRL SZ 6.5 (GLOVE) ×1
GLOVE BIOGEL PI IND STRL 7.0 (GLOVE) ×4 IMPLANT
GLOVE BIOGEL PI INDICATOR 7.0 (GLOVE) ×4
GOWN STRL REUS W/ TWL LRG LVL3 (GOWN DISPOSABLE) ×4 IMPLANT
GOWN STRL REUS W/TWL LRG LVL3 (GOWN DISPOSABLE) ×4
HOSE CONNECTING 18IN BERKELEY (TUBING) ×4 IMPLANT
KIT BERKELEY 1ST TRI 3/8 NO TR (MISCELLANEOUS) ×4 IMPLANT
KIT BERKELEY 1ST TRIMESTER 3/8 (MISCELLANEOUS) ×4 IMPLANT
NS IRRIG 1000ML POUR BTL (IV SOLUTION) ×4 IMPLANT
PACK VAGINAL MINOR WOMEN LF (CUSTOM PROCEDURE TRAY) ×4 IMPLANT
PAD OB MATERNITY 4.3X12.25 (PERSONAL CARE ITEMS) ×4 IMPLANT
SET BERKELEY SUCTION TUBING (SUCTIONS) ×4 IMPLANT
TOWEL GREEN STERILE FF (TOWEL DISPOSABLE) ×8 IMPLANT
UNDERPAD 30X36 HEAVY ABSORB (UNDERPADS AND DIAPERS) ×4 IMPLANT
VACURETTE 10 RIGID CVD (CANNULA) ×4 IMPLANT
VACURETTE 7MM CVD STRL WRAP (CANNULA) IMPLANT
VACURETTE 8 RIGID CVD (CANNULA) IMPLANT
VACURETTE 9 RIGID CVD (CANNULA) IMPLANT

## 2019-12-27 NOTE — Discharge Instructions (Signed)
Molar Pregnancy  A molar pregnancy (hydatidiform mole) is a mass of tissue that grows in the uterus after an egg is fertilized incorrectly. The mass does not develop into a fetus, and is considered an abnormal pregnancy. Usually, the pregnancy ends on its own through miscarriage. In some cases, treatment may be required. What are the causes? This condition is caused by an egg that is fertilized incorrectly so that it has abnormal genetic material (chromosomes). This can result in one of two types of molar pregnancies:  Complete molar pregnancy. This is when all of the chromosomes in the fertilized egg are from the father, and none are from the mother.  Partial molar pregnancy. This is when the fertilized egg has chromosomes from the father and mother, but it has too many chromosomes. What increases the risk? This condition is more likely to develop in:  Women who are over the age of 57 or under the age of 42.  Women who have had a molar pregnancy in the past (very rare). Other possible risk factors include:  Smoking more than 15 cigarettes a day.  History of infertility.  Having a blood type A, B, or AB.  Having a lack (deficiency) of vitamin A.  Using birth control pills (oral contraceptives). What are the signs or symptoms? Symptoms of this condition include:  Vaginal bleeding.  Missed menstrual period.  The uterus growing faster than expected for a normal pregnancy.  Severe nausea and vomiting.  Severe pressure or pain in the uterus.  Abnormal ovarian cysts (theca lutein cysts).  Vaginal discharge that looks like grapes.  High blood pressure (early onset of preeclampsia).  Overactive thyroid gland (hyperthyroidism).  Not having enough red blood cells or hemoglobin (anemia). How is this diagnosed? This condition is diagnosed based on ultrasound and blood tests. How is this treated? Usually, molar pregnancies end on their own by miscarriage. A health care provider  may manage this condition by:  Monitoring the levels of pregnancy hormones in your blood to make sure that the hormone levels are decreasing as expected.  Giving you a medicine called Rho (D) immune globulin. This medicine helps to prevent problems that may occur in future pregnancies as a result of a protein on red blood cells (Rh factor). You may be given this medicine if you do not have an Rh factor (you are Rh negative) and your sex partner has an Rh factor (he is Rh positive).  Putting you on chemotherapy. This involves taking medicines that regulate levels of pregnancy hormones. This may be done if your pregnancy hormone levels are not decreasing as expected.  Performing a procedure called dilation and curettage (D&C), or vacuum curettage. These are minor procedures that involve scraping or suctioning the molar pregnancy out of the uterus and removing it through the vagina. Even if a molar pregnancy ends on its own, one of these procedures may be done to make sure that all the abnormal tissue is out of the uterus.  Doing a surgical removal of the uterus (hysterectomy). Follow these instructions at home:  Avoid getting pregnant for 6-12 months, or as long as told by your health care provider. To avoid getting pregnant, avoid having sex or use a reliable form of birth control every time you have sex.  Take over-the-counter and prescription medicines only as told by your health care provider.  Rest as needed, and slowly return to your normal activities.  Think about joining a support group. If you are struggling with grief, ask  your health care provider for help.  Keep all follow-up visits as told by your health care provider. This is important. You may need follow-up blood tests or ultrasounds. Contact a health care provider if:  You continue to have irregular vaginal bleeding.  You have abdominal pain. Summary  A molar pregnancy (hydatidiform mole) is a mass of tissue that grows in  the uterus after an egg is fertilized incorrectly.  This condition is more likely to develop in women who are over the age of 3 or under the age of 64 or women who have had a molar pregnancy in the past.  The most common symptom of this condition is vaginal bleeding.  Usually, molar pregnancy ends with a miscarriage, and no treatment is needed. This information is not intended to replace advice given to you by your health care provider. Make sure you discuss any questions you have with your health care provider. Document Revised: 05/28/2017 Document Reviewed: 08/19/2016 Elsevier Patient Education  2020 [employer] Dilation and Curettage or Vacuum Curettage, Care After These instructions give you information about caring for yourself after your procedure. Your doctor may also give you more specific instructions. Call your doctor if you have any problems or questions after your procedure. Follow these instructions at home: Activity  Do not drive or use heavy machinery while taking prescription pain medicine.  For 24 hours after your procedure, avoid driving.  Take short walks often, followed by rest periods. Ask your doctor what activities are safe for you. After one or two days, you may be able to return to your normal activities.  Do not lift anything that is heavier than 10 lb (4.5 kg) until your doctor approves.  For at least 2 weeks, or as long as told by your doctor: ? Do not douche. ? Do not use tampons. ? Do not have sex. General instructions   Take over-the-counter and prescription medicines only as told by your doctor. This is very important if you take blood thinning medicine.  Do not take baths, swim, or use a hot tub until your doctor approves. Take showers instead of baths.  Wear compression stockings as told by your doctor.  It is up to you to get the results of your procedure. Ask your doctor when your results will be ready.  Keep all follow-up visits as told  by your doctor. This is important. Contact a doctor if:  You have very bad cramps that get worse or do not get better with medicine.  You have very bad pain in your belly (abdomen).  You cannot drink fluids without throwing up (vomiting).  You get pain in a different part of the area between your belly and thighs (pelvis).  You have bad-smelling discharge from your vagina.  You have a rash. Get help right away if:  You are bleeding a lot from your vagina. A lot of bleeding means soaking more than one sanitary pad in an hour, for 2 hours in a row.  You have clumps of blood (blood clots) coming from your vagina.  You have a fever or chills.  Your belly feels very tender or hard.  You have chest pain.  You have trouble breathing.  You cough up blood.  You feel dizzy.  You feel light-headed.  You pass out (faint).  You have pain in your neck or shoulder area. Summary  Take short walks often, followed by rest periods. Ask your doctor what activities are safe for you. After one or  two days, you may be able to return to your normal activities.  Do not lift anything that is heavier than 10 lb (4.5 kg) until your doctor approves.  Do not take baths, swim, or use a hot tub until your doctor approves. Take showers instead of baths.  Contact your doctor if you have any symptoms of infection, like bad-smelling discharge from your vagina. This information is not intended to replace advice given to you by your health care provider. Make sure you discuss any questions you have with your health care provider. Document Revised: 05/28/2017 Document Reviewed: 03/02/2016 Elsevier Patient Education  2020 [employer].

## 2019-12-27 NOTE — Anesthesia Procedure Notes (Signed)
Procedure Name: LMA Insertion Date/Time: 12/27/2019 3:26 PM Performed by: Gwenyth Allegra, CRNA Pre-anesthesia Checklist: Patient identified, Emergency Drugs available, Suction available and Patient being monitored Patient Re-evaluated:Patient Re-evaluated prior to induction Oxygen Delivery Method: Circle System Utilized Preoxygenation: Pre-oxygenation with 100% oxygen Induction Type: IV induction Ventilation: Mask ventilation without difficulty LMA: LMA inserted LMA Size: 4.0 Number of attempts: 1 Airway Equipment and Method: Bite block Placement Confirmation: positive ETCO2 Tube secured with: Tape Dental Injury: Teeth and Oropharynx as per pre-operative assessment

## 2019-12-27 NOTE — Anesthesia Preprocedure Evaluation (Addendum)
Anesthesia Evaluation  Patient identified by MRN, date of birth, ID band Patient awake    Reviewed: Allergy & Precautions, NPO status , Patient's Chart, lab work & pertinent test results  Airway Mallampati: I  TM Distance: >3 FB Neck ROM: Full    Dental no notable dental hx. (+) Teeth Intact, Dental Advisory Given   Pulmonary neg pulmonary ROS,    Pulmonary exam normal breath sounds clear to auscultation       Cardiovascular negative cardio ROS Normal cardiovascular exam Rhythm:Regular Rate:Normal     Neuro/Psych  Headaches, PSYCHIATRIC DISORDERS Anxiety    GI/Hepatic negative GI ROS, Neg liver ROS,   Endo/Other  negative endocrine ROS  Renal/GU negative Renal ROS  negative genitourinary   Musculoskeletal negative musculoskeletal ROS (+)   Abdominal Normal abdominal exam  (+)   Peds  Hematology negative hematology ROS (+)   Anesthesia Other Findings Molar pregnancy  Reproductive/Obstetrics                           Anesthesia Physical Anesthesia Plan  ASA: II  Anesthesia Plan: General   Post-op Pain Management:    Induction: Intravenous  PONV Risk Score and Plan: 3 and Ondansetron, Dexamethasone and Midazolam  Airway Management Planned: LMA  Additional Equipment: None  Intra-op Plan:   Post-operative Plan: Extubation in OR  Informed Consent: I have reviewed the patients History and Physical, chart, labs and discussed the procedure including the risks, benefits and alternatives for the proposed anesthesia with the patient or authorized representative who has indicated his/her understanding and acceptance.     Dental advisory given  Plan Discussed with: CRNA  Anesthesia Plan Comments:        Anesthesia Quick Evaluation

## 2019-12-27 NOTE — H&P (Signed)
Zoe Armstrong is an 29 y.o. female. G2P1001 No LMP recorded (lmp unknown). Patient is pregnant. Scheduled for D&E for suspected molar pregnancy  Pertinent Gynecological History:  Blood transfusions: none Sexually transmitted diseases: no past history Last pap: normal Date: 06/2019  Menstrual History:  No LMP recorded (lmp unknown). Patient is pregnant.    Past Medical History:  Diagnosis Date  . Acne   . Anxiety 07/25/2014   12/26/19- not currently  . Encounter for menstrual regulation 12/24/2015  . Fatigue 12/24/2015  . Headache    migraines - 12/26/19- "have not had one in a while"  . History of kidney stones    passed  . Menstrual extraction 04/10/2013  . Weight gain 12/24/2015    Past Surgical History:  Procedure Laterality Date  . NO PAST SURGERIES      Family History  Problem Relation Age of Onset  . Cancer Mother        breast   . Thyroid disease Mother   . Cancer Maternal Uncle        lung that spread everywhere  . Diabetes Paternal Aunt   . Heart attack Paternal Aunt   . Cancer Maternal Grandfather   . Heart disease Paternal Grandfather   . Schizophrenia Paternal Grandmother   . Heart attack Paternal Grandmother   . Alzheimer's disease Maternal Grandmother   . Thyroid disease Maternal Aunt     Social History:  reports that she has never smoked. She has never used smokeless tobacco. She reports that she does not drink alcohol and does not use drugs.  Allergies: No Known Allergies  Medications Prior to Admission  Medication Sig Dispense Refill Last Dose  . Prenatal Vit-Fe Fumarate-FA (PRENATAL MULTIVITAMIN) TABS tablet Take 1 tablet by mouth daily at 12 noon.   12/26/2019 at Unknown time    Review of Systems  Constitutional: Negative.   Respiratory: Negative.   Gastrointestinal: Negative.   Genitourinary: Positive for vaginal bleeding. Negative for pelvic pain.    Blood pressure (!) 100/42, pulse (!) 58, temperature 97.9 F (36.6 C), temperature  source Oral, resp. rate 20, height 5\' 3"  (1.6 m), weight 59 kg, SpO2 100 %, currently breastfeeding. Physical Exam Constitutional:      Appearance: Normal appearance.  HENT:     Head: Normocephalic and atraumatic.  Cardiovascular:     Rate and Rhythm: Normal rate.  Pulmonary:     Effort: Pulmonary effort is normal.  Abdominal:     General: Abdomen is flat.  Skin:    General: Skin is warm and dry.  Neurological:     Mental Status: She is alert.  Psychiatric:        Mood and Affect: Mood normal.        Behavior: Behavior normal.     No results found for this or any previous visit (from the past 24 hour(s)).  OB LESS THAN 14 WEEKS WITH OB TRANSVAGINAL  Result Date: 12/25/2019 CLINICAL DATA:  29 year old female with vaginal bleeding in the 1st trimester of pregnancy. Quantitative beta HCG 4 days ago was 133,540. EXAM: OBSTETRIC <14 WK 26 AND TRANSVAGINAL OB US TECHNIQUE: Both transabdominal and transvaginal ultrasound examinations were performed for complete evaluation of the gestation as well as the maternal uterus, adnexal regions, and pelvic cul-de-sac. Transvaginal technique was performed to assess early pregnancy. COMPARISON:  None. FINDINGS: Intrauterine gestational sac: No normal gestational sac identified. Maternal uterus/adnexae: There is a complex multi cystic appearing mass within the endometrial canal (image 13) with only  some internal vascular elements identified (image 45). No recognizable fetal parts. No associated pelvic free fluid. Both ovaries seem to remain normal, the right is better visualized with transabdominal technique. The left ovary is 3.2 x 1.6 x 1.9 cm. The right ovary is 3.3 x 1.2 x 2.5 cm. IMPRESSION: No normal gestational sac and no recognizable fetus. 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 Recommend GYN consultation. Electronically Signed   By: Odessa Fleming M.D.    On: 12/25/2019 17:01    Assessment/Plan: Molar pregnancy suspected. Patient desires surgical management with D&E.  The risks of surgery were discussed in detail with the patient including but not limited to: bleeding which may require transfusion or reoperation; infection which may require prolonged hospitalization or re-hospitalization and antibiotic therapy; injury to bowel, bladder, ureters and major vessels or other surrounding organs; formation of adhesions; need for additional procedures including laparotomy; thromboembolic phenomenon; incisional problems and other postoperative or anesthesia complications.  Patient was told that the likelihood that her condition and symptoms will be treated effectively with this surgical management was very high; the postoperative expectations were also discussed in detail. The patient also understands the alternative treatment options which were discussed in full. All questions were answered.  Zoe Armstrong 12/27/2019, 3:09 PM

## 2019-12-27 NOTE — Transfer of Care (Signed)
Immediate Anesthesia Transfer of Care Note  Patient: Zoe Armstrong  Procedure(s) Performed: DILATATION AND EVACUATION (N/A Vagina ) OPERATIVE ULTRASOUND (N/A Abdomen)  Patient Location: PACU  Anesthesia Type:General  Level of Consciousness: awake and drowsy  Airway & Oxygen Therapy: Patient Spontanous Breathing and Patient connected to face mask oxygen  Post-op Assessment: Report given to RN and Post -op Vital signs reviewed and stable  Post vital signs: Reviewed and stable  Last Vitals:  Vitals Value Taken Time  BP 117/91 12/27/19 1612  Temp    Pulse 121 12/27/19 1613  Resp 12 12/27/19 1613  SpO2 97 % 12/27/19 1613  Vitals shown include unvalidated device data.  Last Pain:  Vitals:   12/27/19 1239  TempSrc:   PainSc: 0-No pain         Complications: No complications documented.

## 2019-12-27 NOTE — Anesthesia Postprocedure Evaluation (Signed)
Anesthesia Post Note  Patient: Zoe Armstrong  Procedure(s) Performed: DILATATION AND EVACUATION (N/A Vagina ) OPERATIVE ULTRASOUND (N/A Abdomen)     Patient location during evaluation: PACU Anesthesia Type: General Level of consciousness: awake and alert, oriented and patient cooperative Pain management: pain level controlled Vital Signs Assessment: post-procedure vital signs reviewed and stable Respiratory status: spontaneous breathing, nonlabored ventilation and respiratory function stable Cardiovascular status: blood pressure returned to baseline and stable Postop Assessment: no apparent nausea or vomiting Anesthetic complications: no   No complications documented.  Last Vitals:  Vitals:   12/27/19 1700 12/27/19 1715  BP: (!) 104/57 104/62  Pulse: 91 85  Resp: 18 18  Temp:  36.6 C  SpO2: 100% 100%    Last Pain:  Vitals:   12/27/19 1645  TempSrc:   PainSc: 0-No pain                 Mahima Hottle,E. Florita Nitsch

## 2019-12-27 NOTE — Op Note (Signed)
Mame Mackley PROCEDURE DATE: 12/27/2019  PREOPERATIVE DIAGNOSIS: molar pregnancy with uncertain dates POSTOPERATIVE DIAGNOSIS: The same PROCEDURE:     Dilation and Evacuation SURGEON:  Scheryl Darter MD  INDICATIONS: 29 y.o. G2P1001 with suspected molar pregnancy at uncertain weeks gestation, needing surgical completion.  Risks of surgery were discussed with the patient including but not limited to: bleeding which may require transfusion; infection which may require antibiotics; injury to uterus or surrounding organs; need for additional procedures including laparotomy or laparoscopy; possibility of intrauterine scarring which may impair future fertility; and other postoperative/anesthesia complications. Written informed consent was obtained.    FINDINGS:  A 10 week size uterus, moderate amounts of products of conception, specimen sent to pathology.  ANESTHESIA:    Monitored intravenous sedation, paracervical block. INTRAVENOUS FLUIDS:  1500 ml of LR ESTIMATED BLOOD LOSS:  700 ml. SPECIMENS:  Products of conception sent to pathology COMPLICATIONS:  None immediate.  PROCEDURE DETAILS:  The patient received intravenous Doxycycline while in the preoperative area.  She was then taken to the operating room where monitored intravenous sedation was administered and was found to be adequate.  After an adequate timeout was performed, she was placed in the dorsal lithotomy position and examined; then prepped and draped in the sterile manner.   Her bladder was catheterized for 300 ml of clear, yellow urine. A vaginal speculum was then placed in the patient's vagina and a single tooth tenaculum was applied to the anterior lip of the cervix.  A paracervical block using 10 ml of 0.5% Marcaine was administered. The cervix was gently dilated to accommodate a 10 mm suction curette that was gently advanced to the uterine fundus.  The suction device was then activated and curette slowly rotated to clear the uterus of  products of conception.  [place] was used to help assure complete evacuation. There was minimal bleeding noted and the tenaculum removed with good hemostasis noted.   All instruments were removed from the patient's vagina.  Sponge and instrument counts were correct times two  The patient tolerated the procedure well and was taken to the recovery area awake, and in stable condition.  Adam Phenix, MD 12/27/2019 4:04 PM

## 2019-12-28 ENCOUNTER — Encounter (HOSPITAL_COMMUNITY): Payer: Self-pay | Admitting: Obstetrics & Gynecology

## 2019-12-29 ENCOUNTER — Other Ambulatory Visit (HOSPITAL_COMMUNITY): Payer: BC Managed Care – PPO

## 2019-12-29 LAB — SURGICAL PATHOLOGY

## 2020-01-03 ENCOUNTER — Other Ambulatory Visit: Payer: BC Managed Care – PPO

## 2020-01-03 ENCOUNTER — Other Ambulatory Visit: Payer: Self-pay

## 2020-01-03 DIAGNOSIS — O020 Blighted ovum and nonhydatidiform mole: Secondary | ICD-10-CM

## 2020-01-03 NOTE — Progress Notes (Signed)
beta

## 2020-01-04 LAB — BETA HCG QUANT (REF LAB): hCG Quant: 3526 m[IU]/mL

## 2020-01-11 ENCOUNTER — Ambulatory Visit: Payer: BC Managed Care – PPO | Admitting: Obstetrics and Gynecology

## 2020-01-11 ENCOUNTER — Ambulatory Visit (INDEPENDENT_AMBULATORY_CARE_PROVIDER_SITE_OTHER): Payer: BC Managed Care – PPO | Admitting: Obstetrics and Gynecology

## 2020-01-11 ENCOUNTER — Other Ambulatory Visit: Payer: Self-pay

## 2020-01-11 ENCOUNTER — Encounter: Payer: Self-pay | Admitting: Obstetrics and Gynecology

## 2020-01-11 VITALS — BP 110/67 | HR 81 | Ht 63.0 in | Wt 138.4 lb

## 2020-01-11 DIAGNOSIS — Z09 Encounter for follow-up examination after completed treatment for conditions other than malignant neoplasm: Secondary | ICD-10-CM

## 2020-01-11 DIAGNOSIS — O020 Blighted ovum and nonhydatidiform mole: Secondary | ICD-10-CM | POA: Insufficient documentation

## 2020-01-11 HISTORY — DX: Blighted ovum and nonhydatidiform mole: O02.0

## 2020-01-11 NOTE — Progress Notes (Signed)
Pt states that she stopped bleeding 2 days ago.

## 2020-01-11 NOTE — Progress Notes (Signed)
  Obstetrics and Gynecology Visit Return Patient Evaluation  Appointment Date: 01/12/2020  Primary Care Provider: Selinda Flavin  [facility] Clinic: [facility]  Chief Complaint: follow up beta hcg after d&c for complete molar pregnancy  History of Present Illness:  Zoe Armstrong is a 29 y.o. with above CC 12/21/2019: Beta hcg 133,540 12/27/2019: Ultrasound guided suction d&c by Dr. Debroah Loop, no beta hcg drawn-->complete mole on pathology 01/03/2020: Beta hcg 3,526  Interval History: Since that time, she states that she is doing well. VB stopped earlier this week  Review of Systems: as noted in the History of Present Illness.  Patient Active Problem List   Diagnosis Date Noted  . 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 Encounter for gynecological examination with Papanicolaou smear of cervix 07/18/2019  . Fourth degree perineal laceration 03/11/2019  . History of pyelonephritis 03/09/2019  . Recurrent UTI 07/19/2018  . Anxiety 07/25/2014   Medications:  None  Allergies: has No Known Allergies.  Physical Exam:  BP 110/67   Pulse 81   Ht 5\' 3"  (1.6 m)   Wt 138 lb 6.4 oz (62.8 kg)   LMP  (LMP Unknown) Comment: hadn't really had one due to breast freeding, started bleeding 6/2  Breastfeeding Unknown Comment: Molar Pregnancy  BMI 24.52 kg/m  Body mass index is 24.52 kg/m. General appearance: Well nourished, well developed female in no acute distress.  Neuro/Psych:  Normal mood and affect.     Assessment: pt doing well  Plan:  1. Complete molar pregnancy D/w her re: molar pregnancy and 10-15% cancer risk and need for qwk beta follow ups until negative for 3 weeks and then qmonth beta follow up for 3-6 months. I d/w her need to avoid pregnancy until follow up is done which should take about 6 months. Pt declines BC at this time. Will get non stat beta hcg today - Beta hCG quant (ref lab) - Beta hCG quant (ref lab); Standing   RTC: weekly for  next several weeks for non stat hcg lab only visits.   8/2 MD Attending [facility] [employer])

## 2020-01-12 LAB — BETA HCG QUANT (REF LAB): hCG Quant: 498 m[IU]/mL

## 2020-01-18 ENCOUNTER — Other Ambulatory Visit: Payer: Self-pay

## 2020-01-18 ENCOUNTER — Other Ambulatory Visit: Payer: BC Managed Care – PPO

## 2020-01-18 DIAGNOSIS — O020 Blighted ovum and nonhydatidiform mole: Secondary | ICD-10-CM

## 2020-01-19 LAB — BETA HCG QUANT (REF LAB): hCG Quant: 144 m[IU]/mL

## 2020-01-25 ENCOUNTER — Other Ambulatory Visit: Payer: BC Managed Care – PPO

## 2020-01-25 ENCOUNTER — Other Ambulatory Visit: Payer: Self-pay | Admitting: General Practice

## 2020-01-25 ENCOUNTER — Other Ambulatory Visit: Payer: Self-pay

## 2020-01-25 DIAGNOSIS — O020 Blighted ovum and nonhydatidiform mole: Secondary | ICD-10-CM

## 2020-01-26 LAB — BETA HCG QUANT (REF LAB): hCG Quant: 56 m[IU]/mL

## 2020-02-01 ENCOUNTER — Other Ambulatory Visit: Payer: BC Managed Care – PPO

## 2020-02-08 ENCOUNTER — Other Ambulatory Visit: Payer: Self-pay

## 2020-02-08 ENCOUNTER — Other Ambulatory Visit: Payer: Self-pay | Admitting: Obstetrics and Gynecology

## 2020-02-08 ENCOUNTER — Ambulatory Visit (INDEPENDENT_AMBULATORY_CARE_PROVIDER_SITE_OTHER): Payer: BC Managed Care – PPO

## 2020-02-08 ENCOUNTER — Other Ambulatory Visit: Payer: BC Managed Care – PPO

## 2020-02-08 ENCOUNTER — Other Ambulatory Visit: Payer: Self-pay | Admitting: *Deleted

## 2020-02-08 DIAGNOSIS — R3915 Urgency of urination: Secondary | ICD-10-CM

## 2020-02-08 DIAGNOSIS — O020 Blighted ovum and nonhydatidiform mole: Secondary | ICD-10-CM

## 2020-02-08 DIAGNOSIS — R350 Frequency of micturition: Secondary | ICD-10-CM

## 2020-02-08 DIAGNOSIS — R319 Hematuria, unspecified: Secondary | ICD-10-CM | POA: Diagnosis not present

## 2020-02-08 LAB — POCT URINALYSIS DIP (DEVICE)
Bilirubin Urine: NEGATIVE
Glucose, UA: NEGATIVE mg/dL
Ketones, ur: NEGATIVE mg/dL
Leukocytes,Ua: NEGATIVE
Nitrite: NEGATIVE
Protein, ur: NEGATIVE mg/dL
Specific Gravity, Urine: 1.025 (ref 1.005–1.030)
Urobilinogen, UA: 0.2 mg/dL (ref 0.0–1.0)
pH: 6.5 (ref 5.0–8.0)

## 2020-02-08 NOTE — Progress Notes (Signed)
Pt here today for possible UTI and non-stat beta lab draw to follow up resolved molar pregnancy. Pt reports blood in urine, frequency and urgency beginning Friday, 02/02/20. States burning and bleeding have resolved with otc Azo. Pt reports hx of chronic UTI during pregnancy in 2020. However, since delivery September 2020 this has improved. Followed by [facility].   UA not suspicious for UTI; trace blood in urine, no other abnormal results. Reviewed with Gerrit Heck, CNM who recommends urine be sent for culture and patient instructed to follow up with urology. Pt given provider recommendations and agrees to follow up with urologist. Encouraged pt to follow up with our office as needed in the future.   Fleet Contras RN 02/08/20

## 2020-02-08 NOTE — Progress Notes (Signed)
Patient was assessed and managed by nursing staff during this encounter. I have reviewed the chart and agree with the documentation and plan. I have also made any necessary editorial changes.  Cherre Robins, CNM 02/08/2020 1:32 PM

## 2020-02-08 NOTE — Addendum Note (Signed)
Addended by: Maxwell Marion E on: 02/08/2020 01:14 PM   Modules accepted: Level of Service

## 2020-02-09 LAB — URINE CULTURE

## 2020-02-09 LAB — BETA HCG QUANT (REF LAB): hCG Quant: 15 m[IU]/mL

## 2020-02-14 ENCOUNTER — Other Ambulatory Visit: Payer: Self-pay | Admitting: *Deleted

## 2020-02-14 DIAGNOSIS — O020 Blighted ovum and nonhydatidiform mole: Secondary | ICD-10-CM

## 2020-02-16 ENCOUNTER — Other Ambulatory Visit: Payer: BC Managed Care – PPO

## 2020-02-16 ENCOUNTER — Other Ambulatory Visit: Payer: Self-pay

## 2020-02-16 DIAGNOSIS — O020 Blighted ovum and nonhydatidiform mole: Secondary | ICD-10-CM

## 2020-02-17 LAB — BETA HCG QUANT (REF LAB): hCG Quant: 8 m[IU]/mL

## 2020-02-21 ENCOUNTER — Other Ambulatory Visit: Payer: Self-pay | Admitting: *Deleted

## 2020-02-21 DIAGNOSIS — O020 Blighted ovum and nonhydatidiform mole: Secondary | ICD-10-CM

## 2020-02-22 ENCOUNTER — Other Ambulatory Visit: Payer: Self-pay

## 2020-02-22 ENCOUNTER — Other Ambulatory Visit: Payer: BC Managed Care – PPO

## 2020-02-22 DIAGNOSIS — O020 Blighted ovum and nonhydatidiform mole: Secondary | ICD-10-CM

## 2020-02-23 LAB — BETA HCG QUANT (REF LAB): hCG Quant: 5 m[IU]/mL

## 2020-02-29 ENCOUNTER — Other Ambulatory Visit: Payer: Self-pay

## 2020-02-29 ENCOUNTER — Other Ambulatory Visit: Payer: BC Managed Care – PPO

## 2020-02-29 DIAGNOSIS — O020 Blighted ovum and nonhydatidiform mole: Secondary | ICD-10-CM

## 2020-03-01 LAB — BETA HCG QUANT (REF LAB): hCG Quant: 3 m[IU]/mL

## 2020-03-07 ENCOUNTER — Other Ambulatory Visit: Payer: BC Managed Care – PPO

## 2020-03-07 ENCOUNTER — Other Ambulatory Visit: Payer: Self-pay

## 2020-03-07 DIAGNOSIS — O020 Blighted ovum and nonhydatidiform mole: Secondary | ICD-10-CM

## 2020-03-08 LAB — BETA HCG QUANT (REF LAB): hCG Quant: 2 m[IU]/mL

## 2020-03-14 ENCOUNTER — Other Ambulatory Visit: Payer: BC Managed Care – PPO

## 2020-03-14 ENCOUNTER — Other Ambulatory Visit: Payer: Self-pay

## 2020-03-14 DIAGNOSIS — O020 Blighted ovum and nonhydatidiform mole: Secondary | ICD-10-CM

## 2020-03-14 NOTE — Progress Notes (Signed)
ber

## 2020-03-21 ENCOUNTER — Other Ambulatory Visit: Payer: Self-pay

## 2020-03-21 ENCOUNTER — Other Ambulatory Visit: Payer: BC Managed Care – PPO

## 2020-03-21 DIAGNOSIS — O020 Blighted ovum and nonhydatidiform mole: Secondary | ICD-10-CM

## 2020-03-22 LAB — BETA HCG QUANT (REF LAB): hCG Quant: <1 m[IU]/mL

## 2020-03-23 ENCOUNTER — Other Ambulatory Visit: Payer: BC Managed Care – PPO

## 2020-04-12 ENCOUNTER — Other Ambulatory Visit: Payer: BC Managed Care – PPO

## 2020-04-22 ENCOUNTER — Other Ambulatory Visit: Payer: BC Managed Care – PPO

## 2020-04-22 ENCOUNTER — Other Ambulatory Visit: Payer: Self-pay

## 2020-04-22 ENCOUNTER — Other Ambulatory Visit: Payer: Self-pay | Admitting: General Practice

## 2020-04-22 DIAGNOSIS — O020 Blighted ovum and nonhydatidiform mole: Secondary | ICD-10-CM

## 2020-04-23 LAB — BETA HCG QUANT (REF LAB): hCG Quant: <1 m[IU]/mL

## 2020-05-21 ENCOUNTER — Other Ambulatory Visit: Payer: Self-pay | Admitting: *Deleted

## 2020-05-21 DIAGNOSIS — O020 Blighted ovum and nonhydatidiform mole: Secondary | ICD-10-CM

## 2020-05-22 ENCOUNTER — Other Ambulatory Visit: Payer: BC Managed Care – PPO

## 2020-05-22 ENCOUNTER — Other Ambulatory Visit: Payer: Self-pay

## 2020-05-22 DIAGNOSIS — O020 Blighted ovum and nonhydatidiform mole: Secondary | ICD-10-CM

## 2020-05-23 LAB — BETA HCG QUANT (REF LAB): hCG Quant: <1 m[IU]/mL

## 2020-05-29 ENCOUNTER — Encounter: Payer: Self-pay | Admitting: Obstetrics and Gynecology

## 2020-06-18 ENCOUNTER — Other Ambulatory Visit: Payer: Self-pay | Admitting: *Deleted

## 2020-06-18 DIAGNOSIS — O020 Blighted ovum and nonhydatidiform mole: Secondary | ICD-10-CM

## 2020-06-24 ENCOUNTER — Other Ambulatory Visit: Payer: BC Managed Care – PPO

## 2021-02-12 ENCOUNTER — Other Ambulatory Visit: Payer: Self-pay | Admitting: Obstetrics & Gynecology

## 2021-08-14 IMAGING — US US OB < 14 WEEKS - US OB TV
1 series · 15 of 28 positions shown · non-contrast
Comparison: None.

CLINICAL DATA: 29-year-old female with vaginal bleeding in the 1st
trimester of pregnancy. Quantitative beta HCG 4 days ago was
[DATE].

EXAM:
OBSTETRIC <14 WK US AND TRANSVAGINAL OB US
TECHNIQUE: Both transabdominal and transvaginal ultrasound examinations were
performed for complete evaluation of the gestation as well as the
maternal uterus, adnexal regions, and pelvic cul-de-sac.
Transvaginal technique was performed to assess early pregnancy.

[Series 1: us ob < 14 weeks - us ob tv · 15 of 56 slices shown]
[im 1/56]
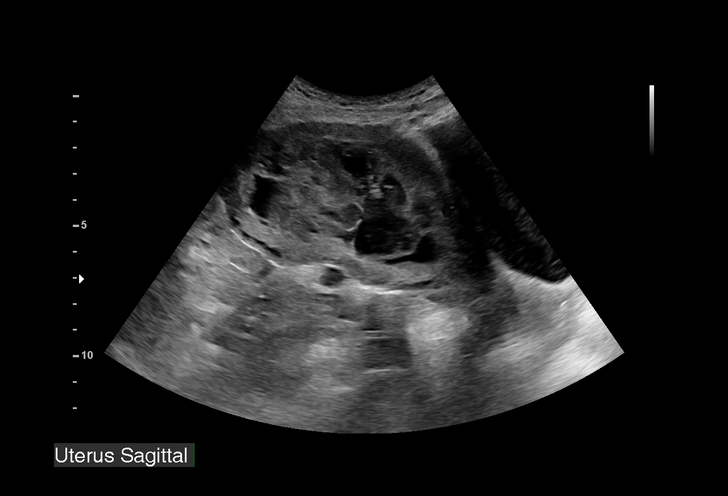
[im 5/56]
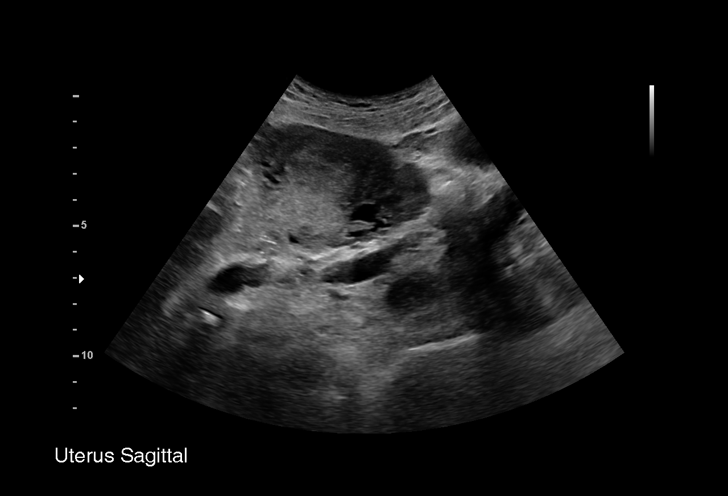
[im 9/56]
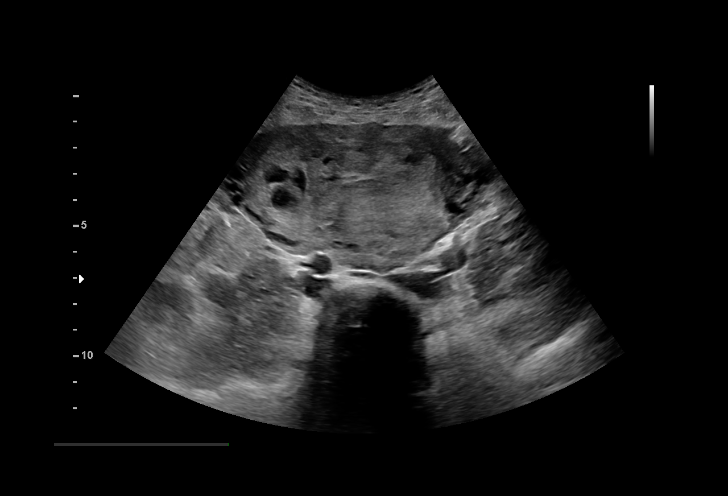
[im 13/56]
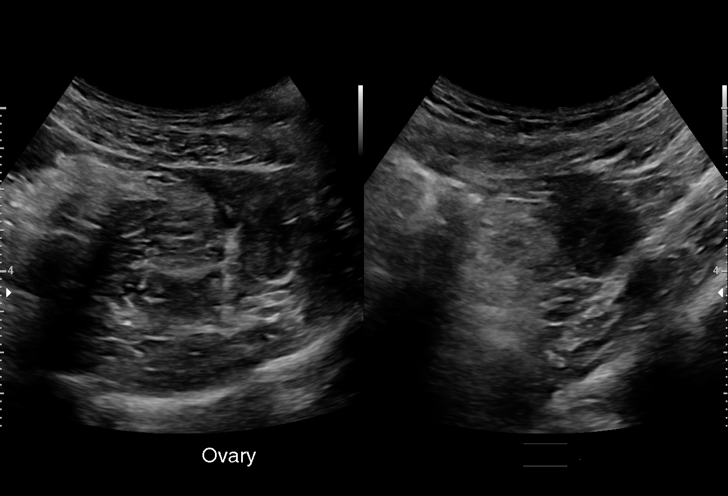
[im 17/56]
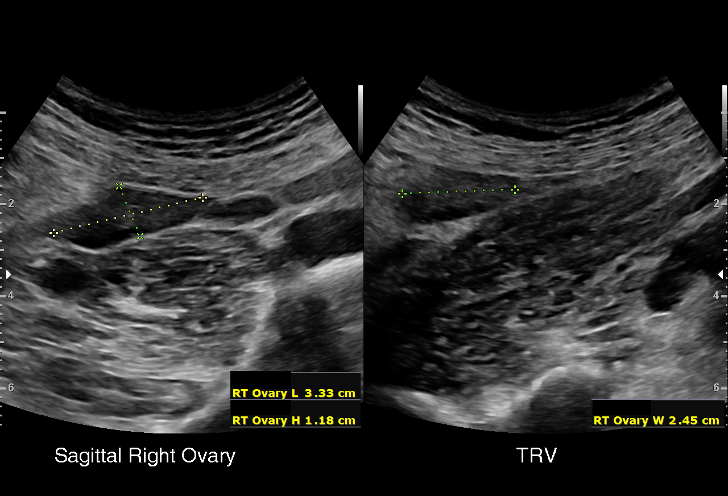
[im 21/56]
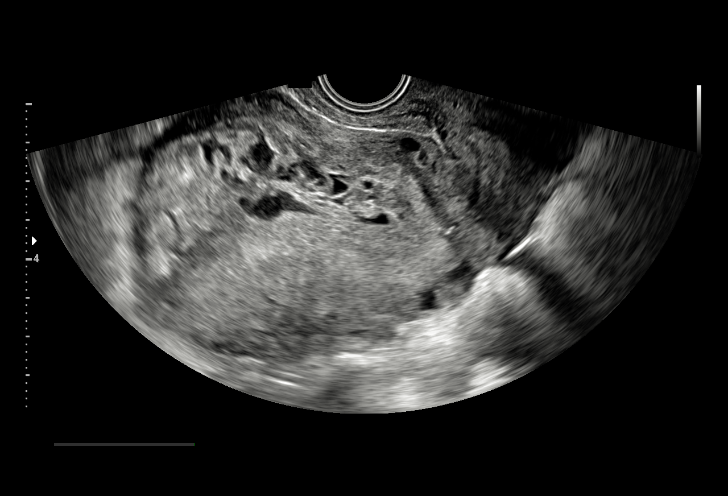
[im 25/56]
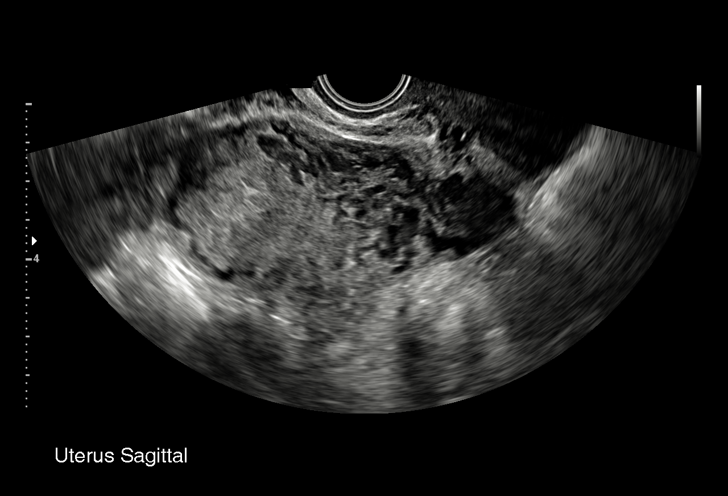
[im 29/56]
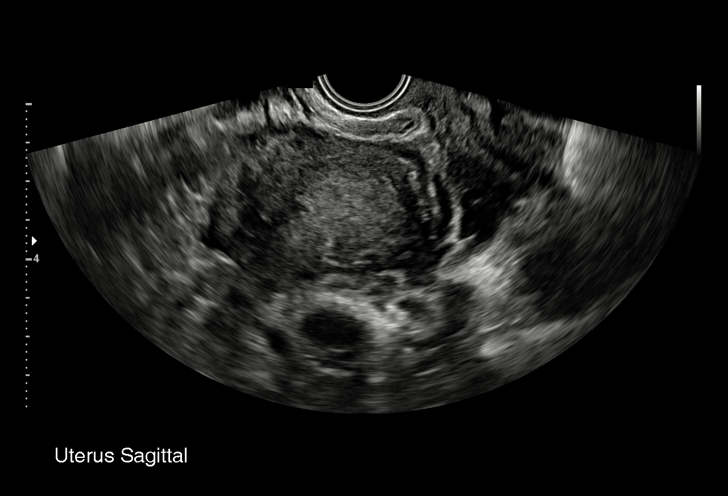
[im 31/56]
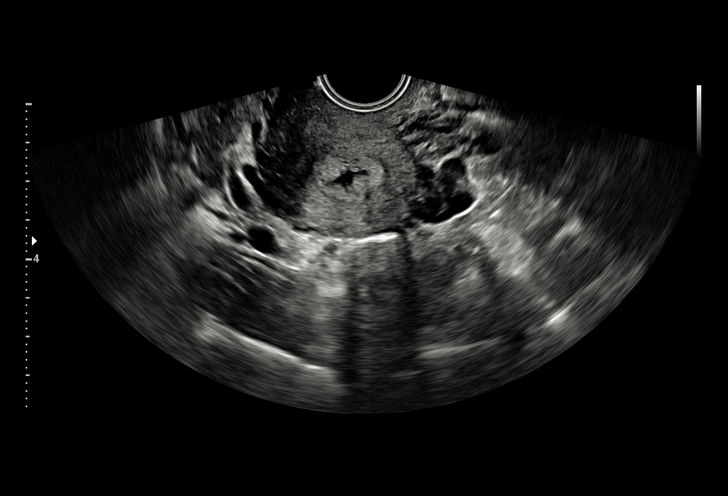
[im 35/56]
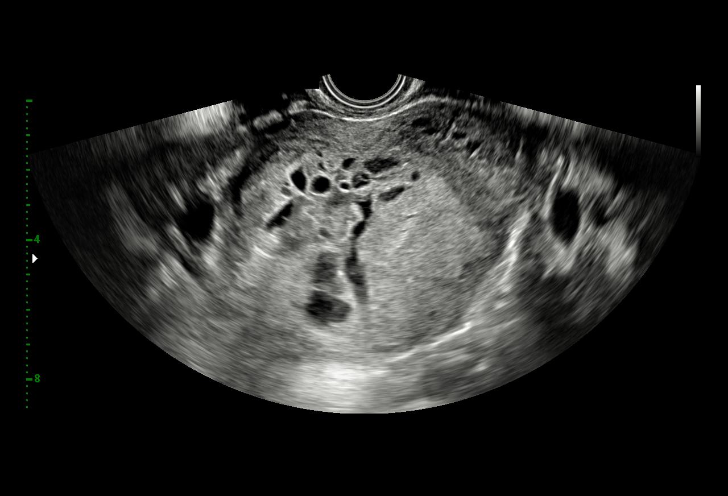
[im 39/56]
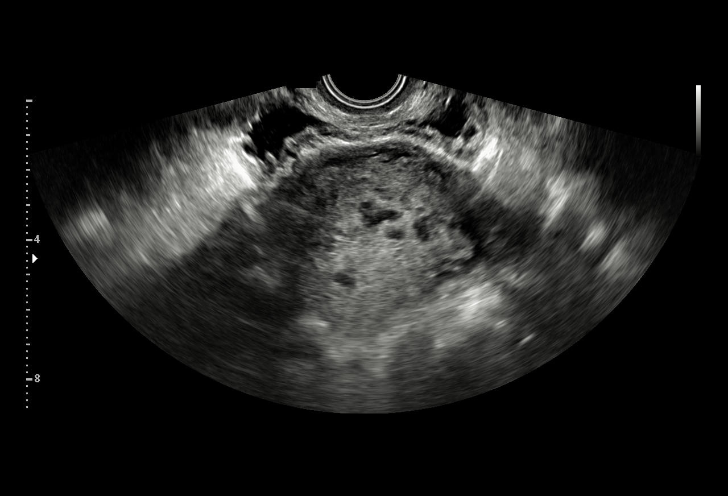
[im 43/56]
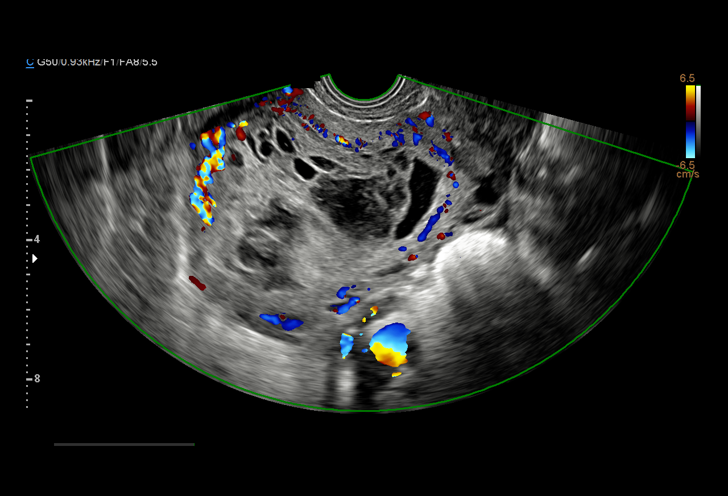
[im 47/56]
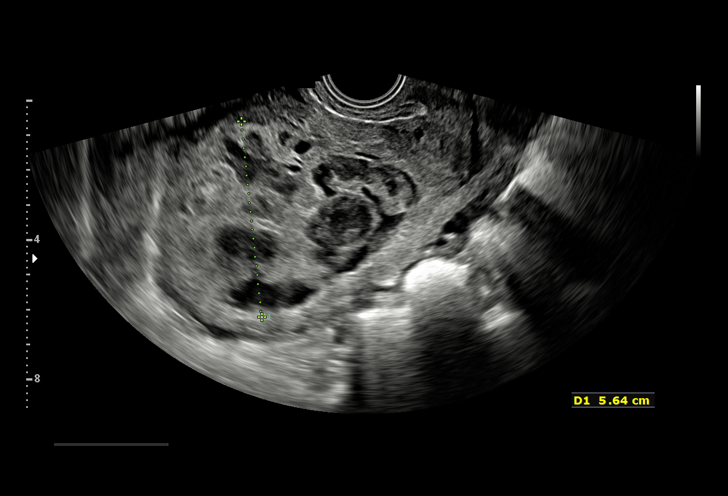
[im 51/56]
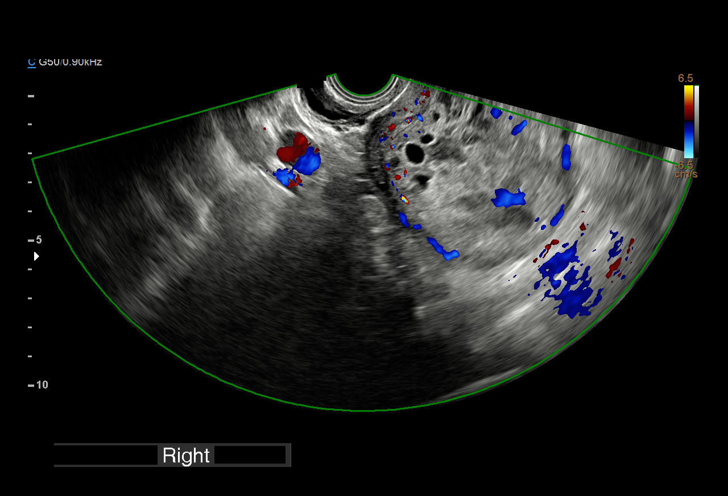
[im 56/56]
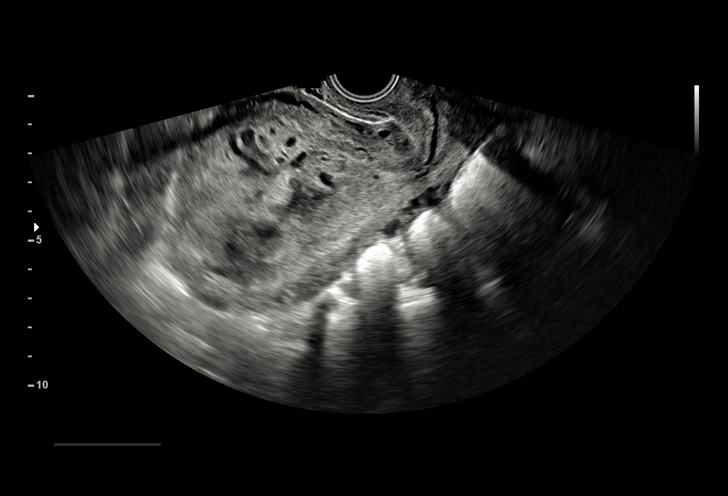

[15 of 28 positions shown; findings below may reference images not displayed]

FINDINGS: Intrauterine gestational sac: No normal gestational sac identified.

Maternal uterus/adnexae: There is a complex multi cystic appearing
mass within the endometrial canal (image 13) with only some internal
vascular elements identified (image 45). No recognizable fetal
parts.

No associated pelvic free fluid.

Both ovaries seem to remain normal, the right is better visualized
with transabdominal technique. The left ovary is 3.2 x 1.6 x 1.9 cm.
The right ovary is 3.3 x 1.2 x 2.5 cm.
IMPRESSION: No normal gestational sac and no recognizable fetus.
Complex multi-cystic appearing mass within the uterus is suspicious
for gestational trophoblastic disease, such as complete Hydatidiform
mole. Recommend GYN consultation.

## 2022-04-06 LAB — OB RESULTS CONSOLE HEPATITIS B SURFACE ANTIGEN: Hepatitis B Surface Ag: NEGATIVE

## 2022-04-06 LAB — OB RESULTS CONSOLE ABO/RH: RH Type: POSITIVE

## 2022-04-06 LAB — OB RESULTS CONSOLE ANTIBODY SCREEN: Antibody Screen: NEGATIVE

## 2022-04-06 LAB — OB RESULTS CONSOLE GC/CHLAMYDIA
Chlamydia: NEGATIVE
Neisseria Gonorrhea: NEGATIVE
Neisseria Gonorrhea: NEGATIVE
Neisseria Gonorrhea: NEGATIVE

## 2022-04-06 LAB — OB RESULTS CONSOLE HIV ANTIBODY (ROUTINE TESTING): HIV: NONREACTIVE

## 2022-04-06 LAB — HEPATITIS C ANTIBODY: HCV Ab: NEGATIVE

## 2022-04-06 LAB — OB RESULTS CONSOLE RUBELLA ANTIBODY, IGM: Rubella: IMMUNE

## 2022-07-16 ENCOUNTER — Other Ambulatory Visit: Payer: Self-pay

## 2022-07-16 ENCOUNTER — Inpatient Hospital Stay (HOSPITAL_COMMUNITY)
Admission: AD | Admit: 2022-07-16 | Discharge: 2022-07-16 | Disposition: A | Discharge status: Home / Self Care | Payer: BC Managed Care – PPO | Attending: Obstetrics and Gynecology | Admitting: Obstetrics and Gynecology

## 2022-07-16 ENCOUNTER — Inpatient Hospital Stay (HOSPITAL_BASED_OUTPATIENT_CLINIC_OR_DEPARTMENT_OTHER): Payer: BC Managed Care – PPO

## 2022-07-16 ENCOUNTER — Encounter (HOSPITAL_COMMUNITY): Payer: Self-pay | Admitting: *Deleted

## 2022-07-16 DIAGNOSIS — N898 Other specified noninflammatory disorders of vagina: Secondary | ICD-10-CM

## 2022-07-16 DIAGNOSIS — Z3A24 24 weeks gestation of pregnancy: Secondary | ICD-10-CM

## 2022-07-16 DIAGNOSIS — O23592 Infection of other part of genital tract in pregnancy, second trimester: Secondary | ICD-10-CM | POA: Insufficient documentation

## 2022-07-16 DIAGNOSIS — O99891 Other specified diseases and conditions complicating pregnancy: Secondary | ICD-10-CM | POA: Diagnosis not present

## 2022-07-16 DIAGNOSIS — O26892 Other specified pregnancy related conditions, second trimester: Secondary | ICD-10-CM

## 2022-07-16 DIAGNOSIS — B9689 Other specified bacterial agents as the cause of diseases classified elsewhere: Secondary | ICD-10-CM | POA: Insufficient documentation

## 2022-07-16 DIAGNOSIS — Z3492 Encounter for supervision of normal pregnancy, unspecified, second trimester: Secondary | ICD-10-CM

## 2022-07-16 LAB — WET PREP, GENITAL
Clue Cells Wet Prep HPF POC: NONE SEEN
Sperm: NONE SEEN
Trich, Wet Prep: NONE SEEN
WBC, Wet Prep HPF POC: <10 (ref <10)
Yeast Wet Prep HPF POC: NONE SEEN

## 2022-07-16 LAB — POCT FERN TEST: POCT Fern Test: NEGATIVE

## 2022-07-16 LAB — AMNISURE RUPTURE OF MEMBRANE (ROM) NOT AT ARMC: Amnisure ROM: NEGATIVE

## 2022-07-16 MED ORDER — METRONIDAZOLE 500 MG PO TABS: 500.0000 mg | ORAL_TABLET | Freq: Two times a day (BID) | ORAL | 0 refills | Status: DC

## 2022-07-16 NOTE — MAU Provider Note (Signed)
History     CSN: [id number]  Arrival date and time: 07/16/22 0258   Event Date/Time   First Provider Initiated Contact with Patient 07/16/22 [phone]      Chief Complaint  Patient presents with   Rupture of Membranes   HPI Zoe Armstrong is a 32 y.o. G3P1011 at [redacted]w[redacted]d who presents to MAU with chief complaint of leaking of fluid. She endorses feeling a gush around 0530 this morning. She and her husband subsequently visualized fluid running down her legs. She denies contractions, vaginal bleeding, DFM, fever or recent illness. She is remote from sexual intercourse.  Patient is planning primary cesarean for hx of 4th degree. She has been NPO since last night.  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 Roselie Awkward, no beta hcg drawn-->complete mole on pathology 7/7: Beta hcg 3,526 7/15: 498 7/22: 144 7/29: 56 8/12: 15 8/20: 8 8/26: 5 9/2: 3 9/9: 2 9/23: <1 10/25: <1 11/24: <1   Encounter for menstrual regulation 12/24/2015   Fatigue 12/24/2015   Headache    migraines - 12/26/19- "have not had one in a while"   History of kidney stones    passed   Menstrual extraction 04/10/2013   Weight gain 12/24/2015    Past Surgical History:  Procedure Laterality Date   DILATION AND EVACUATION N/A 12/27/2019   Procedure: DILATATION AND EVACUATION;  Surgeon: Woodroe Mode, MD;  Location: [facility];  Service: Gynecology;  Laterality: N/A;   NO PAST SURGERIES     OPERATIVE ULTRASOUND N/A 12/27/2019   Procedure: OPERATIVE ULTRASOUND;  Surgeon: Woodroe Mode, MD;  Location: [place];  Service: Gynecology;  Laterality: N/A;    Family History  Problem Relation Age of Onset   Cancer Mother        breast    Thyroid disease Mother    Cancer Maternal Uncle        lung that spread everywhere   Diabetes Paternal Aunt    Heart attack Paternal Aunt    Cancer Maternal Grandfather    Heart disease Paternal Grandfather    Schizophrenia Paternal Grandmother    Heart attack Paternal Grandmother    Alzheimer's disease Maternal Grandmother    Thyroid disease Maternal Aunt     Social History   Tobacco Use   Smoking status: Never   Smokeless tobacco: Never  Vaping Use   Vaping Use: Never used  Substance Use Topics   Alcohol use: No   Drug use: No    Allergies: No Known Allergies  Medications Prior to Admission  Medication Sig Dispense Refill Last Dose   Prenatal Vit-Fe Fumarate-FA (PRENATAL MULTIVITAMIN) TABS tablet Take 1 tablet by mouth daily at 12 noon.   07/15/2022   Ferrous Fumarate-Folic Acid 824-2 MG  TABS Take 1 tablet by mouth AC breakfast. (Patient not taking: Reported on 02/08/2020) 30 tablet 1    ibuprofen (ADVIL) 600 MG tablet Take 1 tablet (600 mg total) by mouth every 6 (six) hours as needed. (Patient not taking: Reported on 02/08/2020) 30 tablet 1    oxyCODONE (OXY IR/ROXICODONE) 5 MG immediate release tablet Take 1 tablet (5 mg total) by mouth every 4 (four) hours as needed for severe pain. (Patient not taking: Reported on 02/08/2020) 12 tablet 0     Review of Systems  Genitourinary:  Positive for vaginal discharge.  All other systems reviewed and are negative.  Physical Exam   Blood pressure 110/61, pulse 76, temperature 97.9 F (36.6 C), temperature source Oral, resp. rate 19, height 5\' 3"  (1.6 m), weight 74.3 kg, SpO2 100 %, unknown if currently breastfeeding.  Physical Exam Vitals and nursing note reviewed. Exam conducted with a chaperone present.  Constitutional:      Appearance: Normal appearance.  Cardiovascular:     Rate and Rhythm:  Normal rate.     Pulses: Normal pulses.  Pulmonary:     Effort: Pulmonary effort is normal.  Abdominal:     Comments: Gravid  Genitourinary:    Comments: Pelvic exam: External genitalia normal, vaginal walls pink and well rugated, cervix visually closed, no lesions noted. Thin whitish-blue discharge visible in posterior vault on sterile speculum exam. Unable to assess for foul odor due to mask.  No increase in volume with Valsalva.    Skin:    Capillary Refill: Capillary refill takes less than 2 seconds.  Neurological:     Mental Status: She is alert and oriented to person, place, and time.  Psychiatric:        Mood and Affect: Mood normal.        Behavior: Behavior normal.        Thought Content: Thought content normal.        Judgment: Judgment normal.     MAU Course  Procedures  MDM  --Reactive tracing: baseline 150, mod var, + accels, brief decels appropriate for gestational age --Toco: quiet  Orders Placed This Encounter  Procedures   Wet prep, genital   Amnisure rupture of membrane (rom)not at [place]   Patient Vitals for the past 24 hrs:  BP Temp Temp src Pulse Resp SpO2 Height Weight  07/16/22 0915 (!) 110/54 -- -- 80 -- -- -- --  07/16/22 0731 110/61 -- -- 76 -- -- -- --  07/16/22 0714 103/64 97.9 F (36.6 C) Oral 77 19 100 % -- --  07/16/22 0709 -- -- -- -- -- -- 5\' 3"  (1.6 m) 74.3 kg   Results for orders placed or performed during the hospital encounter of 07/16/22 (from the past 24 hour(s))  Amnisure rupture of membrane (rom)not at [facility]     Status: None   Collection Time: 07/16/22  7:58 AM  Result Value Ref Range   Amnisure ROM NEGATIVE   Wet prep, genital     Status: None   Collection Time: 07/16/22  7:58 AM   Specimen: Vaginal  Result Value Ref Range   Yeast Wet Prep HPF POC NONE SEEN NONE SEEN   Trich, Wet Prep NONE SEEN NONE SEEN   Clue Cells Wet Prep HPF POC NONE SEEN NONE SEEN   WBC, Wet Prep HPF POC <10 <10   Sperm NONE SEEN   Fern  Test     Status: None   Collection Time: 07/16/22  8:10 AM  Result Value Ref Range   POCT Fern Test Negative = intact amniotic membranes    Meds ordered this encounter  Medications   metroNIDAZOLE (FLAGYL) 500 MG tablet    Sig: Take 1 tablet (500 mg total) by mouth 2 (two) times daily.    Dispense:  14 tablet    Refill:  0    Order Specific Question:   Supervising Provider    Answer:   Adam Phenix [[id number]]    Assessment and Plan  --33 y.o. G3P1011 at [redacted]w[redacted]d  --Reactive tracing --Closed cervix --Intact amniotic sac (neg fern, neg amnisure) --No concerning findings on formal MFM ultrasound --Treat for Bacterial Vaginosis based on physical exam --Discharge home in stable condition  Calvert Cantor, MSA, MSN, CNM 07/16/2022, 9:44 AM

## 2022-07-16 NOTE — MAU Note (Signed)
Zoe Armstrong is a 32 y.o. at Unknown here in MAU reporting: she had a gush of fluid this morning @ 0540 this morning, states fluid is clear.  Denies VB .  Reports +FM. LMP: NA Onset of complaint: today Pain score: 0 Vitals:   07/16/22 0714  BP: 103/64  Pulse: 77  Resp: 19  Temp: 97.9 F (36.6 C)  SpO2: 100%     FHT:152 bpm Lab orders placed from triage:   None

## 2022-07-16 NOTE — Discharge Instructions (Signed)

## 2022-07-23 ENCOUNTER — Inpatient Hospital Stay (HOSPITAL_COMMUNITY)
Admission: AD | Admit: 2022-07-23 | Discharge: 2022-07-23 | Disposition: A | Discharge status: Home / Self Care | Payer: BC Managed Care – PPO | Attending: Obstetrics and Gynecology | Admitting: Obstetrics and Gynecology

## 2022-07-23 ENCOUNTER — Inpatient Hospital Stay (HOSPITAL_COMMUNITY): Payer: BC Managed Care – PPO

## 2022-07-23 ENCOUNTER — Encounter (HOSPITAL_COMMUNITY): Payer: Self-pay | Admitting: Obstetrics and Gynecology

## 2022-07-23 DIAGNOSIS — R109 Unspecified abdominal pain: Secondary | ICD-10-CM

## 2022-07-23 DIAGNOSIS — N132 Hydronephrosis with renal and ureteral calculous obstruction: Secondary | ICD-10-CM | POA: Diagnosis not present

## 2022-07-23 DIAGNOSIS — Z87442 Personal history of urinary calculi: Secondary | ICD-10-CM | POA: Insufficient documentation

## 2022-07-23 DIAGNOSIS — Z3A25 25 weeks gestation of pregnancy: Secondary | ICD-10-CM | POA: Insufficient documentation

## 2022-07-23 DIAGNOSIS — O26892 Other specified pregnancy related conditions, second trimester: Secondary | ICD-10-CM | POA: Insufficient documentation

## 2022-07-23 DIAGNOSIS — M549 Dorsalgia, unspecified: Secondary | ICD-10-CM | POA: Diagnosis not present

## 2022-07-23 DIAGNOSIS — O26899 Other specified pregnancy related conditions, unspecified trimester: Secondary | ICD-10-CM

## 2022-07-23 DIAGNOSIS — N200 Calculus of kidney: Secondary | ICD-10-CM

## 2022-07-23 LAB — URINALYSIS, ROUTINE W REFLEX MICROSCOPIC
Bilirubin Urine: NEGATIVE
Glucose, UA: NEGATIVE mg/dL
Hgb urine dipstick: NEGATIVE
Ketones, ur: NEGATIVE mg/dL
Leukocytes,Ua: NEGATIVE
Nitrite: NEGATIVE
Protein, ur: NEGATIVE mg/dL
Specific Gravity, Urine: 1.006 (ref 1.005–1.030)
pH: 7 (ref 5.0–8.0)

## 2022-07-23 LAB — COMPREHENSIVE METABOLIC PANEL
ALT: 11 U/L (ref 0–44)
AST: 14 U/L — ABNORMAL LOW (ref 15–41)
Albumin: 3.2 g/dL — ABNORMAL LOW (ref 3.5–5.0)
Alkaline Phosphatase: 40 U/L (ref 38–126)
Anion gap: 10 (ref 5–15)
BUN: <5 mg/dL — ABNORMAL LOW (ref 6–20)
CO2: 19 mmol/L — ABNORMAL LOW (ref 22–32)
Calcium: 9.1 mg/dL (ref 8.9–10.3)
Chloride: 106 mmol/L (ref 98–111)
Creatinine, Ser: 0.57 mg/dL (ref 0.44–1.00)
GFR, Estimated: >60 mL/min (ref >60)
Glucose, Bld: 88 mg/dL (ref 70–99)
Potassium: 3.9 mmol/L (ref 3.5–5.1)
Sodium: 135 mmol/L (ref 135–145)
Total Bilirubin: 0.4 mg/dL (ref 0.3–1.2)
Total Protein: 5.9 g/dL — ABNORMAL LOW (ref 6.5–8.1)

## 2022-07-23 LAB — CBC
HCT: 34.8 % — ABNORMAL LOW (ref 36.0–46.0)
Hemoglobin: 12.4 g/dL (ref 12.0–15.0)
MCH: 30.8 pg (ref 26.0–34.0)
MCHC: 35.6 g/dL (ref 30.0–36.0)
MCV: 86.6 fL (ref 80.0–100.0)
Platelets: 177 10*3/uL (ref 150–400)
RBC: 4.02 MIL/uL (ref 3.87–5.11)
RDW: 13.6 % (ref 11.5–15.5)
WBC: 8.3 10*3/uL (ref 4.0–10.5)
nRBC: 0 % (ref 0.0–0.2)

## 2022-07-23 MED ORDER — TAMSULOSIN HCL 0.4 MG PO CAPS: 0.4000 mg | ORAL_CAPSULE | Freq: Every day | ORAL | 2 refills | Status: DC

## 2022-07-23 MED ORDER — OXYCODONE HCL 5 MG PO TABS
5.0000 mg | ORAL_TABLET | Freq: Once | ORAL | Status: AC
  Administered 2022-07-23: 5 mg via ORAL
  Filled 2022-07-23: qty 1

## 2022-07-23 MED ORDER — LACTATED RINGERS IV BOLUS
1000.0000 mL | Freq: Once | INTRAVENOUS | Status: AC
  Administered 2022-07-23: 1000 mL via INTRAVENOUS

## 2022-07-23 MED ORDER — FENTANYL CITRATE (PF) 100 MCG/2ML IJ SOLN
50.0000 ug | Freq: Once | INTRAMUSCULAR | Status: AC
  Administered 2022-07-23: 50 ug via INTRAVENOUS
  Filled 2022-07-23: qty 2

## 2022-07-23 MED ORDER — CYCLOBENZAPRINE HCL 10 MG PO TABS: 10.0000 mg | ORAL_TABLET | Freq: Two times a day (BID) | ORAL | 0 refills | Status: DC | PRN

## 2022-07-23 MED ORDER — CYCLOBENZAPRINE HCL 5 MG PO TABS
10.0000 mg | ORAL_TABLET | Freq: Once | ORAL | Status: AC
  Administered 2022-07-23: 10 mg via ORAL
  Filled 2022-07-23: qty 2

## 2022-07-23 MED ORDER — OXYCODONE HCL 5 MG PO CAPS: 5.0000 mg | ORAL_CAPSULE | ORAL | 0 refills | Status: AC | PRN

## 2022-07-23 MED ORDER — PROMETHAZINE HCL 25 MG PO TABS: 25.0000 mg | ORAL_TABLET | Freq: Four times a day (QID) | ORAL | 0 refills | Status: DC | PRN

## 2022-07-23 NOTE — MAU Note (Signed)
.  Zoe Armstrong is a 32 y.o. at [redacted]w[redacted]d here in MAU reporting: started having intermittent left flank pain this past weekend. She states she went to her OB on Monday and they prescribed her bactrim because she had recurrent UTI's in her last pregnancy. Her UA in the office showed blood in her urine but no bacteria. Has had recurrent kidney stones in the past, called her urologist, but they stated she needed to come here since she is pregnant. Denies Vb or LOF. DFM today.   Pain score: 10 Vitals:   07/23/22 0911  BP: 124/61  Pulse: 83  Resp: 14  Temp: 97.6 F (36.4 C)     FHT:146 Lab orders placed from triage:  UA

## 2022-07-23 NOTE — MAU Provider Note (Signed)
History     CSN: [id number]  Arrival date and time: 07/23/22 0854   None     Chief Complaint  Patient presents with   Flank Pain   HPI Zoe Armstrong is a 32 y.o. G3P1011 at [redacted]w[redacted]d who presents to MAU for back pain. Patient reports on Sunday she started having intermittent left sided flank pain. She reports that she felt like there was "something stuck in my urethra". She reports a lot of urgency/frequency. She went to [facility] on Monday and was diagnosed with a UTI and started on Bactrim. She reports pain initially did get better however through out the night, pain continued and she was unable to get comfortable. She reports she has a history of kidney stones and pyelonephritis and these symptoms feel like her previous kidney stones. She denies fever, vomiting, or hematuria, but endorses some nausea. No contractions, vaginal bleeding, or leaking fluid. Endorses positive fetal movement.   Patient receives [facility] at [facility].   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 Roselie Awkward, no beta hcg drawn-->complete mole on pathology 7/7: Beta hcg 3,526 7/15: 498 7/22: 144 7/29: 56 8/12: 15 8/20: 8 8/26: 5 9/2: 3 9/9: 2 9/23: <1 10/25: <1 11/24: <1   Encounter for menstrual regulation 12/24/2015   Fatigue 12/24/2015   Headache    migraines - 12/26/19- "have not had one in a while"   History of kidney stones    passed   Menstrual extraction 04/10/2013   Weight gain 12/24/2015    Past Surgical History:  Procedure Laterality Date   DILATION AND EVACUATION N/A 12/27/2019   Procedure:  DILATATION AND EVACUATION;  Surgeon: Woodroe Mode, MD;  Location: [place];  Service: Gynecology;  Laterality: N/A;   NO PAST SURGERIES     OPERATIVE ULTRASOUND N/A 12/27/2019   Procedure: OPERATIVE ULTRASOUND;  Surgeon: Woodroe Mode, MD;  Location: [facility] [place];  Service: Gynecology;  Laterality: N/A;    Family History  Problem Relation Age of Onset   Cancer Mother        breast    Thyroid disease Mother    Cancer Maternal Uncle        lung that spread everywhere   Diabetes Paternal Aunt    Heart attack Paternal Aunt    Cancer Maternal Grandfather    Heart disease Paternal Grandfather    Schizophrenia Paternal Grandmother    Heart attack Paternal Grandmother    Alzheimer's disease Maternal Grandmother    Thyroid disease Maternal Aunt     Social History   Tobacco Use   Smoking status: Never   Smokeless tobacco: Never  Vaping Use   Vaping Use: Never used  Substance Use Topics   Alcohol use: No   Drug use: No  Allergies: No Known Allergies  Medications Prior to Admission  Medication Sig Dispense Refill Last Dose   Prenatal Vit-Fe Fumarate-FA (PRENATAL MULTIVITAMIN) TABS tablet Take 1 tablet by mouth daily at 12 noon.   07/22/2022   sulfamethoxazole-trimethoprim (BACTRIM DS) 800-160 MG tablet Take 1 tablet by mouth 2 (two) times daily.   07/23/2022   metroNIDAZOLE (FLAGYL) 500 MG tablet Take 1 tablet (500 mg total) by mouth 2 (two) times daily. 14 tablet 0    Review of Systems  Constitutional: Negative.   Respiratory: Negative.    Cardiovascular: Negative.   Gastrointestinal:  Positive for nausea.  Genitourinary:  Positive for flank pain, frequency and urgency.  Neurological: Negative.    Physical Exam   Blood pressure (!) 103/59, pulse 82, temperature 97.8 F (36.6 C), temperature source Oral, resp. rate 14, SpO2 100 %, unknown if currently breastfeeding.  Physical Exam Vitals and nursing note reviewed.  Constitutional:      General: She is not in acute  distress. Eyes:     Extraocular Movements: Extraocular movements intact.     Pupils: Pupils are equal, round, and reactive to light.  Cardiovascular:     Rate and Rhythm: Normal rate.  Pulmonary:     Effort: Pulmonary effort is normal.  Abdominal:     Palpations: Abdomen is soft.     Tenderness: There is no abdominal tenderness. There is left CVA tenderness. There is no right CVA tenderness.     Comments: Gravid   Musculoskeletal:        General: Normal range of motion.     Cervical back: Normal range of motion.  Skin:    General: Skin is warm and dry.  Neurological:     General: No focal deficit present.     Mental Status: She is alert and oriented to person, place, and time.  Psychiatric:        Mood and Affect: Mood normal.        Behavior: Behavior normal.    NST FHR: 145 bpm, moderate variability, +10x10 accels, no decels Toco: quiet   Results for orders placed or performed during the hospital encounter of 07/23/22 (from the past 24 hour(s))  Urinalysis, Routine w reflex microscopic -Urine, Clean Catch     Status: None   Collection Time: 07/23/22  9:19 AM  Result Value Ref Range   Color, Urine YELLOW YELLOW   APPearance CLEAR CLEAR   Specific Gravity, Urine 1.006 1.005 - 1.030   pH 7.0 5.0 - 8.0   Glucose, UA NEGATIVE NEGATIVE mg/dL   Hgb urine dipstick NEGATIVE NEGATIVE   Bilirubin Urine NEGATIVE NEGATIVE   Ketones, ur NEGATIVE NEGATIVE mg/dL   Protein, ur NEGATIVE NEGATIVE mg/dL   Nitrite NEGATIVE NEGATIVE   Leukocytes,Ua NEGATIVE NEGATIVE  CBC     Status: Abnormal   Collection Time: 07/23/22  9:34 AM  Result Value Ref Range   WBC 8.3 4.0 - 10.5 K/uL   RBC 4.02 3.87 - 5.11 MIL/uL   Hemoglobin 12.4 12.0 - 15.0 g/dL   HCT 85.4 (L) 62.7 - 03.5 %   MCV 86.6 80.0 - 100.0 fL   MCH 30.8 26.0 - 34.0 pg   MCHC 35.6 30.0 - 36.0 g/dL   RDW 00.9 38.1 - 82.9 %   Platelets 177 150 - 400 K/uL   nRBC 0.0 0.0 - 0.2 %  Comprehensive metabolic panel     Status: Abnormal    Collection Time: 07/23/22  9:34 AM  Result Value Ref Range  Sodium 135 135 - 145 mmol/L   Potassium 3.9 3.5 - 5.1 mmol/L   Chloride 106 98 - 111 mmol/L   CO2 19 (L) 22 - 32 mmol/L   Glucose, Bld 88 70 - 99 mg/dL   BUN <5 (L) 6 - 20 mg/dL   Creatinine, Ser 3.89 0.44 - 1.00 mg/dL   Calcium 9.1 8.9 - 37.3 mg/dL   Total Protein 5.9 (L) 6.5 - 8.1 g/dL   Albumin 3.2 (L) 3.5 - 5.0 g/dL   AST 14 (L) 15 - 41 U/L   ALT 11 0 - 44 U/L   Alkaline Phosphatase 40 38 - 126 U/L   Total Bilirubin 0.4 0.3 - 1.2 mg/dL   GFR, Estimated >42 >87 mL/min   Anion gap 10 5 - 15   CT ABDOMEN PELVIS WO CONTRAST  Result Date: 07/23/2022 CLINICAL DATA:  Flank pain.  Pregnant. EXAM: CT ABDOMEN AND PELVIS WITHOUT CONTRAST TECHNIQUE: Multidetector CT imaging of the abdomen and pelvis was performed following the standard protocol without IV contrast. RADIATION DOSE REDUCTION: This exam was performed according to the departmental dose-optimization program which includes automated exposure control, adjustment of the mA and/or kV according to patient size and/or use of iterative reconstruction technique. COMPARISON:  Renal ultrasound same day.  CT urogram 06/19/2020. FINDINGS: Lower chest: No acute abnormality. Hepatobiliary: No focal liver abnormality is seen. No gallstones, gallbladder wall thickening, or biliary dilatation. Pancreas: Unremarkable. No pancreatic ductal dilatation or surrounding inflammatory changes. Spleen: Normal in size without focal abnormality. Adrenals/Urinary Tract: There is a 4 mm calculus at the left ureterovesicular junction. There is mild left-sided hydroureteronephrosis. There are additional bilateral renal punctate calculi measuring up to 3 mm. There is no perinephric fat stranding or fluid. Kidneys are normal in size. The adrenal glands and bladder are within normal limits. Stomach/Bowel: Stomach is within normal limits. Appendix appears normal. No evidence of bowel wall thickening, distention, or  inflammatory changes. Vascular/Lymphatic: No significant vascular findings are present. No enlarged abdominal or pelvic lymph nodes. Reproductive: Single intrauterine gestation is present. Ovaries are not well delineated on this study. Other: No abdominal wall hernia or abnormality. No abdominopelvic ascites. Musculoskeletal: No acute fractures are seen. IMPRESSION: 1. 4 mm calculus at the left ureterovesicular junction with mild obstructive uropathy. 2. Additional nonobstructing bilateral renal calculi. 3. Single intrauterine gestation. Electronically Signed   By: Darliss Cheney M.D.   On: 07/23/2022 18:10   US RENAL  Result Date: 07/23/2022 CLINICAL DATA:  Nephrolithiasis EXAM: RENAL / URINARY TRACT ULTRASOUND COMPLETE COMPARISON:  02/12/2020 FINDINGS: Right Kidney: Length: 12.8 cm. Echogenicity within normal limits. No mass or hydronephrosis visualized. Left Kidney: Length: 13.0 cm. Echogenicity within normal limits. No mass or identified. There is mild hydronephrosis. Bladder: Appears normal for degree of bladder distention. IMPRESSION: Mild hydronephrosis on the left.  Otherwise unremarkable study. Electronically Signed   By: Layla Maw M.D.   On: 07/23/2022 11:01    NST FHR: 145 bpm, moderate variability, +10x10 accels, no decels Toco: occasional ui  MAU Course  Procedures  MDM CBC, CMP LR, Fentanyl x2 doses Renal US NST  0930: IV started, patient given LR bolus along with Fentanyl.  1030: Patient did have relief of pain, however pain beginning to return. Additional dose of IV Fentanyl given prior to patient going to [place].  11:45: Labs unremarkable. UA negative. CBC stable- WBC 8.3. CMP unremarkable. Renal US negative showing only a mild hydronephrosis. Results reviewed by me. Discussed patient with Dr. Charlotta Newton, will given  Oxy IR and Flexeril. NST reassuring for gestational age- will d/c fetal monitoring at this time.   1330: Pain did resolve with PO pain medications. Dr. Milford Cage  from [facility] did call and request CT be ordered.   1500: Patient continues to await for CT. She reports no pain at this time.   1820: I discussed CT findings with Dr. Milford Cage. Patient remains afebrile and pain free. Will send PO pain meds, antiemetics and Flomax to pharmacy. Patient to follow up next week with [facility]   Assessment and Plan  [redacted] weeks gestation of pregnancy Flank pain in pregnant patient Nephrolithiasis   - Discharge home in stable condition - Rx's sent to patient's pharmacy - Strict return precautions reviewed. Return to MAU for new/worsening symptoms - Patient to follow up with [facility] next week and with OBGYN as scheduled   Renee Harder, CNM 07/23/2022, 6:41 PM

## 2022-10-09 LAB — OB RESULTS CONSOLE GBS: GBS: NEGATIVE

## 2022-10-09 NOTE — Patient Instructions (Signed)
Zoe Armstrong  10/09/2022   Your procedure is scheduled on:  10/26/2022  Arrive at 0530 at Entrance C on [address] at [facility]  and [address]. You are invited to use the FREE valet parking or use the Visitor's parking deck.  Pick up the phone at the desk and dial [phone].  Call this number if you have problems the morning of surgery: [phone]  Remember:   Do not eat food:(After Midnight) Desps de medianoche.  Do not drink clear liquids: (After Midnight) Desps de medianoche.  Take these medicines the morning of surgery with A SIP OF WATER:  none   Do not wear jewelry, make-up or nail polish.  Do not wear lotions, powders, or perfumes. Do not wear deodorant.  Do not shave 48 hours prior to surgery.  Do not bring valuables to the hospital.  [facility] is not   responsible for any belongings or valuables brought to the hospital.  Contacts, dentures or bridgework may not be worn into surgery.  Leave suitcase in the car. After surgery it may be brought to your room.  For patients admitted to the hospital, checkout time is 11:00 AM the day of              discharge.      Please read over the following fact sheets that you were given:     Preparing for Surgery

## 2022-10-12 ENCOUNTER — Encounter (HOSPITAL_COMMUNITY): Payer: Self-pay

## 2022-10-23 ENCOUNTER — Other Ambulatory Visit (HOSPITAL_COMMUNITY)
Admission: RE | Admit: 2022-10-23 | Discharge: 2022-10-23 | Disposition: A | Discharge status: Home / Self Care | Payer: BC Managed Care – PPO | Source: Ambulatory Visit | Attending: Obstetrics and Gynecology | Admitting: Obstetrics and Gynecology

## 2022-10-23 DIAGNOSIS — Z01812 Encounter for preprocedural laboratory examination: Secondary | ICD-10-CM | POA: Insufficient documentation

## 2022-10-23 LAB — CBC
HCT: 36.1 % (ref 36.0–46.0)
Hemoglobin: 11.7 g/dL — ABNORMAL LOW (ref 12.0–15.0)
MCH: 28.7 pg (ref 26.0–34.0)
MCHC: 32.4 g/dL (ref 30.0–36.0)
MCV: 88.5 fL (ref 80.0–100.0)
Platelets: 145 10*3/uL — ABNORMAL LOW (ref 150–400)
RBC: 4.08 MIL/uL (ref 3.87–5.11)
RDW: 13.9 % (ref 11.5–15.5)
WBC: 6.7 10*3/uL (ref 4.0–10.5)
nRBC: 0 % (ref 0.0–0.2)

## 2022-10-23 LAB — RPR: RPR Ser Ql: NONREACTIVE

## 2022-10-24 ENCOUNTER — Encounter (HOSPITAL_COMMUNITY): Payer: Self-pay | Admitting: Obstetrics and Gynecology

## 2022-10-24 NOTE — H&P (Signed)
Zoe Armstrong is a 32 y.o. female presenting for scheduled section.  PNC c/b  1) H/o fourth degree laceration - early 1hr GTT 74, elective PLTCS planned 2) Complete molar pregnancy - G2, betas trended to <1 appropriately; plan for placenta to pathology 3) Low lying placenta - resolved at 32wks 4) Polyhydramnios - AFI 26 w/ EFW 82nd%tile @ 32 wks, Weekly BPP 5) Fhx breast cancer - mother, plan first mammogram postpartum  GBS neg  OB History     Gravida  3   Para  1   Term  1   Preterm  0   AB  1   Living  1      SAB  0   IAB  0   Ectopic  0   Multiple  0   Live Births  1          Past Medical History:  Diagnosis Date   Acne    Anxiety 07/25/2014   12/26/19- not currently   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 Debroah Loop, no beta hcg drawn-->complete mole on pathology 7/7: Beta hcg 3,526 7/15: 498 7/22: 144 7/29: 56 8/12: 15 8/20: 8 8/26: 5 9/2: 3 9/9: 2 9/23: <1 10/25: <1 11/24: <1   Encounter for menstrual regulation 12/24/2015   Fatigue 12/24/2015   Headache    migraines - 12/26/19- "have not had one in a while"   History of kidney stones    passed   Menstrual extraction 04/10/2013   Weight gain 12/24/2015   Past Surgical History:  Procedure Laterality Date   DILATION AND EVACUATION N/A 12/27/2019   Procedure: DILATATION AND EVACUATION;  Surgeon: Adam Phenix, MD;  Location: [facility] OR;  Service: Gynecology;  Laterality: N/A;   NO PAST SURGERIES     OPERATIVE ULTRASOUND N/A 12/27/2019   Procedure: OPERATIVE ULTRASOUND;  Surgeon: Adam Phenix, MD;  Location: [facility] OR;  Service: Gynecology;  Laterality: N/A;   Family History: family history includes Alzheimer's disease in her maternal grandmother; Cancer in her maternal grandfather, maternal uncle, and mother; Diabetes in her paternal aunt; Heart attack in her  paternal aunt and paternal grandmother; Heart disease in her paternal grandfather; Schizophrenia in her paternal grandmother; Thyroid disease in her maternal aunt and mother. Social History:  reports that she has never smoked. She has never used smokeless tobacco. She reports that she does not drink alcohol and does not use drugs.     Maternal Diabetes: No1hr 46 (early 1hr 25) Genetic Screening: Normal Maternal Ultrasounds/Referrals: Normal Fetal Ultrasounds or other Referrals:  None Maternal Substance Abuse:  No Significant Maternal Medications:  None Significant Maternal Lab Results:  Group B Strep negative Number of Prenatal Visits:greater than 3 verified prenatal visits Other Comments:  None  Review of Systems  Constitutional:  Negative for chills and fever.  Respiratory:  Negative for shortness of breath.   Cardiovascular:  Negative for chest pain, palpitations and leg swelling.  Gastrointestinal:  Negative for abdominal pain and vomiting.  Neurological:  Negative for dizziness, weakness and headaches.  Psychiatric/Behavioral:  Negative for suicidal ideas.    Maternal Medical History:  Contractions: Frequency: rare.   Fetal activity: Perceived fetal activity is normal.   Prenatal complications: Polyhydramnios.   No bleeding or PIH.   Prenatal Complications - Diabetes: none.  unknown if currently breastfeeding. Exam Physical Exam Constitutional:      General: She is not in acute distress.    Appearance: She is well-developed.  HENT:     Head: Normocephalic and atraumatic.  Eyes:     Pupils: Pupils are equal, round, and reactive to light.  Cardiovascular:     Rate and Rhythm: Normal rate and regular rhythm.     Heart sounds: No murmur heard.    No gallop.  Abdominal:     Tenderness: There is no abdominal tenderness. There is no guarding or rebound.  Genitourinary:    Vagina: Normal.  Musculoskeletal:        General: Normal range of motion.     Cervical back:  Normal range of motion and neck supple.  Skin:    General: Skin is warm and dry.  Neurological:     Mental Status: She is alert and oriented to person, place, and time.     Prenatal labs: ABO, Rh: --/--/A POS (04/26 1610) Antibody: NEG (04/26 0929) Rubella: Immune (10/09 0000) RPR: NON REACTIVE (04/26 0928)  HBsAg: Negative (10/09 0000)  HIV: Non-reactive (10/09 0000)  GBS:   neg  Assessment/Plan: This is a 31yo G3P1011 @ 39 0/7 by 7wk TVUS presents for scheduled elective PLTCS 2/2 h/o fourth degree perineal laceration.  R/B/A of cesarean section discussed with patient. Alternative would be vaginal delivery which would mean shorter postpartum stay and decreased risk of bleeding. Risks of section include infection of the uterus, pelvic organs, or skin, inadvertent injury to internal organs, such as bowel or bladder. If there is major injury, extensive surgery may be required. If injury is minor, it may be treated with relative ease. Discussed possibility of excessive blood loss and transfusion. If bleeding cannot be controlled using medical or minor surgical methods, a cesarean hysterectomy may be performed which would mean no future fertility. Patient accepts the possibility of blood transfusion, if necessary. Patient understands and agrees to move forward with section.     Valerie Roys Tonni Mansour 10/24/2022, 10:17 PM

## 2022-10-24 NOTE — Anesthesia Preprocedure Evaluation (Signed)
Anesthesia Evaluation  Patient identified by MRN, date of birth, ID band Patient awake    Reviewed: Allergy & Precautions, NPO status , Patient's Chart, lab work & pertinent test results  Airway Mallampati: II  TM Distance: >3 FB Neck ROM: Full    Dental no notable dental hx. (+) Teeth Intact   Pulmonary neg pulmonary ROS   Pulmonary exam normal breath sounds clear to auscultation       Cardiovascular negative cardio ROS Normal cardiovascular exam Rhythm:Regular Rate:Normal     Neuro/Psych  Headaches  Anxiety        GI/Hepatic Neg liver ROS,GERD  ,,  Endo/Other  negative endocrine ROS    Renal/GU Hx/o nephrolithiasis  negative genitourinary   Musculoskeletal negative musculoskeletal ROS (+)    Abdominal  (+) + obese  Peds  Hematology  (+) Blood dyscrasia, anemia Thrombocytopenia- mild   Anesthesia Other Findings   Reproductive/Obstetrics (+) Pregnancy Hx/o 4th degree laceration last pregnancy                              Anesthesia Physical Anesthesia Plan  ASA: 2  Anesthesia Plan: Spinal   Post-op Pain Management: Minimal or no pain anticipated   Induction:   PONV Risk Score and Plan: 4 or greater and Treatment may vary due to age or medical condition and Scopolamine patch - Pre-op  Airway Management Planned: Natural Airway  Additional Equipment: None  Intra-op Plan:   Post-operative Plan:   Informed Consent: I have reviewed the patients History and Physical, chart, labs and discussed the procedure including the risks, benefits and alternatives for the proposed anesthesia with the patient or authorized representative who has indicated his/her understanding and acceptance.     Dental advisory given  Plan Discussed with: CRNA and Anesthesiologist  Anesthesia Plan Comments:          Anesthesia Quick Evaluation

## 2022-10-25 ENCOUNTER — Encounter (HOSPITAL_COMMUNITY): Payer: Self-pay | Admitting: Obstetrics and Gynecology

## 2022-10-26 ENCOUNTER — Other Ambulatory Visit: Payer: Self-pay

## 2022-10-26 ENCOUNTER — Inpatient Hospital Stay (HOSPITAL_COMMUNITY)
Admission: RE | Admit: 2022-10-26 | Discharge: 2022-10-29 | DRG: 787 | Disposition: A | Discharge status: Home / Self Care | Payer: BC Managed Care – PPO | Attending: Obstetrics and Gynecology | Admitting: Obstetrics and Gynecology

## 2022-10-26 ENCOUNTER — Inpatient Hospital Stay (HOSPITAL_COMMUNITY): Payer: BC Managed Care – PPO | Admitting: Anesthesiology

## 2022-10-26 ENCOUNTER — Encounter (HOSPITAL_COMMUNITY)
Admission: RE | Disposition: A | Discharge status: Home / Self Care | Payer: Self-pay | Source: Home / Self Care | Attending: Obstetrics and Gynecology

## 2022-10-26 ENCOUNTER — Encounter (HOSPITAL_COMMUNITY): Payer: Self-pay | Admitting: Obstetrics and Gynecology

## 2022-10-26 DIAGNOSIS — Z3A39 39 weeks gestation of pregnancy: Secondary | ICD-10-CM | POA: Diagnosis not present

## 2022-10-26 DIAGNOSIS — D62 Acute posthemorrhagic anemia: Secondary | ICD-10-CM | POA: Diagnosis not present

## 2022-10-26 DIAGNOSIS — O9902 Anemia complicating childbirth: Secondary | ICD-10-CM | POA: Diagnosis present

## 2022-10-26 DIAGNOSIS — Z87442 Personal history of urinary calculi: Secondary | ICD-10-CM

## 2022-10-26 DIAGNOSIS — Z8759 Personal history of other complications of pregnancy, childbirth and the puerperium: Secondary | ICD-10-CM

## 2022-10-26 DIAGNOSIS — O26893 Other specified pregnancy related conditions, third trimester: Secondary | ICD-10-CM | POA: Diagnosis present

## 2022-10-26 DIAGNOSIS — O403XX0 Polyhydramnios, third trimester, not applicable or unspecified: Secondary | ICD-10-CM | POA: Diagnosis present

## 2022-10-26 DIAGNOSIS — Z98891 History of uterine scar from previous surgery: Secondary | ICD-10-CM

## 2022-10-26 LAB — BPAM RBC

## 2022-10-26 LAB — TYPE AND SCREEN
Antibody Screen: NEGATIVE
Unit division: 0

## 2022-10-26 LAB — CBC
HCT: 26.1 % — ABNORMAL LOW (ref 36.0–46.0)
Hemoglobin: 8.7 g/dL — ABNORMAL LOW (ref 12.0–15.0)
MCH: 29.4 pg (ref 26.0–34.0)
MCHC: 33.3 g/dL (ref 30.0–36.0)
MCV: 88.2 fL (ref 80.0–100.0)
Platelets: 118 10*3/uL — ABNORMAL LOW (ref 150–400)
RBC: 2.96 MIL/uL — ABNORMAL LOW (ref 3.87–5.11)
RDW: 13.5 % (ref 11.5–15.5)
WBC: 8.3 10*3/uL (ref 4.0–10.5)
nRBC: 0 % (ref 0.0–0.2)

## 2022-10-26 LAB — PREPARE RBC (CROSSMATCH)

## 2022-10-26 SURGERY — Surgical Case
Anesthesia: Spinal

## 2022-10-26 MED ORDER — ACETAMINOPHEN 325 MG PO TABS
650.0000 mg | ORAL_TABLET | Freq: Once | ORAL | Status: AC
  Administered 2022-10-26: 650 mg via ORAL
  Filled 2022-10-26: qty 2

## 2022-10-26 MED ORDER — ONDANSETRON HCL 4 MG/2ML IJ SOLN
INTRAMUSCULAR | Status: AC
  Filled 2022-10-26: qty 2

## 2022-10-26 MED ORDER — METHYLERGONOVINE MALEATE 0.2 MG/ML IJ SOLN
0.2000 mg | INTRAMUSCULAR | Status: DC | PRN
  Administered 2022-10-26: 0.2 mg via INTRAMUSCULAR

## 2022-10-26 MED ORDER — DIBUCAINE (PERIANAL) 1 % EX OINT: 1.0000 | TOPICAL_OINTMENT | CUTANEOUS | Status: DC | PRN

## 2022-10-26 MED ORDER — SENNOSIDES-DOCUSATE SODIUM 8.6-50 MG PO TABS
2.0000 | ORAL_TABLET | Freq: Every day | ORAL | Status: DC
  Administered 2022-10-27 – 2022-10-29 (×3): 2 via ORAL
  Filled 2022-10-26 (×3): qty 2

## 2022-10-26 MED ORDER — LACTATED RINGERS IV SOLN: INTRAVENOUS | Status: DC

## 2022-10-26 MED ORDER — PHENYLEPHRINE 80 MCG/ML (10ML) SYRINGE FOR IV PUSH (FOR BLOOD PRESSURE SUPPORT)
PREFILLED_SYRINGE | INTRAVENOUS | Status: AC
  Filled 2022-10-26: qty 10

## 2022-10-26 MED ORDER — SCOPOLAMINE 1 MG/3DAYS TD PT72
1.0000 | MEDICATED_PATCH | TRANSDERMAL | Status: DC
  Administered 2022-10-26: 1.5 mg via TRANSDERMAL
  Filled 2022-10-26: qty 1

## 2022-10-26 MED ORDER — SIMETHICONE 80 MG PO CHEW: 80.0000 mg | CHEWABLE_TABLET | ORAL | Status: DC | PRN

## 2022-10-26 MED ORDER — SOD CITRATE-CITRIC ACID 500-334 MG/5ML PO SOLN
30.0000 mL | ORAL | Status: AC
  Administered 2022-10-26: 30 mL via ORAL

## 2022-10-26 MED ORDER — POVIDONE-IODINE 10 % EX SWAB
2.0000 | Freq: Once | CUTANEOUS | Status: AC
  Administered 2022-10-26: 2 via TOPICAL

## 2022-10-26 MED ORDER — NALOXONE HCL 0.4 MG/ML IJ SOLN: 0.4000 mg | INTRAMUSCULAR | Status: DC | PRN

## 2022-10-26 MED ORDER — PHENYLEPHRINE HCL-NACL 20-0.9 MG/250ML-% IV SOLN
INTRAVENOUS | Status: DC | PRN
  Administered 2022-10-26: 60 ug/min via INTRAVENOUS

## 2022-10-26 MED ORDER — DIPHENHYDRAMINE HCL 50 MG/ML IJ SOLN: 12.5000 mg | INTRAMUSCULAR | Status: DC | PRN

## 2022-10-26 MED ORDER — DIPHENHYDRAMINE HCL 25 MG PO CAPS: 25.0000 mg | ORAL_CAPSULE | ORAL | Status: DC | PRN

## 2022-10-26 MED ORDER — WITCH HAZEL-GLYCERIN EX PADS: 1.0000 | MEDICATED_PAD | CUTANEOUS | Status: DC | PRN

## 2022-10-26 MED ORDER — IBUPROFEN 600 MG PO TABS
600.0000 mg | ORAL_TABLET | Freq: Four times a day (QID) | ORAL | Status: DC
  Administered 2022-10-27 – 2022-10-29 (×9): 600 mg via ORAL
  Filled 2022-10-26 (×9): qty 1

## 2022-10-26 MED ORDER — COCONUT OIL OIL: 1.0000 | TOPICAL_OIL | Status: DC | PRN

## 2022-10-26 MED ORDER — CEFAZOLIN SODIUM-DEXTROSE 2-4 GM/100ML-% IV SOLN
INTRAVENOUS | Status: AC
  Filled 2022-10-26: qty 100

## 2022-10-26 MED ORDER — SODIUM CHLORIDE 0.9% IV SOLUTION: Freq: Once | INTRAVENOUS | Status: AC

## 2022-10-26 MED ORDER — KETOROLAC TROMETHAMINE 30 MG/ML IJ SOLN
30.0000 mg | Freq: Four times a day (QID) | INTRAMUSCULAR | Status: AC
  Administered 2022-10-26 – 2022-10-27 (×3): 30 mg via INTRAVENOUS
  Filled 2022-10-26 (×3): qty 1

## 2022-10-26 MED ORDER — DEXMEDETOMIDINE HCL IN NACL 80 MCG/20ML IV SOLN
INTRAVENOUS | Status: AC
  Filled 2022-10-26: qty 40

## 2022-10-26 MED ORDER — MORPHINE SULFATE (PF) 0.5 MG/ML IJ SOLN
INTRAMUSCULAR | Status: DC | PRN
  Administered 2022-10-26: .15 mg via INTRATHECAL

## 2022-10-26 MED ORDER — SODIUM CHLORIDE 0.9% FLUSH: 3.0000 mL | INTRAVENOUS | Status: DC | PRN

## 2022-10-26 MED ORDER — FENTANYL CITRATE (PF) 100 MCG/2ML IJ SOLN
INTRAMUSCULAR | Status: DC | PRN
  Administered 2022-10-26: 15 ug via INTRATHECAL

## 2022-10-26 MED ORDER — KETOROLAC TROMETHAMINE 30 MG/ML IJ SOLN: 30.0000 mg | Freq: Four times a day (QID) | INTRAMUSCULAR | Status: DC | PRN

## 2022-10-26 MED ORDER — DEXAMETHASONE SODIUM PHOSPHATE 10 MG/ML IJ SOLN
INTRAMUSCULAR | Status: AC
  Filled 2022-10-26: qty 1

## 2022-10-26 MED ORDER — FENTANYL CITRATE (PF) 100 MCG/2ML IJ SOLN
INTRAMUSCULAR | Status: AC
  Filled 2022-10-26: qty 2

## 2022-10-26 MED ORDER — FENTANYL CITRATE (PF) 100 MCG/2ML IJ SOLN: 25.0000 ug | INTRAMUSCULAR | Status: DC | PRN

## 2022-10-26 MED ORDER — SIMETHICONE 80 MG PO CHEW
80.0000 mg | CHEWABLE_TABLET | Freq: Three times a day (TID) | ORAL | Status: DC
  Administered 2022-10-26 – 2022-10-28 (×8): 80 mg via ORAL
  Filled 2022-10-26 (×9): qty 1

## 2022-10-26 MED ORDER — DIPHENHYDRAMINE HCL 25 MG PO CAPS: 25.0000 mg | ORAL_CAPSULE | Freq: Four times a day (QID) | ORAL | Status: DC | PRN

## 2022-10-26 MED ORDER — SOD CITRATE-CITRIC ACID 500-334 MG/5ML PO SOLN
ORAL | Status: AC
  Filled 2022-10-26: qty 30

## 2022-10-26 MED ORDER — OXYTOCIN-SODIUM CHLORIDE 30-0.9 UT/500ML-% IV SOLN
INTRAVENOUS | Status: AC
  Filled 2022-10-26: qty 500

## 2022-10-26 MED ORDER — MORPHINE SULFATE (PF) 0.5 MG/ML IJ SOLN
INTRAMUSCULAR | Status: AC
  Filled 2022-10-26: qty 10

## 2022-10-26 MED ORDER — ACETAMINOPHEN 500 MG PO TABS
1000.0000 mg | ORAL_TABLET | ORAL | Status: AC
  Administered 2022-10-26: 1000 mg via ORAL

## 2022-10-26 MED ORDER — NALOXONE HCL 4 MG/10ML IJ SOLN: 1.0000 ug/kg/h | INTRAVENOUS | Status: DC | PRN

## 2022-10-26 MED ORDER — DIPHENHYDRAMINE HCL 50 MG/ML IJ SOLN
25.0000 mg | Freq: Once | INTRAMUSCULAR | Status: AC
  Administered 2022-10-26: 25 mg via INTRAVENOUS
  Filled 2022-10-26: qty 1

## 2022-10-26 MED ORDER — CEFAZOLIN SODIUM-DEXTROSE 2-4 GM/100ML-% IV SOLN
2.0000 g | INTRAVENOUS | Status: AC
  Administered 2022-10-26 (×2): 2 g via INTRAVENOUS

## 2022-10-26 MED ORDER — ONDANSETRON HCL 4 MG/2ML IJ SOLN: 4.0000 mg | Freq: Three times a day (TID) | INTRAMUSCULAR | Status: DC | PRN

## 2022-10-26 MED ORDER — METHYLERGONOVINE MALEATE 0.2 MG/ML IJ SOLN
INTRAMUSCULAR | Status: AC
  Filled 2022-10-26: qty 1

## 2022-10-26 MED ORDER — MENTHOL 3 MG MT LOZG: 1.0000 | LOZENGE | OROMUCOSAL | Status: DC | PRN

## 2022-10-26 MED ORDER — ALBUMIN HUMAN 5 % IV SOLN
12.5000 g | Freq: Once | INTRAVENOUS | Status: AC
  Administered 2022-10-26: 12.5 g via INTRAVENOUS

## 2022-10-26 MED ORDER — ACETAMINOPHEN 500 MG PO TABS
1000.0000 mg | ORAL_TABLET | Freq: Four times a day (QID) | ORAL | Status: DC
  Administered 2022-10-26 – 2022-10-29 (×12): 1000 mg via ORAL
  Filled 2022-10-26 (×14): qty 2

## 2022-10-26 MED ORDER — OXYTOCIN-SODIUM CHLORIDE 30-0.9 UT/500ML-% IV SOLN
INTRAVENOUS | Status: DC | PRN
  Administered 2022-10-26: 300 [IU] via INTRAVENOUS

## 2022-10-26 MED ORDER — DEXMEDETOMIDINE HCL IN NACL 80 MCG/20ML IV SOLN
INTRAVENOUS | Status: DC | PRN
  Administered 2022-10-26 (×2): 4 ug via INTRAVENOUS

## 2022-10-26 MED ORDER — BUPIVACAINE IN DEXTROSE 0.75-8.25 % IT SOLN
INTRATHECAL | Status: DC | PRN
  Administered 2022-10-26: 1.6 mL via INTRATHECAL

## 2022-10-26 MED ORDER — PRENATAL MULTIVITAMIN CH
1.0000 | ORAL_TABLET | Freq: Every day | ORAL | Status: DC
  Administered 2022-10-26 – 2022-10-29 (×4): 1 via ORAL
  Filled 2022-10-26 (×4): qty 1

## 2022-10-26 MED ORDER — OXYCODONE HCL 5 MG PO TABS
5.0000 mg | ORAL_TABLET | ORAL | Status: DC | PRN
  Administered 2022-10-27: 5 mg via ORAL
  Administered 2022-10-27 – 2022-10-28 (×3): 10 mg via ORAL
  Filled 2022-10-26 (×2): qty 2
  Filled 2022-10-26: qty 1
  Filled 2022-10-26: qty 2

## 2022-10-26 MED ORDER — STERILE WATER FOR IRRIGATION IR SOLN
Status: DC | PRN
  Administered 2022-10-26: 1000 mL

## 2022-10-26 MED ORDER — ONDANSETRON HCL 4 MG/2ML IJ SOLN
INTRAMUSCULAR | Status: DC | PRN
  Administered 2022-10-26: 4 mg via INTRAVENOUS

## 2022-10-26 MED ORDER — METHYLERGONOVINE MALEATE 0.2 MG PO TABS: 0.2000 mg | ORAL_TABLET | ORAL | Status: DC | PRN

## 2022-10-26 MED ORDER — OXYTOCIN-SODIUM CHLORIDE 30-0.9 UT/500ML-% IV SOLN
2.5000 [IU]/h | INTRAVENOUS | Status: AC
  Administered 2022-10-26 (×2): 2.5 [IU]/h via INTRAVENOUS
  Filled 2022-10-26: qty 500

## 2022-10-26 MED ORDER — SODIUM CHLORIDE 0.9 % IR SOLN
Status: DC | PRN
  Administered 2022-10-26: 1

## 2022-10-26 MED ORDER — DEXAMETHASONE SODIUM PHOSPHATE 10 MG/ML IJ SOLN
INTRAMUSCULAR | Status: DC | PRN
  Administered 2022-10-26: 10 mg via INTRAVENOUS

## 2022-10-26 MED ORDER — MEPERIDINE HCL 25 MG/ML IJ SOLN: 6.2500 mg | INTRAMUSCULAR | Status: DC | PRN

## 2022-10-26 MED ORDER — ZOLPIDEM TARTRATE 5 MG PO TABS: 5.0000 mg | ORAL_TABLET | Freq: Every evening | ORAL | Status: DC | PRN

## 2022-10-26 MED ORDER — ACETAMINOPHEN 500 MG PO TABS
ORAL_TABLET | ORAL | Status: AC
  Filled 2022-10-26: qty 2

## 2022-10-26 SURGICAL SUPPLY — 38 items
ADH SKN CLS APL DERMABOND .7 (GAUZE/BANDAGES/DRESSINGS) ×1
APL PRP STRL LF DISP 70% ISPRP (MISCELLANEOUS) ×2
APL SKNCLS STERI-STRIP NONHPOA (GAUZE/BANDAGES/DRESSINGS) ×1
BENZOIN TINCTURE PRP APPL 2/3 (GAUZE/BANDAGES/DRESSINGS) IMPLANT
CHLORAPREP W/TINT 26 (MISCELLANEOUS) ×2 IMPLANT
CLAMP UMBILICAL CORD (MISCELLANEOUS) ×1 IMPLANT
CLOTH BEACON ORANGE TIMEOUT ST (SAFETY) ×1 IMPLANT
DERMABOND ADVANCED .7 DNX12 (GAUZE/BANDAGES/DRESSINGS) ×1 IMPLANT
DRSG OPSITE POSTOP 4X10 (GAUZE/BANDAGES/DRESSINGS) ×1 IMPLANT
ELECT REM PT RETURN 9FT ADLT (ELECTROSURGICAL) ×1
ELECTRODE REM PT RTRN 9FT ADLT (ELECTROSURGICAL) ×1 IMPLANT
EXTRACTOR VACUUM BELL STYLE (SUCTIONS) IMPLANT
GAUZE SPONGE 4X4 12PLY STRL LF (GAUZE/BANDAGES/DRESSINGS) IMPLANT
GLOVE BIO SURGEON STRL SZ 6.5 (GLOVE) ×1 IMPLANT
GLOVE BIOGEL PI IND STRL 6.5 (GLOVE) ×1 IMPLANT
GLOVE BIOGEL PI IND STRL 7.0 (GLOVE) ×2 IMPLANT
GOWN STRL REUS W/TWL LRG LVL3 (GOWN DISPOSABLE) ×2 IMPLANT
KIT ABG SYR 3ML LUER SLIP (SYRINGE) IMPLANT
NDL HYPO 25X5/8 SAFETYGLIDE (NEEDLE) IMPLANT
NEEDLE HYPO 25X5/8 SAFETYGLIDE (NEEDLE) IMPLANT
NS IRRIG 1000ML POUR BTL (IV SOLUTION) ×1 IMPLANT
PACK C SECTION WH (CUSTOM PROCEDURE TRAY) ×1 IMPLANT
PAD ABD 8X10 STRL (GAUZE/BANDAGES/DRESSINGS) IMPLANT
PAD OB MATERNITY 4.3X12.25 (PERSONAL CARE ITEMS) ×1 IMPLANT
RTRCTR C-SECT PINK 25CM LRG (MISCELLANEOUS) IMPLANT
STRIP CLOSURE SKIN 1/2X4 (GAUZE/BANDAGES/DRESSINGS) IMPLANT
SUT PDS AB 0 CTX 36 PDP370T (SUTURE) IMPLANT
SUT PLAIN 2 0 (SUTURE)
SUT PLAIN ABS 2-0 CT1 27XMFL (SUTURE) IMPLANT
SUT VIC AB 0 CT1 36 (SUTURE) ×2 IMPLANT
SUT VIC AB 2-0 CT1 27 (SUTURE) ×1
SUT VIC AB 2-0 CT1 TAPERPNT 27 (SUTURE) ×1 IMPLANT
SUT VIC AB 3-0 SH 27 (SUTURE)
SUT VIC AB 3-0 SH 27X BRD (SUTURE) IMPLANT
SUT VIC AB 4-0 KS 27 (SUTURE) ×1 IMPLANT
TOWEL OR 17X24 6PK STRL BLUE (TOWEL DISPOSABLE) ×1 IMPLANT
TRAY FOLEY W/BAG SLVR 14FR LF (SET/KITS/TRAYS/PACK) ×1 IMPLANT
WATER STERILE IRR 1000ML POUR (IV SOLUTION) ×1 IMPLANT

## 2022-10-26 NOTE — Anesthesia Procedure Notes (Signed)
Spinal  Patient location during procedure: OR Start time: 10/26/2022 7:34 AM End time: 10/26/2022 7:39 AM Reason for block: surgical anesthesia Staffing Performed: anesthesiologist  Anesthesiologist: Mal Amabile, MD Performed by: Mal Amabile, MD Authorized by: Mal Amabile, MD   Preanesthetic Checklist Completed: patient identified, IV checked, site marked, risks and benefits discussed, surgical consent, monitors and equipment checked, pre-op evaluation and timeout performed Spinal Block Patient position: sitting Prep: DuraPrep and site prepped and draped Patient monitoring: heart rate, cardiac monitor, continuous pulse ox and blood pressure Approach: midline Location: L3-4 Injection technique: single-shot Needle Needle type: Pencan  Needle gauge: 24 G Needle length: 9 cm Needle insertion depth: 6 cm Assessment Sensory level: T4 Events: CSF return Additional Notes Patient tolerated procedure well. Adequate sensory level.

## 2022-10-26 NOTE — Progress Notes (Signed)
CBC at 1050 after postop EBL and first fundal rub returned at 8.7/26.1/118 from 11.7/36.1/145. Symptomatic at first attempt to ambulate. Foley in place, good color. AAOx3.  BP (!) 95/54   Pulse (!) 50   Temp 98.4 F (36.9 C) (Oral)   Resp 16   Ht 5\' 3"  (1.6 m)   Wt 83 kg   SpO2 97%   Breastfeeding Unknown   BMI 32.41 kg/m  Continue IVF, consented for [facility], RN informed

## 2022-10-26 NOTE — Interval H&P Note (Signed)
History and Physical Interval Note:  10/26/2022 7:25 AM  Zoe Armstrong  has presented today for surgery, with the diagnosis of history of forth degree tear in vaginal delivery.  The various methods of treatment have been discussed with the patient and family. After consideration of risks, benefits and other options for treatment, the patient has consented to  Procedure(s): CESAREAN SECTION (N/A) as a surgical intervention.  The patient's history has been reviewed, patient examined, no change in status, stable for surgery.  I have reviewed the patient's chart and labs.  Questions were answered to the patient's satisfaction.     Jakeel Starliper M Pama Roskos

## 2022-10-26 NOTE — Anesthesia Postprocedure Evaluation (Signed)
Anesthesia Post Note  Patient: Zoe Armstrong  Procedure(s) Performed: CESAREAN SECTION     Patient location during evaluation: PACU Anesthesia Type: Spinal Level of consciousness: oriented and awake and alert Pain management: pain level controlled Vital Signs Assessment: post-procedure vital signs reviewed and stable Respiratory status: spontaneous breathing, respiratory function stable and nonlabored ventilation Cardiovascular status: blood pressure returned to baseline and stable Postop Assessment: no headache, no backache, no apparent nausea or vomiting and spinal receding Anesthetic complications: no   No notable events documented.  Last Vitals:  Vitals:   10/26/22 0915 10/26/22 0930  BP:  (!) 90/58  Pulse:    Resp: 18 20  Temp: 37.1 C   SpO2: 97% 97%    Last Pain:  Vitals:   10/26/22 0915  TempSrc: Axillary  PainSc: 0-No pain   Pain Goal:    LLE Motor Response: Non-purposeful movement (10/26/22 0930) LLE Sensation: Tingling (10/26/22 0930) RLE Motor Response: Non-purposeful movement (10/26/22 0930) RLE Sensation: Tingling (10/26/22 0930) L Sensory Level: T12-Inguinal (groin) region (10/26/22 0915) R Sensory Level: T12-Inguinal (groin) region (10/26/22 0915) Epidural/Spinal Function Cutaneous sensation: Able to Discern Pressure (10/26/22 0915), Patient able to flex knees: No (10/26/22 0915), Patient able to lift hips off bed: No (10/26/22 0915), Back pain beyond tenderness at insertion site: No (10/26/22 0915), Progressively worsening motor and/or sensory loss: No (10/26/22 0915), Bowel and/or bladder incontinence post epidural: No (10/26/22 0853)  [name] A.

## 2022-10-26 NOTE — Transfer of Care (Signed)
Immediate Anesthesia Transfer of Care Note  Patient: Zoe Armstrong  Procedure(s) Performed: CESAREAN SECTION  Patient Location: PACU  Anesthesia Type:Spinal  Level of Consciousness: awake, alert , and oriented  Airway & Oxygen Therapy: Patient Spontanous Breathing  Post-op Assessment: Report given to RN and Post -op Vital signs reviewed and stable  Post vital signs: Reviewed and stable HR 74, RR 20, SaO2 98%, BP 90/57  Last Vitals:  Vitals Value Taken Time  BP    Temp    Pulse    Resp    SpO2      Last Pain:  Vitals:   10/26/22 0549  TempSrc: Oral         Complications: No notable events documented.

## 2022-10-26 NOTE — Lactation Note (Signed)
This note was copied from a baby's chart. Lactation Consultation Note  Patient Name: Zoe Armstrong RUEAV'W Date: 10/26/2022 Age:33 hours  Reason for consult: Initial assessment;Term  P2, GA [redacted]w[redacted]d, C/S delivery, blood transfusion in process  Mother is resting and receptive to [facility] visit. She has several visitors present. Mother is receiving a blood transfusion. She reports baby Zoe "Zoe Armstrong" has breast fed and latched well once, feeding on both breast. By choice, mother requested baby get formula once (10 ml) because of not feeling good.   Mother reports a history of breastfeeding her other child for 1 year.   Encouraged skin to skin, feeding baby with cues and getting help as needed.   Mom made aware of O/P services, breastfeeding support groups, community resources, and our phone # for post-discharge questions.     Maternal Data Has patient been taught Hand Expression?: No Does the patient have breastfeeding experience prior to this delivery?: Yes How long did the patient breastfeed?: 1 year (daughter now 23.80 years old)  Feeding Mother's Current Feeding Choice: Breast Milk and Formula  LATCH Score  Not observed  Interventions Interventions: Education;LC Services brochure   Consult Status Consult Status: Follow-up Date: 10/27/22 Follow-up type: In-patient    Christella Hartigan M 10/26/2022, 6:11 PM

## 2022-10-26 NOTE — Op Note (Signed)
C-Section Operative Note  Date: 10/26/22  Preoperative Diagnosis: IUP @ 39 0/7, polyhydramnios Postoperative Diagnosis: same as above Indications: History of fourth degree laceration Procedure: PLTCS without extension Findings: Viable female infant weighing 3730 with APGARS of 8 and 9 at 1 and 5 minutes, respectively. Normal appearing uterus, bilateral fallopian tubes and ovaries. Of note, placenta somewhat adherent anteriorly which added to EBL Specimens: placenta to pathology EBL 1477 IVF 2500 UOP 200  Patient Course: Patient presents for scheduled PLTCS - h/o fourth deg in G1, counseled on elective PLTCS. G2 was complete molar with appropriately trended beta hCG> LLP resolved this pregnancy, patient counseled on placenta to pathology given h/o molar  Consent:  R/B/A of cesarean section discussed with patient. Alternative would be vaginal delivery which would mean shorter postpartum stay and decreased risk of bleeding. Risks of section include infection of the uterus, pelvic organs, or skin, inadvertent injury to internal organs, such as bowel or bladder. If there is major injury, extensive surgery may be required. If injury is minor, it may be treated with relative ease. Discussed possibility of excessive blood loss and transfusion. If bleeding cannot be controlled using medical or minor surgical methods, a cesarean hysterectomy may be performed which would mean no future fertility. Patient accepts the possibility of blood transfusion, if necessary. Patient understands and agrees to move forward with section.   Operative Procedure: Patient was taken to the operating room where epidural anesthesia was found to be adequate by Allis clamp test. She was prepped and draped in the normal sterile fashion in the dorsal supine position with a leftward tilt. An appropriate time out was performed. A Pfannenstiel skin incision was then made with the scalpel and carried through to the underlying layer of  fascia by sharp dissection and Bovie cautery. The fascia was nicked in the midline and the incision was extended laterally with Mayo scissors. The superior aspect of the incision was grasped Coker clamps and dissected off the underlying rectus muscles. In a similar fashion the inferior aspect was dissected off the rectus muscles. Rectus muscles were separated in the midline and the peritoneal cavity entered bluntly. The peritoneal incision was then extended both superiorly and inferiorly with careful attention to avoid both bowel and bladder. The Alexis self-retaining wound retractor was then placed within the incision and the lower uterine segment exposed. The bladder flap was developed with Metzenbaum scissors and pushed away from the lower uterine segment. The lower uterine segment was then incised in a low transverse fashion and the cavity itself entered bluntly. Of note, multiple sinuses noted during hysterotomy which added to EBL. The incision was extended bluntly. Amniotic sac was ruptured and fluid was noted to be clear in color. The infant's head was then lifted and delivered from the incision without difficulty using the standard movements. The remainder of the infant delivered and the nose and mouth bulb suctioned with the cord clamped and cut as well. The infant was handed off to NICU. The placenta was then expressed from the uterus - again noted to be somewhat adherent anteriorly (h/o LLP) and the uterus cleared of all clots and debris with moist lap sponge. The uterine incision was then repaired in 2 layers the first layer was a running locked layer of 0-vicryl and the second an imbricating layer of the same suture. The tubes and ovaries were inspected and the gutters cleared of all clots and debris. The uterine incision was inspected and found to be hemostatic. All instruments and sponges as  well as the Alexis retractor were then removed from the abdomen. The fascia was then closed with 0 Vicryl in a  running fashion. The skin was closed with a subcuticular stitch of 4-0 Vicryl on a Keith needle and then reinforced with Dermabond and a Honeycomb. At the conclusion of the procedure all instruments and sponge counts were correct. Patient was taken to the recovery room in good condition with her baby accompanying her skin to skin.

## 2022-10-27 LAB — TYPE AND SCREEN
ABO/RH(D): A POS
Unit division: 0

## 2022-10-27 LAB — CBC
HCT: 29.5 % — ABNORMAL LOW (ref 36.0–46.0)
Hemoglobin: 10 g/dL — ABNORMAL LOW (ref 12.0–15.0)
MCH: 29.2 pg (ref 26.0–34.0)
MCHC: 33.9 g/dL (ref 30.0–36.0)
MCV: 86.3 fL (ref 80.0–100.0)
Platelets: 120 10*3/uL — ABNORMAL LOW (ref 150–400)
RBC: 3.42 MIL/uL — ABNORMAL LOW (ref 3.87–5.11)
RDW: 14.4 % (ref 11.5–15.5)
WBC: 11.4 10*3/uL — ABNORMAL HIGH (ref 4.0–10.5)
nRBC: 0 % (ref 0.0–0.2)

## 2022-10-27 LAB — BPAM RBC
Blood Product Expiration Date: 202405202359
Blood Product Expiration Date: 202405202359
ISSUE DATE / TIME: 202404291728
ISSUE DATE / TIME: 202404291946
Unit Type and Rh: 6200
Unit Type and Rh: 6200

## 2022-10-27 NOTE — Progress Notes (Signed)
Subjective: Postpartum Day 1: Cesarean Delivery Patient reports pain controlled, no nausea or vomiting, tolerating po. Denies dizziness with ambulation after blood transfusion   Objective: Vital signs in last 24 hours: Temp:  [97.7 F (36.5 C)-98.6 F (37 C)] 97.7 F (36.5 C) (04/30 0253) Pulse Rate:  [47-62] 53 (04/30 0013) Resp:  [16-19] 19 (04/30 0253) BP: (90-110)/(53-65) 94/57 (04/30 0013) SpO2:  [95 %-100 %] 99 % (04/30 0603) Vitals:   10/27/22 0013 10/27/22 0253 10/27/22 0410 10/27/22 0603  BP: (!) 94/57     Pulse: (!) 53     Resp: 17 19    Temp: 98 F (36.7 C) 97.7 F (36.5 C)    TempSrc: Oral Oral    SpO2: 98% 99% 99% 99%  Weight:      Height:         Physical Exam:  General: alert, cooperative, and appears stated age 32: appropriate Uterine Fundus: firm Incision: healing well DVT Evaluation: No evidence of DVT seen on physical exam.  Recent Labs    10/26/22 1050 10/27/22 0519  HGB 8.7* 10.0*  HCT 26.1* 29.5*    Assessment/Plan: Status post Cesarean section. Doing well postoperatively.  Continue current care. Acute blood loss anemia, received 2 units pRBCs. Feeling better thi am. Cont PNV Desires neonatal circumcision, R/B/A of procedure discussed at length. Pt understands that neonatal circumcision is not considered medically necessary and is elective. The risks include, but are not limited to bleeding, infection, damage to the penis, development of scar tissue, and having to have it redone at a later date. Pt understands theses risks and wishes to proceed   Waynard Reeds, MD 10/27/2022, 10:38 AM

## 2022-10-27 NOTE — Social Work (Signed)
MOB was referred for history of depression/anxiety.  * Referral screened out by Clinical Social Worker because none of the following criteria appear to apply:  ~ History of anxiety/depression during this pregnancy, or of post-partum depression following prior delivery.  ~ Diagnosis of anxiety and/or depression within last 3 years Per chart review MOB diagnosis dates back to 2016 and no concerns noted since 2021.  OR * MOB's symptoms currently being treated with medication and/or therapy.  Please contact the Clinical Social Worker if needs arise, by [facility] request, or if MOB scores greater than 9/yes to question 10 on Edinburgh Postpartum Depression Screen.  Wende Neighbors, LCSWA Clinical Social Worker [phone]

## 2022-10-28 LAB — SURGICAL PATHOLOGY

## 2022-10-28 NOTE — Lactation Note (Signed)
This note was copied from a baby's chart. Lactation Consultation Note  Patient Name: Zoe Armstrong ZOXWR'U Date: 10/28/2022 Age:33 hours Reason for consult: Follow-up assessment;Primapara F/u w/mom to see how BF was going. Mom stated baby is cluster feeding. Mom had baby swaddled and trying to latch in cradle position. Baby unable to obtain deep latch. Repositioned in football hold. Baby latched right away BF well. Mom stated it feels much better, discussed positions, support.  Encouraged to call for assistance as needed.  Maternal Data    Feeding Nipple Type: Slow - flow  LATCH Score Latch: Grasps breast easily, tongue down, lips flanged, rhythmical sucking.  Audible Swallowing: A few with stimulation  Type of Nipple: Everted at rest and after stimulation  Comfort (Breast/Nipple): Soft / non-tender  Hold (Positioning): Assistance needed to correctly position infant at breast and maintain latch.  LATCH Score: 8   Lactation Tools Discussed/Used    Interventions Interventions: Breast feeding basics reviewed;Assisted with latch;Skin to skin;Adjust position;Support pillows;Position options  Discharge    Consult Status Consult Status: Follow-up Date: 10/28/22 Follow-up type: In-patient    Zoanne Newill, Diamond Nickel 10/28/2022, 2:28 AM

## 2022-10-28 NOTE — Lactation Note (Signed)
This note was copied from a baby's chart. Lactation Consultation Note  Patient Name: Zoe Armstrong [id number] Date: 10/28/2022 Age:33 hours Reason for consult: Follow-up assessment;Term Aunt holding baby giving formula. Baby only took 5 ml. Sleepy. Mom stated the baby hadn't had but 2 bottles today that she has been BF all day.  Mom's nipples are sore. Baby is pinching some. Discussed positioning, props, cheeks to breast and flanging lips. Asked mom to call for assistance as needed.  Maternal Data    Feeding Mother's Current Feeding Choice: Breast Milk and Formula Nipple Type: Slow - flow  LATCH Score                    Lactation Tools Discussed/Used    Interventions    Discharge    Consult Status Consult Status: Follow-up Date: 10/29/22 Follow-up type: In-patient    Zoe Armstrong 10/28/2022, 11:56 PM

## 2022-10-28 NOTE — Progress Notes (Signed)
POD #2 Sore but overall ok Afeb, VSS Abd- soft, fundus firm, incision intact Continue routine care, ambulate

## 2022-10-29 MED ORDER — OXYCODONE-ACETAMINOPHEN 5-325 MG PO TABS: 1.0000 | ORAL_TABLET | ORAL | 0 refills | Status: AC | PRN

## 2022-10-29 MED ORDER — IBUPROFEN 600 MG PO TABS: 600.0000 mg | ORAL_TABLET | Freq: Four times a day (QID) | ORAL | 1 refills | Status: DC | PRN

## 2022-10-29 NOTE — Progress Notes (Signed)
Subjective: Postpartum Day 3: Cesarean Delivery Patient reports incisional pain, tolerating PO, + flatus, + BM, and no problems voiding.  She denies HA, CP or lightheadedness. She has some mild SOB due to abdominal discomfort. Pain meds helping. She is bonding well with baby. Feles ready for discharge home today  Objective: Vital signs in last 24 hours: Temp:  [97.9 F (36.6 C)-98.2 F (36.8 C)] 97.9 F (36.6 C) (05/02 0516) Pulse Rate:  [62-69] 63 (05/02 0516) Resp:  [16] 16 (05/02 0516) BP: (105-108)/(59-66) 105/59 (05/02 0516) SpO2:  [99 %-100 %] 99 % (05/02 0516)  Physical Exam:  General: alert, cooperative, and no distress Lochia: appropriate Uterine Fundus: firm Incision: no significant drainage DVT Evaluation: No evidence of DVT seen on physical exam. Calf/Ankle edema is present.  Recent Labs    10/26/22 1050 10/27/22 0519  HGB 8.7* 10.0*  HCT 26.1* 29.5*    Assessment/Plan: Status post Cesarean section. Doing well postoperatively.  Discharge home with standard precautions and return to clinic in 2weeks for incision check and 6weeks for postpartum visit  Unsure BC.  Cathrine Muster, DO 10/29/2022, 10:37 AM

## 2022-10-29 NOTE — Discharge Summary (Signed)
Postpartum Discharge Summary  Date of Service updated      Patient Name: Zoe Armstrong DOB: 1991/03/05 MRN: [id number]  Date of admission: 10/26/2022 Delivery date:10/26/2022  Delivering provider: Carlisle Cater  Date of discharge: 10/29/2022  Admitting diagnosis: History of fourth degree perineal laceration [Z87.59] Intrauterine pregnancy: [redacted]w[redacted]d     Secondary diagnosis:  Principal Problem:   History of fourth degree perineal laceration  Additional problems: n/a    Discharge diagnosis: Term Pregnancy Delivered                                              Post partum procedures: n/a Augmentation: N/A Complications: Hemorrhage>1060mL  Hospital course: Sceduled C/S   32 y.o. yo [name] at [redacted]w[redacted]d was admitted to the hospital 10/26/2022 for scheduled cesarean section with the following indication:Elective Primary.Delivery details are as follows:  Membrane Rupture Time/Date: 8:01 AM ,10/26/2022   Delivery Method:C-Section, Low Transverse  Details of operation can be found in separate operative note.  Patient had a postpartum course complicated by pp hemorrhage.  She is ambulating, tolerating a regular diet, passing flatus, and urinating well. Patient is discharged home in stable condition on  10/29/22        Newborn Data: Birth date:10/26/2022  Birth time:8:01 AM  Gender:Female  Living status:Living  Apgars:8 ,9  Weight:3730 g     Magnesium Sulfate received: No BMZ received: No  Physical exam  Vitals:   10/28/22 0620 10/28/22 1427 10/28/22 2010 10/29/22 0516  BP: 110/72 108/62 107/66 (!) 105/59  Pulse: 63 69 62 63  Resp: 16 16 16 16   Temp: 97.7 F (36.5 C) 98.2 F (36.8 C) 97.9 F (36.6 C) 97.9 F (36.6 C)  TempSrc: Oral Oral Oral Oral  SpO2: 100% 99% 100% 99%  Weight:      Height:       General: alert, cooperative, and no distress Lochia: appropriate Uterine Fundus: firm Incision: Healing well with no significant drainage, Dressing is clean, dry, and intact DVT  Evaluation: No evidence of DVT seen on physical exam. Labs: Lab Results  Component Value Date   WBC 11.4 (H) 10/27/2022   HGB 10.0 (L) 10/27/2022   HCT 29.5 (L) 10/27/2022   MCV 86.3 10/27/2022   PLT 120 (L) 10/27/2022      Latest Ref Rng & Units 07/23/2022    9:34 AM  CMP  Glucose 70 - 99 mg/dL 88   BUN 6 - 20 mg/dL <5   Creatinine 3.08 - 1.00 mg/dL 6.57   Sodium 846 - 962 mmol/L 135   Potassium 3.5 - 5.1 mmol/L 3.9   Chloride 98 - 111 mmol/L 106   CO2 22 - 32 mmol/L 19   Calcium 8.9 - 10.3 mg/dL 9.1   Total Protein 6.5 - 8.1 g/dL 5.9   Total Bilirubin 0.3 - 1.2 mg/dL 0.4   Alkaline Phos 38 - 126 U/L 40   AST 15 - 41 U/L 14   ALT 0 - 44 U/L 11    Edinburgh Score:    10/28/2022   12:02 AM  Edinburgh Postnatal Depression Scale Screening Tool  I have been able to laugh and see the funny side of things. 0  I have looked forward with enjoyment to things. 0  I have blamed myself unnecessarily when things went wrong. 2  I have been anxious or worried for  no good reason. 0  I have felt scared or panicky for no good reason. 1  Things have been getting on top of me. 1  I have been so unhappy that I have had difficulty sleeping. 0  I have felt sad or miserable. 0  I have been so unhappy that I have been crying. 0  The thought of harming myself has occurred to me. 0  Edinburgh Postnatal Depression Scale Total 4      After visit meds:  Allergies as of 10/29/2022   No Known Allergies      Medication List     TAKE these medications    cyclobenzaprine 10 MG tablet Commonly known as: FLEXERIL Take 1 tablet (10 mg total) by mouth 2 (two) times daily as needed for muscle spasms.   metroNIDAZOLE 500 MG tablet Commonly known as: Flagyl Take 1 tablet (500 mg total) by mouth 2 (two) times daily.   prenatal multivitamin Tabs tablet Take 1 tablet by mouth in the morning. Ritual Prenatal Vitamin Folate & Choline for Neural Tube Support   promethazine 25 MG tablet Commonly  known as: PHENERGAN Take 1 tablet (25 mg total) by mouth every 6 (six) hours as needed for nausea or vomiting.   tamsulosin 0.4 MG Caps capsule Commonly known as: FLOMAX Take 1 capsule (0.4 mg total) by mouth daily.         Discharge home in stable condition Infant Feeding: Breast Infant Disposition:home with mother Discharge instruction: per After Visit Summary and Postpartum booklet. Activity: Advance as tolerated. Pelvic rest for 6 weeks.  Diet: iron rich diet Anticipated Birth Control: Unsure Postpartum Appointment:6 weeks Additional Postpartum F/U: Incision check 2weeks Future Appointments:No future appointments. Follow up Visit:  Follow-up Information     [facility]. Schedule an appointment as soon as possible for a visit in 6 week(s).   Why: For postpartum visit Contact information: [address]  [address] [place] [address] [phone]                     10/29/2022 Cathrine Muster, DO

## 2022-10-29 NOTE — Lactation Note (Signed)
This note was copied from a baby's chart. Lactation Consultation Note  Patient Name: Zoe Armstrong GEXBM'W Date: 10/29/2022 Age:33 hours Reason for consult: Follow-up assessment;Infant weight loss;Nipple pain/trauma;Term;Breastfeeding assistance (7 % weight loss,) Baby ready to feed, LC offered to assist with the football position.  LC noted areola edema and a positional strip on the right nipple.  LC reviewed steps for latching - breast massage, hand express, pre- pump if needed and  Reverse pressure, firm support and breast compressions with latch until swallows.  LC reviewed breast feeding goals and by 3 hours if not awake to check the diaper, change if needed and offer the breast STS.  LC reviewed BF D/C teaching and the [facility] resources.   Maternal Data Has patient been taught Hand Expression?: Yes (excellent flow) Does the patient have breastfeeding experience prior to this delivery?: Yes  Feeding Mother's Current Feeding Choice: Breast Milk and Formula Nipple Type: Slow - flow  LATCH Score Latch: Grasps breast easily, tongue down, lips flanged, rhythmical sucking.  Audible Swallowing: Spontaneous and intermittent  Type of Nipple: Everted at rest and after stimulation  Comfort (Breast/Nipple): Filling, red/small blisters or bruises, mild/mod discomfort  Hold (Positioning): Assistance needed to correctly position infant at breast and maintain latch.  LATCH Score: 8   Lactation Tools Discussed/Used Tools: Shells;Pump;Flanges Flange Size: 21;24;27 Breast pump type: Manual Pump Education: Milk Storage;Setup, frequency, and cleaning;Other (comment) (LC reviewed) Reason for Pumping: prn  Interventions Interventions: Breast feeding basics reviewed;Assisted with latch;Skin to skin;Breast massage;Hand express;Breast compression;Adjust position;Support pillows;Position options;Shells;Hand pump;Education;LC Services brochure  Discharge Discharge Education: Engorgement and breast  care;Warning signs for feeding baby Pump: Personal;DEBP;Manual WIC Program: No  Consult Status Consult Status: Complete Date: 10/29/22    Kathrin Greathouse 10/29/2022, 10:35 AM

## 2022-10-29 NOTE — Discharge Instructions (Signed)
Call office with any concerns [phone] 

## 2022-11-06 ENCOUNTER — Telehealth (HOSPITAL_COMMUNITY): Payer: Self-pay | Admitting: *Deleted

## 2022-11-06 NOTE — Telephone Encounter (Signed)
Voice mailbox is full. Unable to leave message.  Duffy Rhody, RN 11-06-2022 at 11:27am

## 2022-12-27 ENCOUNTER — Emergency Department (HOSPITAL_BASED_OUTPATIENT_CLINIC_OR_DEPARTMENT_OTHER): Payer: BC Managed Care – PPO

## 2022-12-27 ENCOUNTER — Other Ambulatory Visit: Payer: Self-pay

## 2022-12-27 ENCOUNTER — Encounter (HOSPITAL_BASED_OUTPATIENT_CLINIC_OR_DEPARTMENT_OTHER): Payer: Self-pay | Admitting: Emergency Medicine

## 2022-12-27 ENCOUNTER — Emergency Department (HOSPITAL_BASED_OUTPATIENT_CLINIC_OR_DEPARTMENT_OTHER)
Admission: EM | Admit: 2022-12-27 | Discharge: 2022-12-27 | Disposition: A | Discharge status: Home / Self Care | Payer: BC Managed Care – PPO | Attending: Emergency Medicine | Admitting: Emergency Medicine

## 2022-12-27 ENCOUNTER — Inpatient Hospital Stay (EMERGENCY_DEPARTMENT_HOSPITAL)
Admission: AD | Admit: 2022-12-27 | Discharge: 2022-12-27 | Disposition: A | Discharge status: Home / Self Care | Payer: BC Managed Care – PPO | Source: Home / Self Care | Attending: Obstetrics and Gynecology | Admitting: Obstetrics and Gynecology

## 2022-12-27 DIAGNOSIS — R109 Unspecified abdominal pain: Secondary | ICD-10-CM | POA: Diagnosis present

## 2022-12-27 DIAGNOSIS — N72 Inflammatory disease of cervix uteri: Secondary | ICD-10-CM

## 2022-12-27 DIAGNOSIS — R1031 Right lower quadrant pain: Secondary | ICD-10-CM | POA: Insufficient documentation

## 2022-12-27 DIAGNOSIS — N898 Other specified noninflammatory disorders of vagina: Secondary | ICD-10-CM | POA: Insufficient documentation

## 2022-12-27 HISTORY — DX: Urinary tract infection, site not specified: N39.0

## 2022-12-27 LAB — CBC WITH DIFFERENTIAL/PLATELET
Abs Immature Granulocytes: 0.03 10*3/uL (ref 0.00–0.07)
Basophils Absolute: 0 10*3/uL (ref 0.0–0.1)
Basophils Relative: 0 %
Eosinophils Absolute: 0.1 10*3/uL (ref 0.0–0.5)
Eosinophils Relative: 1 %
HCT: 44.3 % (ref 36.0–46.0)
Hemoglobin: 14.5 g/dL (ref 12.0–15.0)
Immature Granulocytes: 0 %
Lymphocytes Relative: 13 %
Lymphs Abs: 1.2 10*3/uL (ref 0.7–4.0)
MCH: 28.2 pg (ref 26.0–34.0)
MCHC: 32.7 g/dL (ref 30.0–36.0)
MCV: 86.2 fL (ref 80.0–100.0)
Monocytes Absolute: 0.4 10*3/uL (ref 0.1–1.0)
Monocytes Relative: 4 %
Neutro Abs: 7.9 10*3/uL — ABNORMAL HIGH (ref 1.7–7.7)
Neutrophils Relative %: 82 %
Platelets: 229 10*3/uL (ref 150–400)
RBC: 5.14 MIL/uL — ABNORMAL HIGH (ref 3.87–5.11)
RDW: 12.6 % (ref 11.5–15.5)
WBC: 9.6 10*3/uL (ref 4.0–10.5)
nRBC: 0 % (ref 0.0–0.2)

## 2022-12-27 LAB — PREGNANCY, URINE: Preg Test, Ur: NEGATIVE

## 2022-12-27 LAB — WET PREP, GENITAL
Clue Cells Wet Prep HPF POC: NONE SEEN
Sperm: NONE SEEN
Trich, Wet Prep: NONE SEEN
WBC, Wet Prep HPF POC: >=10 — AB (ref <10)
Yeast Wet Prep HPF POC: NONE SEEN

## 2022-12-27 LAB — COMPREHENSIVE METABOLIC PANEL
ALT: 44 U/L (ref 0–44)
AST: 18 U/L (ref 15–41)
Albumin: 5 g/dL (ref 3.5–5.0)
Alkaline Phosphatase: 62 U/L (ref 38–126)
Anion gap: 9 (ref 5–15)
BUN: 10 mg/dL (ref 6–20)
CO2: 27 mmol/L (ref 22–32)
Calcium: 9.7 mg/dL (ref 8.9–10.3)
Chloride: 103 mmol/L (ref 98–111)
Creatinine, Ser: 0.62 mg/dL (ref 0.44–1.00)
GFR, Estimated: >60 mL/min (ref >60)
Glucose, Bld: 91 mg/dL (ref 70–99)
Potassium: 3.7 mmol/L (ref 3.5–5.1)
Sodium: 139 mmol/L (ref 135–145)
Total Bilirubin: 0.6 mg/dL (ref 0.3–1.2)
Total Protein: 8 g/dL (ref 6.5–8.1)

## 2022-12-27 LAB — PROTIME-INR
INR: 1 (ref 0.8–1.2)
Prothrombin Time: 13.2 seconds (ref 11.4–15.2)

## 2022-12-27 LAB — URINALYSIS, W/ REFLEX TO CULTURE (INFECTION SUSPECTED)
Bilirubin Urine: NEGATIVE
Glucose, UA: NEGATIVE mg/dL
Ketones, ur: NEGATIVE mg/dL
Nitrite: NEGATIVE
Protein, ur: NEGATIVE mg/dL
Specific Gravity, Urine: 1.017 (ref 1.005–1.030)
WBC, UA: >50 WBC/hpf (ref 0–5)
pH: 6.5 (ref 5.0–8.0)

## 2022-12-27 LAB — LACTIC ACID, PLASMA: Lactic Acid, Venous: 0.6 mmol/L (ref 0.5–1.9)

## 2022-12-27 MED ORDER — DOXYCYCLINE HYCLATE 100 MG PO CAPS: 100.0000 mg | ORAL_CAPSULE | Freq: Two times a day (BID) | ORAL | 0 refills | Status: AC

## 2022-12-27 MED ORDER — ONDANSETRON 4 MG PO TBDP: 4.0000 mg | ORAL_TABLET | Freq: Three times a day (TID) | ORAL | 0 refills | Status: DC | PRN

## 2022-12-27 MED ORDER — IOHEXOL 300 MG/ML  SOLN
100.0000 mL | Freq: Once | INTRAMUSCULAR | Status: AC | PRN
  Administered 2022-12-27: 80 mL via INTRAVENOUS

## 2022-12-27 MED ORDER — OXYCODONE-ACETAMINOPHEN 5-325 MG PO TABS: 1.0000 | ORAL_TABLET | ORAL | 0 refills | Status: DC | PRN

## 2022-12-27 MED ORDER — LACTATED RINGERS IV BOLUS (SEPSIS)
1000.0000 mL | Freq: Once | INTRAVENOUS | Status: AC
  Administered 2022-12-27: 1000 mL via INTRAVENOUS

## 2022-12-27 MED ORDER — METRONIDAZOLE 500 MG PO TABS: 500.0000 mg | ORAL_TABLET | Freq: Two times a day (BID) | ORAL | 0 refills | Status: AC

## 2022-12-27 MED ORDER — MORPHINE SULFATE (PF) 4 MG/ML IV SOLN
4.0000 mg | Freq: Once | INTRAVENOUS | Status: AC
  Administered 2022-12-27: 4 mg via INTRAVENOUS
  Filled 2022-12-27: qty 1

## 2022-12-27 MED ORDER — SODIUM CHLORIDE 0.9 % IV SOLN
3.0000 g | Freq: Once | INTRAVENOUS | Status: AC
  Administered 2022-12-27: 3 g via INTRAVENOUS

## 2022-12-27 MED ORDER — DOXYCYCLINE HYCLATE 100 MG PO TABS
100.0000 mg | ORAL_TABLET | Freq: Once | ORAL | Status: AC
  Administered 2022-12-27: 100 mg via ORAL
  Filled 2022-12-27: qty 1

## 2022-12-27 MED ORDER — LACTATED RINGERS IV SOLN: INTRAVENOUS | Status: DC

## 2022-12-27 MED ORDER — MORPHINE SULFATE (PF) 4 MG/ML IV SOLN
4.0000 mg | INTRAVENOUS | Status: DC | PRN
  Administered 2022-12-27: 4 mg via INTRAVENOUS
  Filled 2022-12-27: qty 1

## 2022-12-27 MED ORDER — ONDANSETRON HCL 4 MG/2ML IJ SOLN
4.0000 mg | Freq: Once | INTRAMUSCULAR | Status: AC
  Administered 2022-12-27: 4 mg via INTRAVENOUS
  Filled 2022-12-27: qty 2

## 2022-12-27 NOTE — MAU Note (Signed)
Zoe Armstrong is a 32 y.o. at 24w6dPP here in MAU reporting: yesterday around 1630, started feeling kind of off.  Pain in RLQ.Marland Kitchen  took 2 tylenol, started getting cold, arms were achy.  Having a foul d/c still from delivery, has gotten worse. Called office.  Started feeling nauseous this morning.  Just couldn't wait.  Hard to even walk. Denies diarrhea, constipation. No urinary problems. LMP:  not since delivery, just resumed relations on 6/28 Onset of complaint: yesterday afternoon Pain score: 7 Vitals:   12/27/22 0927  BP: 118/69  Pulse: (!) 126  Resp: 18  Temp: 98.1 F (36.7 C)  SpO2: 100%      Lab orders placed from triage:

## 2022-12-27 NOTE — Sepsis Progress Note (Signed)
Sepsis protocol monitored by eLink ?

## 2022-12-27 NOTE — ED Provider Notes (Signed)
3:39 PM Care assumed from Dr. Rhunette Croft.  At time of transfer of care, patient is waiting for results of pelvic ultrasound to look for concerning evidence of abscess or complex fluid collection on pelvic ultrasound.  Previous team spoke to OB/GYN and if there is no complicated findings on ultrasound, plan of care be to discharge home with doxycycline and follow-up in clinic with OB/GYN on Tuesday for reassessment.  If there is more complex finding, will call Dr. Chestine Spore with OB/GYN team back to discuss disposition.  5:14 PM Ultrasound returned and did not show evidence of TOA or other ovarian problem.  It showed some evidence of fluid in the uterus.  I called and spoke with Dr. Chestine Spore who reviewed the images herself and they feel the patient is safe for discharge home to take doxycycline and Flagyl orally.  Will also give prescription for pain and nausea medicine and she will see her OB/GYN in the next few days in clinic.  Patient and family understand return precautions and follow-up instructions and patient was discharged in good condition with reassuring vital signs.  Clinical Impression: 1. Cervicitis and endocervicitis     Disposition: Discharge  Condition: Good  I have discussed the results, Dx and Tx plan with the pt(& family if present). He/she/they expressed understanding and agree(s) with the plan. Discharge instructions discussed at great length. Strict return precautions discussed and pt &/or family have verbalized understanding of the instructions. No further questions at time of discharge.    New Prescriptions   DOXYCYCLINE (VIBRAMYCIN) 100 MG CAPSULE    Take 1 capsule (100 mg total) by mouth 2 (two) times daily for 14 days.   METRONIDAZOLE (FLAGYL) 500 MG TABLET    Take 1 tablet (500 mg total) by mouth 2 (two) times daily for 14 days.   ONDANSETRON (ZOFRAN-ODT) 4 MG DISINTEGRATING TABLET    Take 1 tablet (4 mg total) by mouth every 8 (eight) hours as needed for nausea or vomiting.    OXYCODONE-ACETAMINOPHEN (PERCOCET/ROXICET) 5-325 MG TABLET    Take 1 tablet by mouth every 4 (four) hours as needed for severe pain.    Follow Up: your obgyn        Corita Allinson, Canary Brim, MD 12/27/22 1719

## 2022-12-27 NOTE — ED Triage Notes (Signed)
Pt is 6 weeks postpartum,having foul smelling discharge that resembles what we have with pregnancy. Yesterday her c section incision started hurting suddenly, extreme pain still today. She was breastfeeding up until a week ago. Chills.

## 2022-12-27 NOTE — MAU Provider Note (Signed)
Event Date/Time   First Provider Initiated Contact with Patient 12/27/22 [phone]      S Ms. Jayden Alfredo is a 32 y.o. G3P2012 8.6 weeks PP patient who presents to MAU today with complaint of "on-going, foul-smelling" vaginal discharge, RLQ pain (rated 7/10) and nausea. She reports she called her OB provider's office this morning at 0645, but the call wasn't returned until 0730 or 0745Her C/S was on 10/26/2022. She reports she "knows" the pain is coming from her incision and that she has a uterine infection.   O BP 118/69 (BP Location: Right Arm)   Pulse (!) 126   Temp 98.1 F (36.7 C) (Oral)   Resp 18   Ht 5\' 3"  (1.6 m)   Wt 71.6 kg   SpO2 100%   Breastfeeding No Comment: he is doing good  BMI 27.95 kg/m   Physical Exam Vitals and nursing note reviewed.  Constitutional:      Appearance: Normal appearance. She is normal weight.  Cardiovascular:     Rate and Rhythm: Tachycardia present.  Pulmonary:     Effort: Pulmonary effort is normal.  Musculoskeletal:        General: Normal range of motion.  Skin:    Coloration: Skin is pale.  Neurological:     Mental Status: She is alert and oriented to person, place, and time.  Psychiatric:        Attention and Perception: Attention and perception normal.        Mood and Affect: Affect is angry.        Speech: Speech normal.        Behavior: Behavior is agitated.        Thought Content: Thought content normal.    A Medical screening exam complete 1. Acute right lower quadrant pain  2. Foul smelling vaginal discharge    P Explained that MAU postpartum period is up to 6 weeks from delivery and she is outside of that time period Discharge from MAU in stable condition Patient given the option of transfer to [facility] for further evaluation or seek care in outpatient facility of choice  [facility] on [facility] hours of service given List of options for follow-up given  Warning signs for worsening condition that would  warrant emergency follow-up discussed Patient may return to MAU as needed for pregnancy issues  Raelyn Mora, CNM 12/27/2022 9:52 AM

## 2022-12-27 NOTE — ED Provider Notes (Signed)
[place] EMERGENCY DEPARTMENT AT [facility] Provider Note   CSN: [id number] Arrival date & time: 12/27/22  1013     History  Chief Complaint  Patient presents with   Abdominal Pain    Zoe Armstrong is a 32 y.o. female.  HPI    32 year old female comes in with chief complaint of abdominal pain.  Patient is about 8 weeks postpartum, status post C-section.  She states that over the last 2 weeks she has noted some vaginal discharge that has changed in color and is malodorous.  Yesterday she started having abdominal pain.  Abdominal pain is in the lower abdominal region.  Today the pain was more severe, therefore she decided to come to the emergency room.  Patient volunteers abdomen being driven to the ER, they are hitting bumps her pain will get worse.  Review of system is negative for any vomiting, fevers, chills.  Patient has had some nausea.  She denies any UTI-like symptoms.  No other abdominal surgical history.  Home Medications Prior to Admission medications   Medication Sig Start Date End Date Taking? Authorizing Provider  cyclobenzaprine (FLEXERIL) 10 MG tablet Take 1 tablet (10 mg total) by mouth 2 (two) times daily as needed for muscle spasms. Patient not taking: Reported on 10/22/2022 07/23/22   Brand Males, CNM  ibuprofen (ADVIL) 600 MG tablet Take 1 tablet (600 mg total) by mouth every 6 (six) hours as needed for moderate pain or cramping. 10/29/22   Banga, Sharol Given, DO  metroNIDAZOLE (FLAGYL) 500 MG tablet Take 1 tablet (500 mg total) by mouth 2 (two) times daily. Patient not taking: Reported on 10/22/2022 07/16/22   Calvert Cantor, CNM  Prenatal Vit-Fe Fumarate-FA (PRENATAL MULTIVITAMIN) TABS tablet Take 1 tablet by mouth in the morning. Ritual Prenatal Vitamin Folate & Choline for Neural Tube Support    [provider]  promethazine (PHENERGAN) 25 MG tablet Take 1 tablet (25 mg total) by mouth every 6 (six) hours as needed for nausea or  vomiting. Patient not taking: Reported on 10/22/2022 07/23/22   Brand Males, CNM  tamsulosin (FLOMAX) 0.4 MG CAPS capsule Take 1 capsule (0.4 mg total) by mouth daily. Patient not taking: Reported on 10/22/2022 07/23/22   Brand Males, CNM      Allergies    Patient has no known allergies.    Review of Systems   Review of Systems  All other systems reviewed and are negative.   Physical Exam Updated Vital Signs BP 118/77   Pulse 99   Temp 98.5 F (36.9 C) (Oral)   Resp 20   SpO2 100%  Physical Exam Vitals and nursing note reviewed.  Constitutional:      Appearance: She is well-developed.  HENT:     Head: Normocephalic and atraumatic.  Eyes:     Conjunctiva/sclera: Conjunctivae normal.     Pupils: Pupils are equal, round, and reactive to light.  Cardiovascular:     Rate and Rhythm: Normal rate and regular rhythm.  Pulmonary:     Effort: Pulmonary effort is normal. No respiratory distress.     Breath sounds: No wheezing.  Abdominal:     General: Bowel sounds are normal. There is no distension.     Palpations: Abdomen is soft.     Tenderness: There is no abdominal tenderness. There is no guarding or rebound.  Genitourinary:    Vagina: Normal.     Comments: External exam - normal, no lesions Speculum exam: Pt has copious  purulent appearing discharge Bimanual exam: Patient has no CMT, no adnexal tenderness or fullness and cervical os appears closed Musculoskeletal:     Cervical back: Normal range of motion and neck supple.  Skin:    General: Skin is warm and dry.  Neurological:     Mental Status: She is alert and oriented to person, place, and time.     ED Results / Procedures / Treatments   Labs (all labs ordered are listed, but only abnormal results are displayed) Labs Reviewed  WET PREP, GENITAL - Abnormal; Notable for the following components:      Result Value   WBC, Wet Prep HPF POC >=10 (*)    All other components within normal limits  CBC WITH  DIFFERENTIAL/PLATELET - Abnormal; Notable for the following components:   RBC 5.14 (*)    Neutro Abs 7.9 (*)    All other components within normal limits  URINALYSIS, W/ REFLEX TO CULTURE (INFECTION SUSPECTED) - Abnormal; Notable for the following components:   Hgb urine dipstick MODERATE (*)    Leukocytes,Ua LARGE (*)    Bacteria, UA RARE (*)    All other components within normal limits  CULTURE, BLOOD (ROUTINE X 2)  CULTURE, BLOOD (ROUTINE X 2)  URINE CULTURE  LACTIC ACID, PLASMA  COMPREHENSIVE METABOLIC PANEL  PROTIME-INR  PREGNANCY, URINE  LACTIC ACID, PLASMA  GC/CHLAMYDIA PROBE AMP ([place]) NOT AT [facility]    EKG EKG Interpretation Date/Time:  Sunday December 27 2022 11:59:18 EDT Ventricular Rate:  102 PR Interval:  175 QRS Duration:  86 QT Interval:  339 QTC Calculation: 442 R Axis:   80  Text Interpretation: Sinus tachycardia No acute changes No significant change since last tracing Confirmed by Derwood Kaplan [phone]) on 12/27/2022 12:08:49 PM  Radiology CT ABDOMEN PELVIS W CONTRAST  Result Date: 12/27/2022 CLINICAL DATA:  Pain, 6 weeks postpartum EXAM: CT ABDOMEN AND PELVIS WITH CONTRAST TECHNIQUE: Multidetector CT imaging of the abdomen and pelvis was performed using the standard protocol following bolus administration of intravenous contrast. RADIATION DOSE REDUCTION: This exam was performed according to the departmental dose-optimization program which includes automated exposure control, adjustment of the mA and/or kV according to patient size and/or use of iterative reconstruction technique. CONTRAST:  80mL OMNIPAQUE IOHEXOL 300 MG/ML  SOLN COMPARISON:  07/23/2022 FINDINGS: Lower chest: Visualized lower lung fields are clear. Hepatobiliary: There is a 9 mm low-density in the right lobe of liver, possibly cysts or hemangioma. There is no dilation of bile ducts. Gallbladder is unremarkable. Pancreas: No focal abnormalities are seen. Spleen: Spleen measures 13.5 cm in maximum  diameter. Adrenals/Urinary Tract: Adrenals are unremarkable. There is interval clearing of left hydronephrosis. There is slight prominence of right renal pelvis without significant dilation of minor calices. There are few bilateral renal stones measuring up to 4 mm in maximum diameter. There are no calcific densities in the courses of ureters. There is interval passage of left ureteral calculus seen in the previous study. Urinary bladder is distended. Stomach/Bowel: Stomach is not distended. Small bowel loops are not dilated. Appendix is not dilated. There is no significant wall thickening in colon. There is no pericolic stranding. Vascular/Lymphatic: Vascular structures are unremarkable. Reproductive: Uterus is enlarged measuring 12.8 x 8.8 x 5 cm. There is fluid density in endometrial cavity and cervical canal. Small amount of fluid is seen in the vaginal canal. Other: There is no ascites or pneumoperitoneum. There is diastasis between the rectus muscles at the level of umbilicus. Musculoskeletal: No acute findings  are seen. IMPRESSION: There is no evidence of intestinal obstruction or pneumoperitoneum. There is slight prominence of right renal pelvis without significant hydronephrosis. Appendix is not dilated. There are few bilateral renal stones. There is 9 mm low-density in the right lobe of liver suggesting cyst or hemangioma. Spleen is enlarged. There is fluid density in endocervical cavity and cervical canal. There is small amount of fluid in vaginal canal. Follow-up pelvic sonogram may be considered. Electronically Signed   By: Ernie Avena M.D.   On: 12/27/2022 13:32   DG Chest Port 1 View  Result Date: 12/27/2022 CLINICAL DATA:  Possible sepsis EXAM: PORTABLE CHEST 1 VIEW COMPARISON:  None Available. FINDINGS: The heart size and mediastinal contours are within normal limits. Both lungs are clear. The visualized skeletal structures are unremarkable. IMPRESSION: No active disease. Electronically  Signed   By: Ernie Avena M.D.   On: 12/27/2022 12:52    Procedures Procedures    Medications Ordered in ED Medications  lactated ringers infusion ( Intravenous New Bag/Given 12/27/22 1330)  morphine (PF) 4 MG/ML injection 4 mg (has no administration in time range)  lactated ringers bolus 1,000 mL (0 mLs Intravenous Stopped 12/27/22 1350)  morphine (PF) 4 MG/ML injection 4 mg (4 mg Intravenous Given 12/27/22 1250)  iohexol (OMNIPAQUE) 300 MG/ML solution 100 mL (80 mLs Intravenous Contrast Given 12/27/22 1304)  Ampicillin-Sulbactam (UNASYN) 3 g in sodium chloride 0.9 % 100 mL IVPB (3 g Intravenous New Bag/Given 12/27/22 1523)  doxycycline (VIBRA-TABS) tablet 100 mg (100 mg Oral Given 12/27/22 1520)    ED Course/ Medical Decision Making/ A&P Clinical Course as of 12/27/22 1558  Sun Dec 27, 2022  1302 CT still not completed. Labs are reassuring. Will cancel code sepsis. [AN]    Clinical Course User Index [AN] Derwood Kaplan, MD                             Medical Decision Making Amount and/or Complexity of Data Reviewed Labs: ordered. Radiology: ordered. ECG/medicine tests: ordered.  Risk Prescription drug management.  32 year old female comes in with chief complaint of abdominal pain and vaginal discharge.  I reviewed patient's outpatient records including once with OB/GYN.  On exam patient has peritonitis.  She does not however have any SIRS criteria besides tachycardia.  She does not appear toxic.  Differential diagnosis for her includes perforated viscus, intra-abdominal abscess, intra-abdominal bleeding/hematoma, cervicitis, infection of retained parts of conception, PID  Basic labs ordered along with CT scan of the abdomen pelvis.  Reassessment: CT scan indicates some endocervical fluid, otherwise it is reassuring.  Pelvic exam completed, there is purulent drainage.  I consulted Dr. Chestine Spore, OB/GYN.  Plan is to get ultrasound to rule out TOA. If the workup is  reassuring, then patient can be discharged with doxycycline and they will follow-up in the clinic this week.  If there are any concerning findings such as TOA or complex fluid/echogenicity in the uterus then we can reach back with Dr. Chestine Spore.  Dr. Rush Landmark will take over. Patient made aware of the plan.   Final Clinical Impression(s) / ED Diagnoses Final diagnoses:  Cervicitis and endocervicitis    Rx / DC Orders ED Discharge Orders     None         Derwood Kaplan, MD 12/27/22 1600

## 2022-12-27 NOTE — Discharge Instructions (Signed)
Your history, exam, and evaluation are consistent with infection in the cervix and uterus.  The OB/GYN team felt you are appropriate for outpatient antibiotic management and to get seen in clinic in the next couple days.  Please use the pain and nausea medicine to help with symptoms and rest and stay hydrated.  Please take the antibiotics as directed.  If any symptoms are to change or worsen acutely and symptoms are not improving, please return to the nearest emergency department.

## 2022-12-28 LAB — CULTURE, BLOOD (ROUTINE X 2)

## 2022-12-29 LAB — CULTURE, BLOOD (ROUTINE X 2)

## 2022-12-29 LAB — GC/CHLAMYDIA PROBE AMP (~~LOC~~) NOT AT ARMC
Chlamydia: NEGATIVE
Comment: NEGATIVE
Comment: NORMAL
Neisseria Gonorrhea: NEGATIVE

## 2022-12-29 LAB — URINE CULTURE: Culture: 30000 — AB

## 2022-12-30 LAB — URINE CULTURE

## 2022-12-30 LAB — CULTURE, BLOOD (ROUTINE X 2)
Culture: NO GROWTH
Culture: NO GROWTH

## 2022-12-31 ENCOUNTER — Telehealth (HOSPITAL_BASED_OUTPATIENT_CLINIC_OR_DEPARTMENT_OTHER): Payer: Self-pay | Admitting: Emergency Medicine

## 2022-12-31 NOTE — Telephone Encounter (Signed)
Post ED Visit - Positive Culture Follow-up  Culture report reviewed by antimicrobial stewardship pharmacist: [facility] []  Enzo Bi, Pharm.D. []  Celedonio Miyamoto, Pharm.D., BCPS AQ-ID []  Garvin Fila, Pharm.D., BCPS []  Georgina Pillion, Pharm.D., BCPS []  [place], [address].D., BCPS, AAHIVP []  Estella Husk, Pharm.D., BCPS, AAHIVP []  Lysle Pearl, PharmD, BCPS []  Phillips Climes, PharmD, BCPS []  Agapito Games, PharmD, BCPS [x]  Daylene Posey, PharmD []  Mervyn Gay, PharmD, BCPS []  Vinnie Level, PharmD  [facility] []  Len Childs, PharmD []  Greer Pickerel, PharmD []  Adalberto Cole, PharmD []  Perlie Gold, Rph []  Lonell Face) Jean Rosenthal, PharmD []  Earl Many, PharmD []  Junita Push, PharmD []  Dorna Leitz, PharmD []  Terrilee Files, PharmD []  Lynann Beaver, PharmD []  Keturah Barre, PharmD []  Loralee Pacas, PharmD []  Bernadene Person, PharmD   Positive urine culture Treated with Doxycycline, no further patient follow-up is required at this time.  Zoe Armstrong 12/31/2022, 2:32 PM

## 2023-01-01 LAB — CULTURE, BLOOD (ROUTINE X 2)
Special Requests: ADEQUATE
Special Requests: ADEQUATE

## 2023-05-03 ENCOUNTER — Ambulatory Visit (INDEPENDENT_AMBULATORY_CARE_PROVIDER_SITE_OTHER): Payer: BC Managed Care – PPO

## 2023-05-03 ENCOUNTER — Ambulatory Visit (INDEPENDENT_AMBULATORY_CARE_PROVIDER_SITE_OTHER): Payer: BC Managed Care – PPO | Admitting: Podiatry

## 2023-05-03 ENCOUNTER — Encounter: Payer: Self-pay | Admitting: Podiatry

## 2023-05-03 DIAGNOSIS — M79672 Pain in left foot: Secondary | ICD-10-CM

## 2023-05-03 DIAGNOSIS — M76822 Posterior tibial tendinitis, left leg: Secondary | ICD-10-CM | POA: Diagnosis not present

## 2023-05-03 MED ORDER — MELOXICAM 15 MG PO TABS: 15.0000 mg | ORAL_TABLET | Freq: Every day | ORAL | 0 refills | Status: DC

## 2023-05-03 NOTE — Patient Instructions (Signed)
Posterior Tibial Tendinitis Rehab Ask your health care provider which exercises are safe for you. Do exercises exactly as told by your provider and adjust them as directed. It's normal to feel mild stretching, pulling, tightness, or discomfort as you do these exercises. Stop right away if you feel sudden pain or your pain gets worse. Do not begin these exercises until told by your provider. Stretching and range-of-motion exercises These exercises warm up your muscles and joints and improve the movement and flexibility in your ankle and foot. These exercises may also help to relieve pain. Standing wall calf stretch, knee straight  Stand with your hands against a wall. Extend your left / right leg behind you, and bend your front knee slightly. If told, place a folded washcloth under the arch of your foot for support. Point the toes of your back foot slightly inward. Keeping your heels on the floor and your back knee straight, shift your weight toward the wall. Do not allow your back to arch. You should feel a gentle stretch in your upper left / right calf. Hold this position for __________ seconds. Repeat __________ times. Complete this exercise __________ times a day. Standing wall calf stretch, knee bent  Stand with your hands against a wall. Extend your left / right leg behind you, and bend your front knee slightly. If told, place a folded washcloth under the arch of your foot for support. Point the toes of your back foot slightly inward. Unlock your back knee so it's bent. Keep your heels on the floor. You should feel a gentle stretch deep in your lower left / right calf. Hold this position for __________ seconds. Repeat __________ times. Complete this exercise __________ times a day. Strengthening exercises These exercises build strength and endurance in your ankle and foot. Endurance is the ability to use your muscles for a long time, even after they get tired. Ankle inversion with  band  Secure one end of a rubber exercise band or tubing to a fixed object, such as a table leg or a pole, that will stay still when the band is pulled. Loop the other end of the band around the middle of your left / right foot. Sit on the floor facing the object with your left / right leg extended. The band or tube should be slightly tense when your foot is relaxed. Leading with your big toe, slowly bring your left / right foot and ankle inward, toward your other foot (inversion). Hold this position for __________ seconds. Slowly return your foot to the starting position. Repeat __________ times. Complete this exercise __________ times a day. Towel curls  Sit in a chair on a non-carpeted surface, and put your feet on the floor. Place a towel in front of your feet. If told by your provider, add a __________ weight to the end of the towel. Keeping your heel on the floor, put your left / right foot on the towel. Pull the towel toward you by grabbing the towel with your toes and curling them under. Keep your heel on the floor while you do this. Let your toes relax. Grab the towel with your toes again. Keep going until the towel is completely underneath your foot. Repeat __________ times. Complete this exercise __________ times a day. Balance exercise This exercise improves or maintains your balance. Balance is important in preventing falls. Single leg stand  Without wearing shoes, stand near a railing or in a doorway. You may hold on to the railing or   doorframe as needed for balance. Stand on your left / right foot. Keep your big toe down on the floor and try to keep your arch lifted. If balancing in this position is too easy, try the exercise with your eyes closed or while standing on a pillow. Hold this position for __________ seconds. Repeat __________ times. Complete this exercise __________ times a day. This information is not intended to replace advice given to you by your health care  provider. Make sure you discuss any questions you have with your health care provider. Document Revised: 06/17/2022 Document Reviewed: 06/17/2022 Elsevier Patient Education  2024 Elsevier Inc.  

## 2023-05-03 NOTE — Progress Notes (Signed)
  Subjective:  Patient ID: Zoe Armstrong, female    DOB: 03/09/1991,   MRN: [id number]  No chief complaint on file.   32 y.o. female presents for concern of left foot pain that has been ongoing for several weeks. She recently had a baby and since then her feet have been hurting more. Has always had flat feet but relates first steps in the morning have been very painful for her. She denies any treatments currently  . Denies any other pedal complaints. Denies n/v/f/c.   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 Debroah Loop, no beta hcg drawn-->complete mole on pathology 7/7: Beta hcg 3,526 7/15: 498 7/22: 144 7/29: 56 8/12: 15 8/20: 8 8/26: 5 9/2: 3 9/9: 2 9/23: <1 10/25: <1 11/24: <1   Encounter for menstrual regulation 12/24/2015   Fatigue 12/24/2015   Headache    migraines - 12/26/19- "have not had one in a while"   History of kidney stones    passed   Menstrual extraction 04/10/2013   UTI (urinary tract infection)    frequent with pregnancy   Weight gain 12/24/2015    Objective:  Physical Exam: Vascular: DP/PT pulses 2/4 bilateral. CFT <3 seconds. Normal hair growth on digits. No edema.  Skin. No lacerations or abrasions bilateral feet.  Musculoskeletal: MMT 5/5 bilateral lower extremities in DF, PF, Inversion and Eversion. Deceased ROM in DF of ankle joint. Tender along PT tendon to insertion site. No pain along achilles. No pain with calcaneal tubercle. Pain with inversion and PF and unable to perform single limb heel rise.  Neurological: Sensation intact to light touch.   Assessment:   1. Posterior tibial tendon dysfunction (PTTD) of left lower extremity      Plan:  Patient was evaluated and treated and all questions answered. -Xrays  reviewed. Medial collapse of the medial arch. Increased talar head un coverage. Pes planus noted.  X-rays reviewed and discussed with patient. Discussed PTTD diagnosis and treatment options with patient. Stretching exercises discussed and handout dispensed. Prescription for meloxicam provided. . Most recent kidney function wnl.  Tri-Lock ankle brace dispensed. Discussed if there is no improvement PT/MRI/injection may be an option. Patient to return to clinic in 6 to 8 weeks or sooner if symptoms fail to improve or worsen.    Louann Sjogren, DPM

## 2023-06-14 ENCOUNTER — Ambulatory Visit (INDEPENDENT_AMBULATORY_CARE_PROVIDER_SITE_OTHER): Payer: BC Managed Care – PPO | Admitting: Podiatry

## 2023-06-14 DIAGNOSIS — M76822 Posterior tibial tendinitis, left leg: Secondary | ICD-10-CM

## 2023-06-14 MED ORDER — DEXAMETHASONE SODIUM PHOSPHATE 120 MG/30ML IJ SOLN
4.0000 mg | Freq: Once | INTRAMUSCULAR | Status: AC
Start: 2023-06-14 — End: 2023-06-14
  Administered 2023-06-14: 4 mg via INTRA_ARTICULAR

## 2023-06-14 MED ORDER — TRIAMCINOLONE ACETONIDE 10 MG/ML IJ SUSP
2.5000 mg | Freq: Once | INTRAMUSCULAR | Status: AC
Start: 2023-06-14 — End: 2023-06-14
  Administered 2023-06-14: 2.5 mg via INTRA_ARTICULAR

## 2023-06-14 NOTE — Progress Notes (Signed)
  Subjective:  Patient ID: Zoe Armstrong, female    DOB: 1990/12/04,   MRN: [id number]  No chief complaint on file.   32 y.o. female presents for follow-up of left PTTD. Relates still having the same pain and has not had much improvement. Relates she has been stretching and wearing brace. Meloxicam has not helped.    . Denies any other pedal complaints. Denies n/v/f/c.   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 Debroah Loop, no beta hcg drawn-->complete mole on pathology 7/7: Beta hcg 3,526 7/15: 498 7/22: 144 7/29: 56 8/12: 15 8/20: 8 8/26: 5 9/2: 3 9/9: 2 9/23: <1 10/25: <1 11/24: <1   Encounter for menstrual regulation 12/24/2015   Fatigue 12/24/2015   Headache    migraines - 12/26/19- "have not had one in a while"   History of kidney stones    passed   Menstrual extraction 04/10/2013   UTI (urinary tract infection)    frequent with pregnancy   Weight gain 12/24/2015    Objective:  Physical Exam: Vascular: DP/PT pulses 2/4 bilateral. CFT <3 seconds. Normal hair growth on digits. No edema.  Skin. No lacerations or abrasions bilateral feet.  Musculoskeletal: MMT 5/5 bilateral lower extremities in DF, PF, Inversion and Eversion. Deceased ROM in DF of ankle joint. Tender along PT tendon to insertion site. No pain along achilles. No pain with calcaneal tubercle. Pain with inversion and PF and unable to perform single limb heel rise.  Neurological: Sensation intact to light touch.   Assessment:   1. Posterior tibial tendon dysfunction (PTTD) of left lower extremity       Plan:  Patient was evaluated and treated and all questions answered. -Xrays reviewed. Medial collapse of the medial arch. Increased talar head un coverage. Pes planus noted.   X-rays reviewed and discussed with patient. Discussed PTTD diagnosis and treatment options with patient. Continue stretching brace and anti-infalmmatories.  Injection offered today. Procedure below.  Amb ref to PT.  Discussed if there is no improvement MRI may be an option. Patient to return to clinic in 6 to 8 weeks or sooner if symptoms fail to improve or worsen.   Procedure: Injection Tendon/Ligament Discussed alternatives, risks, complications and verbal consent was obtained.  Location: Left posterior tibial tendon insertion . Skin Prep: Alcohol. Injectate: 1cc 0.5% marcaine plain, 1 cc dexamethasone 0.5 cc kenalog  Disposition: Patient tolerated procedure well. Injection site dressed with a band-aid.  Post-injection care was discussed and return precautions discussed.     Louann Sjogren, DPM

## 2023-08-16 ENCOUNTER — Ambulatory Visit: Payer: 59 | Admitting: Podiatry

## 2023-08-16 ENCOUNTER — Encounter: Payer: Self-pay | Admitting: Podiatry

## 2023-08-16 DIAGNOSIS — M76822 Posterior tibial tendinitis, left leg: Secondary | ICD-10-CM | POA: Diagnosis not present

## 2023-08-16 MED ORDER — DEXAMETHASONE SODIUM PHOSPHATE 120 MG/30ML IJ SOLN
4.0000 mg | Freq: Once | INTRAMUSCULAR | Status: AC
Start: 2023-08-16 — End: 2023-08-16
  Administered 2023-08-16: 4 mg via INTRA_ARTICULAR

## 2023-08-16 MED ORDER — TRIAMCINOLONE ACETONIDE 10 MG/ML IJ SUSP
2.5000 mg | Freq: Once | INTRAMUSCULAR | Status: AC
Start: 2023-08-16 — End: 2023-08-16
  Administered 2023-08-16: 2.5 mg via INTRA_ARTICULAR

## 2023-08-16 NOTE — Progress Notes (Signed)
  Subjective:  Patient ID: Zoe Armstrong, female    DOB: 08-17-1990,   MRN: [id number]  Chief Complaint  Patient presents with   Foot Pain    Pt  presents for follow-up of left PTTD. States she still having pain and has not seen any change.    33 y.o. female presents for follow-up of left PTTD. Relates still having the same pain and has not had much improvement. Relates she has been stretching and wearing brace. Meloxicam has not helped. Injection did not make a difference. Never did follow-up with PT as life got chaotic and thought she would wait to see me    . Denies any other pedal complaints. Denies n/v/f/c.   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 Debroah Loop, no beta hcg drawn-->complete mole on pathology 7/7: Beta hcg 3,526 7/15: 498 7/22: 144 7/29: 56 8/12: 15 8/20: 8 8/26: 5 9/2: 3 9/9: 2 9/23: <1 10/25: <1 11/24: <1   Encounter for menstrual regulation 12/24/2015   Fatigue 12/24/2015   Headache    migraines - 12/26/19- "have not had one in a while"   History of kidney stones    passed   Menstrual extraction 04/10/2013   UTI (urinary tract infection)    frequent with pregnancy   Weight gain 12/24/2015    Objective:  Physical Exam: Vascular: DP/PT pulses 2/4 bilateral. CFT <3 seconds. Normal hair growth on digits. No edema.  Skin. No lacerations or abrasions bilateral feet.  Musculoskeletal: MMT 5/5 bilateral lower extremities in DF, PF, Inversion and Eversion. Deceased ROM in DF of ankle joint. Tender along PT tendon to insertion site. No pain along achilles. No pain with calcaneal tubercle. Pain with inversion and PF and unable to perform single limb heel rise.  Neurological: Sensation intact to light touch.   Assessment:   1.  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 -Xrays reviewed. Medial collapse of the medial arch. Increased talar head un coverage. Pes planus noted.  X-rays reviewed and discussed with patient. Discussed PTTD diagnosis and treatment options with patient. Continue stretching brace and anti-infalmmatories.  Injection offered today. Procedure below.  Amb ref to PT.  Discussed if there is no improvement MRI may be an option. Patient to return to clinic in 6 to 8 weeks or sooner if symptoms fail to improve or worsen.   Procedure: Injection Tendon/Ligament Discussed alternatives, risks, complications and verbal consent was obtained.  Location: Left posterior tibial tendon insertion . Skin Prep: Alcohol. Injectate: 1cc 0.5% marcaine plain, 1 cc dexamethasone 0.5 cc kenalog  Disposition: Patient tolerated procedure well. Injection site dressed with a band-aid.  Post-injection care was discussed and return precautions discussed.     Louann Sjogren, DPM

## 2023-08-24 ENCOUNTER — Encounter: Payer: Self-pay | Admitting: Podiatry

## 2023-09-27 ENCOUNTER — Telehealth: Payer: Self-pay | Admitting: Podiatry

## 2023-09-27 NOTE — Telephone Encounter (Signed)
 Pt requests the referral for PT be sent to a different office that is not outpatient rehab as it files her insurance as an outpatient and is too expensive. She prefers one that would charge her a copay instead.

## 2023-09-28 ENCOUNTER — Ambulatory Visit (HOSPITAL_COMMUNITY): Payer: Self-pay

## 2023-10-01 ENCOUNTER — Other Ambulatory Visit: Payer: Self-pay | Admitting: Podiatry

## 2023-10-01 DIAGNOSIS — M76822 Posterior tibial tendinitis, left leg: Secondary | ICD-10-CM

## 2023-10-18 ENCOUNTER — Ambulatory Visit (INDEPENDENT_AMBULATORY_CARE_PROVIDER_SITE_OTHER): Payer: 59 | Admitting: Podiatry

## 2023-10-18 DIAGNOSIS — Z91199 Patient's noncompliance with other medical treatment and regimen due to unspecified reason: Secondary | ICD-10-CM

## 2023-10-18 NOTE — Progress Notes (Signed)
 No show

## 2023-11-29 ENCOUNTER — Ambulatory Visit (INDEPENDENT_AMBULATORY_CARE_PROVIDER_SITE_OTHER): Admitting: Podiatry

## 2023-11-29 DIAGNOSIS — Z91199 Patient's noncompliance with other medical treatment and regimen due to unspecified reason: Secondary | ICD-10-CM

## 2023-11-29 NOTE — Progress Notes (Signed)
 Cancel 24 hours

## 2023-12-14 LAB — OB RESULTS CONSOLE HEPATITIS B SURFACE ANTIGEN: Hepatitis B Surface Ag: NEGATIVE

## 2023-12-14 LAB — OB RESULTS CONSOLE RUBELLA ANTIBODY, IGM: Rubella: IMMUNE

## 2023-12-14 LAB — OB RESULTS CONSOLE ANTIBODY SCREEN: Antibody Screen: NEGATIVE

## 2023-12-14 LAB — OB RESULTS CONSOLE HIV ANTIBODY (ROUTINE TESTING): HIV: NONREACTIVE

## 2023-12-14 LAB — OB RESULTS CONSOLE GC/CHLAMYDIA
Chlamydia: NEGATIVE
Neisseria Gonorrhea: NEGATIVE

## 2023-12-14 LAB — OB RESULTS CONSOLE RPR: RPR: NONREACTIVE

## 2023-12-14 LAB — HEPATITIS C ANTIBODY: HCV Ab: NEGATIVE

## 2024-06-08 NOTE — Patient Instructions (Addendum)
 Zoe Armstrong  06/08/2024   Your procedure is scheduled on:  12.18.2025  Arrive at 1000 at Entrance C on [address] at [facility]  and [employer]. You are invited to use the FREE valet parking or use the Visitor's parking deck.  Pick up the phone at the desk and dial [phone].  Call this number if you have problems the morning of surgery: [phone]  Remember:   Do not eat food:(After Midnight) Desps de medianoche.  You may drink clear liquids until  __0800___.  Clear liquids means a liquid you can see thru.  It can have color such as Cola or Kool aid.  Tea is OK and coffee as long as no milk or creamer of any kind.  Take these medicines the morning of surgery with A SIP OF WATER :  none   Do not wear jewelry, make-up or nail polish.  Do not wear lotions, powders, or perfumes. Do not wear deodorant.  Do not shave 48 hours prior to surgery.  Do not bring valuables to the hospital.  [facility] is not   responsible for any belongings or valuables brought to the hospital.  Contacts, dentures or bridgework may not be worn into surgery.  Leave suitcase in the car. After surgery it may be brought to your room.  For patients admitted to the hospital, checkout time is 11:00 AM the day of              discharge.      Please read over the following fact sheets that you were given:     Preparing for Surgery

## 2024-06-09 ENCOUNTER — Encounter (HOSPITAL_COMMUNITY): Payer: Self-pay

## 2024-06-09 ENCOUNTER — Telehealth (HOSPITAL_COMMUNITY): Payer: Self-pay | Admitting: *Deleted

## 2024-06-09 NOTE — Telephone Encounter (Signed)
 Preadmission screen

## 2024-06-12 ENCOUNTER — Encounter (HOSPITAL_COMMUNITY): Payer: Self-pay

## 2024-06-12 LAB — OB RESULTS CONSOLE GBS: GBS: NEGATIVE

## 2024-06-13 ENCOUNTER — Encounter (HOSPITAL_COMMUNITY): Payer: Self-pay

## 2024-06-13 NOTE — Patient Instructions (Signed)
 Kerith Sherley  06/13/2024   Your procedure is scheduled on:  12.18.2025  Arrive at 1000 at Entrance C on [address] at [facility]  and [employer]. You are invited to use the FREE valet parking or use the Visitor's parking deck.  Pick up the phone at the desk and dial [phone].  Call this number if you have problems the morning of surgery: [phone]  Remember:   Do not eat food:(After Midnight) Desps de medianoche.  You may drink clear liquids until  ___0800__.  Clear liquids means a liquid you can see thru.  It can have color such as Cola or Kool aid.  Tea is OK and coffee as long as no milk or creamer of any kind.  Take these medicines the morning of surgery with A SIP OF WATER :  none   Do not wear jewelry, make-up or nail polish.  Do not wear lotions, powders, or perfumes. Do not wear deodorant.  Do not shave 48 hours prior to surgery.  Do not bring valuables to the hospital.  [facility] is not   responsible for any belongings or valuables brought to the hospital.  Contacts, dentures or bridgework may not be worn into surgery.  Leave suitcase in the car. After surgery it may be brought to your room.  For patients admitted to the hospital, checkout time is 11:00 AM the day of              discharge.      Please read over the following fact sheets that you were given:     Preparing for Surgery

## 2024-06-14 ENCOUNTER — Encounter (HOSPITAL_COMMUNITY): Admission: RE | Admit: 2024-06-14 | Discharge: 2024-06-14 | Attending: Obstetrics and Gynecology

## 2024-06-14 ENCOUNTER — Inpatient Hospital Stay (HOSPITAL_COMMUNITY)
Admission: RE | Admit: 2024-06-14 | Discharge: 2024-06-14 | Disposition: A | Discharge status: Home / Self Care | Source: Ambulatory Visit

## 2024-06-14 DIAGNOSIS — Z8759 Personal history of other complications of pregnancy, childbirth and the puerperium: Secondary | ICD-10-CM

## 2024-06-14 DIAGNOSIS — Z01812 Encounter for preprocedural laboratory examination: Secondary | ICD-10-CM | POA: Insufficient documentation

## 2024-06-14 DIAGNOSIS — Z3A37 37 weeks gestation of pregnancy: Secondary | ICD-10-CM | POA: Insufficient documentation

## 2024-06-14 LAB — CBC
HCT: 35.6 % — ABNORMAL LOW (ref 36.0–46.0)
Hemoglobin: 11.9 g/dL — ABNORMAL LOW (ref 12.0–15.0)
MCH: 29.9 pg (ref 26.0–34.0)
MCHC: 33.4 g/dL (ref 30.0–36.0)
MCV: 89.4 fL (ref 80.0–100.0)
Platelets: 159 K/uL (ref 150–400)
RBC: 3.98 MIL/uL (ref 3.87–5.11)
RDW: 13.4 % (ref 11.5–15.5)
WBC: 7.1 K/uL (ref 4.0–10.5)
nRBC: 0 % (ref 0.0–0.2)

## 2024-06-14 LAB — TYPE AND SCREEN
ABO/RH(D): A POS
Antibody Screen: NEGATIVE

## 2024-06-15 ENCOUNTER — Inpatient Hospital Stay (HOSPITAL_COMMUNITY): Admitting: Anesthesiology

## 2024-06-15 ENCOUNTER — Encounter (HOSPITAL_COMMUNITY): Payer: Self-pay | Admitting: Obstetrics and Gynecology

## 2024-06-15 ENCOUNTER — Inpatient Hospital Stay (HOSPITAL_COMMUNITY)
Admission: RE | Admit: 2024-06-15 | Discharge: 2024-06-18 | DRG: 784 | Disposition: A | Discharge status: Home / Self Care | Payer: Self-pay | Attending: Obstetrics and Gynecology | Admitting: Obstetrics and Gynecology

## 2024-06-15 ENCOUNTER — Encounter (HOSPITAL_COMMUNITY): Admission: RE | Payer: Self-pay | Source: Home / Self Care

## 2024-06-15 ENCOUNTER — Other Ambulatory Visit: Payer: Self-pay

## 2024-06-15 DIAGNOSIS — Z3A37 37 weeks gestation of pregnancy: Secondary | ICD-10-CM

## 2024-06-15 DIAGNOSIS — Z833 Family history of diabetes mellitus: Secondary | ICD-10-CM | POA: Diagnosis not present

## 2024-06-15 DIAGNOSIS — K219 Gastro-esophageal reflux disease without esophagitis: Secondary | ICD-10-CM | POA: Diagnosis present

## 2024-06-15 DIAGNOSIS — O34211 Maternal care for low transverse scar from previous cesarean delivery: Secondary | ICD-10-CM | POA: Diagnosis present

## 2024-06-15 DIAGNOSIS — Z8759 Personal history of other complications of pregnancy, childbirth and the puerperium: Principal | ICD-10-CM

## 2024-06-15 DIAGNOSIS — Z8249 Family history of ischemic heart disease and other diseases of the circulatory system: Secondary | ICD-10-CM

## 2024-06-15 DIAGNOSIS — Z98891 History of uterine scar from previous surgery: Secondary | ICD-10-CM

## 2024-06-15 DIAGNOSIS — Z302 Encounter for sterilization: Secondary | ICD-10-CM

## 2024-06-15 DIAGNOSIS — O34219 Maternal care for unspecified type scar from previous cesarean delivery: Secondary | ICD-10-CM | POA: Diagnosis present

## 2024-06-15 DIAGNOSIS — O99214 Obesity complicating childbirth: Secondary | ICD-10-CM | POA: Diagnosis present

## 2024-06-15 DIAGNOSIS — D62 Acute posthemorrhagic anemia: Secondary | ICD-10-CM | POA: Diagnosis not present

## 2024-06-15 DIAGNOSIS — O403XX0 Polyhydramnios, third trimester, not applicable or unspecified: Secondary | ICD-10-CM

## 2024-06-15 DIAGNOSIS — O9081 Anemia of the puerperium: Secondary | ICD-10-CM | POA: Diagnosis not present

## 2024-06-15 DIAGNOSIS — O9962 Diseases of the digestive system complicating childbirth: Secondary | ICD-10-CM | POA: Diagnosis present

## 2024-06-15 LAB — SYPHILIS: RPR W/REFLEX TO RPR TITER AND TREPONEMAL ANTIBODIES, TRADITIONAL SCREENING AND DIAGNOSIS ALGORITHM: RPR Ser Ql: NONREACTIVE

## 2024-06-15 SURGERY — Surgical Case
Anesthesia: Spinal | Site: Abdomen

## 2024-06-15 MED ORDER — MORPHINE SULFATE (PF) 0.5 MG/ML IJ SOLN
INTRAMUSCULAR | Status: AC
  Filled 2024-06-15: qty 10

## 2024-06-15 MED ORDER — PHENYLEPHRINE HCL-NACL 20-0.9 MG/250ML-% IV SOLN
INTRAVENOUS | Status: DC | PRN
  Administered 2024-06-15: 13:00:00 60 ug/min via INTRAVENOUS

## 2024-06-15 MED ORDER — ACETAMINOPHEN 10 MG/ML IV SOLN
INTRAVENOUS | Status: AC
  Filled 2024-06-15: qty 100

## 2024-06-15 MED ORDER — CEFAZOLIN SODIUM-DEXTROSE 2-4 GM/100ML-% IV SOLN
2.0000 g | INTRAVENOUS | Status: AC
  Administered 2024-06-15: 13:00:00 2 g via INTRAVENOUS

## 2024-06-15 MED ORDER — KETOROLAC TROMETHAMINE 30 MG/ML IJ SOLN: 30.0000 mg | Freq: Four times a day (QID) | INTRAMUSCULAR | Status: DC | PRN

## 2024-06-15 MED ORDER — ONDANSETRON HCL 4 MG/2ML IJ SOLN
INTRAMUSCULAR | Status: DC | PRN
  Administered 2024-06-15: 13:00:00 4 mg via INTRAVENOUS

## 2024-06-15 MED ORDER — COCONUT OIL OIL
1.0000 | TOPICAL_OIL | Status: DC | PRN
  Administered 2024-06-15: 19:00:00 1 via TOPICAL

## 2024-06-15 MED ORDER — POVIDONE-IODINE 10 % EX SWAB
2.0000 | Freq: Once | CUTANEOUS | Status: AC
  Administered 2024-06-15: 10:00:00 2 via TOPICAL

## 2024-06-15 MED ORDER — NALOXONE HCL 0.4 MG/ML IJ SOLN: 0.4000 mg | INTRAMUSCULAR | Status: DC | PRN

## 2024-06-15 MED ORDER — ACETAMINOPHEN 500 MG PO TABS
1000.0000 mg | ORAL_TABLET | Freq: Four times a day (QID) | ORAL | Status: DC
  Administered 2024-06-15 – 2024-06-18 (×11): 1000 mg via ORAL
  Filled 2024-06-15 (×13): qty 2

## 2024-06-15 MED ORDER — DIBUCAINE (PERIANAL) 1 % EX OINT: 1.0000 | TOPICAL_OINTMENT | CUTANEOUS | Status: DC | PRN

## 2024-06-15 MED ORDER — MENTHOL 3 MG MT LOZG: 1.0000 | LOZENGE | OROMUCOSAL | Status: DC | PRN

## 2024-06-15 MED ORDER — ONDANSETRON HCL 4 MG/2ML IJ SOLN: 4.0000 mg | Freq: Three times a day (TID) | INTRAMUSCULAR | Status: DC | PRN

## 2024-06-15 MED ORDER — FENTANYL CITRATE (PF) 100 MCG/2ML IJ SOLN
INTRAMUSCULAR | Status: AC
  Filled 2024-06-15: qty 2

## 2024-06-15 MED ORDER — FENTANYL CITRATE (PF) 100 MCG/2ML IJ SOLN: 25.0000 ug | INTRAMUSCULAR | Status: DC | PRN

## 2024-06-15 MED ORDER — DIPHENHYDRAMINE HCL 25 MG PO CAPS
25.0000 mg | ORAL_CAPSULE | Freq: Four times a day (QID) | ORAL | Status: DC | PRN
  Administered 2024-06-15: 20:00:00 25 mg via ORAL

## 2024-06-15 MED ORDER — SENNOSIDES-DOCUSATE SODIUM 8.6-50 MG PO TABS
2.0000 | ORAL_TABLET | Freq: Every day | ORAL | Status: DC
  Administered 2024-06-16 – 2024-06-18 (×3): 2 via ORAL
  Filled 2024-06-15 (×3): qty 2

## 2024-06-15 MED ORDER — OXYTOCIN-SODIUM CHLORIDE 30-0.9 UT/500ML-% IV SOLN
INTRAVENOUS | Status: AC
  Filled 2024-06-15: qty 500

## 2024-06-15 MED ORDER — TRANEXAMIC ACID-NACL 1000-0.7 MG/100ML-% IV SOLN
INTRAVENOUS | Status: AC
  Filled 2024-06-15: qty 100

## 2024-06-15 MED ORDER — SODIUM CHLORIDE 0.9 % IR SOLN
Status: DC | PRN
  Administered 2024-06-15: 13:00:00 1

## 2024-06-15 MED ORDER — DIPHENHYDRAMINE HCL 25 MG PO CAPS
25.0000 mg | ORAL_CAPSULE | ORAL | Status: DC | PRN
  Filled 2024-06-15: qty 1

## 2024-06-15 MED ORDER — DROPERIDOL 2.5 MG/ML IJ SOLN: 0.6250 mg | Freq: Once | INTRAMUSCULAR | Status: DC | PRN

## 2024-06-15 MED ORDER — MORPHINE SULFATE (PF) 0.5 MG/ML IJ SOLN
INTRAMUSCULAR | Status: DC | PRN
  Administered 2024-06-15: 13:00:00 .15 mg via INTRATHECAL

## 2024-06-15 MED ORDER — SIMETHICONE 80 MG PO CHEW: 80.0000 mg | CHEWABLE_TABLET | ORAL | Status: DC | PRN

## 2024-06-15 MED ORDER — ZOLPIDEM TARTRATE 5 MG PO TABS: 5.0000 mg | ORAL_TABLET | Freq: Every evening | ORAL | Status: DC | PRN

## 2024-06-15 MED ORDER — WITCH HAZEL-GLYCERIN EX PADS: 1.0000 | MEDICATED_PAD | CUTANEOUS | Status: DC | PRN

## 2024-06-15 MED ORDER — STERILE WATER FOR IRRIGATION IR SOLN
Status: DC | PRN
  Administered 2024-06-15: 13:00:00 1

## 2024-06-15 MED ORDER — CEFAZOLIN SODIUM-DEXTROSE 2-4 GM/100ML-% IV SOLN
INTRAVENOUS | Status: AC
  Filled 2024-06-15: qty 100

## 2024-06-15 MED ORDER — PRENATAL MULTIVITAMIN CH
1.0000 | ORAL_TABLET | Freq: Every day | ORAL | Status: DC
  Administered 2024-06-16 – 2024-06-18 (×3): 1 via ORAL
  Filled 2024-06-15 (×3): qty 1

## 2024-06-15 MED ORDER — IBUPROFEN 600 MG PO TABS
600.0000 mg | ORAL_TABLET | Freq: Four times a day (QID) | ORAL | Status: DC
  Administered 2024-06-16 – 2024-06-18 (×8): 600 mg via ORAL
  Filled 2024-06-15 (×9): qty 1

## 2024-06-15 MED ORDER — OXYCODONE HCL 5 MG PO TABS: 5.0000 mg | ORAL_TABLET | ORAL | Status: DC | PRN

## 2024-06-15 MED ORDER — DEXAMETHASONE SOD PHOSPHATE PF 10 MG/ML IJ SOLN
INTRAMUSCULAR | Status: DC | PRN
  Administered 2024-06-15 (×2): 5 mg via INTRAVENOUS

## 2024-06-15 MED ORDER — BUPIVACAINE IN DEXTROSE 0.75-8.25 % IT SOLN
INTRATHECAL | Status: DC | PRN
  Administered 2024-06-15: 13:00:00 1.7 mL via INTRATHECAL

## 2024-06-15 MED ORDER — SIMETHICONE 80 MG PO CHEW
80.0000 mg | CHEWABLE_TABLET | Freq: Three times a day (TID) | ORAL | Status: DC
  Administered 2024-06-16 – 2024-06-18 (×7): 80 mg via ORAL
  Filled 2024-06-15 (×10): qty 1

## 2024-06-15 MED ORDER — SODIUM CHLORIDE 0.9% FLUSH: 3.0000 mL | INTRAVENOUS | Status: DC | PRN

## 2024-06-15 MED ORDER — KETOROLAC TROMETHAMINE 30 MG/ML IJ SOLN
30.0000 mg | Freq: Four times a day (QID) | INTRAMUSCULAR | Status: AC
  Administered 2024-06-15 – 2024-06-16 (×4): 30 mg via INTRAVENOUS
  Filled 2024-06-15 (×4): qty 1

## 2024-06-15 MED ORDER — OXYTOCIN-SODIUM CHLORIDE 30-0.9 UT/500ML-% IV SOLN: 2.5000 [IU]/h | INTRAVENOUS | Status: AC

## 2024-06-15 MED ORDER — DIPHENHYDRAMINE HCL 50 MG/ML IJ SOLN: 12.5000 mg | INTRAMUSCULAR | Status: DC | PRN

## 2024-06-15 MED ORDER — FENTANYL CITRATE (PF) 100 MCG/2ML IJ SOLN
INTRAMUSCULAR | Status: DC | PRN
  Administered 2024-06-15: 13:00:00 15 ug via INTRATHECAL

## 2024-06-15 MED ORDER — NALOXONE HCL 4 MG/10ML IJ SOLN: 1.0000 ug/kg/h | INTRAVENOUS | Status: DC | PRN

## 2024-06-15 MED ORDER — ACETAMINOPHEN 10 MG/ML IV SOLN
INTRAVENOUS | Status: DC | PRN
  Administered 2024-06-15: 14:00:00 1000 mg via INTRAVENOUS

## 2024-06-15 MED ADMIN — Tranexamic Acid-Sodium Chloride IV Soln 1000 MG/100ML-0.7%: 1000 mg | INTRAVENOUS | @ 13:00:00 | NDC 80830232902

## 2024-06-15 MED ADMIN — Oxytocin-Sodium Chloride 0.9% IV Soln 30 Unit/500ML: 999 mL/h | INTRAVENOUS | @ 13:00:00 | NDC 70092106807

## 2024-06-15 MED FILL — Ondansetron HCl Inj 4 MG/2ML (2 MG/ML): INTRAMUSCULAR | Qty: 2 | Status: AC

## 2024-06-15 MED FILL — Phenylephrine-NaCl IV Solution 20 MG/250ML-0.9%: INTRAVENOUS | Qty: 250 | Status: AC

## 2024-06-15 SURGICAL SUPPLY — 32 items
CHLORAPREP W/TINT 26ML (MISCELLANEOUS) ×2 IMPLANT
CLAMP UMBILICAL CORD (MISCELLANEOUS) ×1 IMPLANT
CLOTH BEACON ORANGE TIMEOUT ST (SAFETY) ×1 IMPLANT
DERMABOND ADVANCED .7 DNX12 (GAUZE/BANDAGES/DRESSINGS) ×1 IMPLANT
DISSECTOR SURG LIGASURE 21 (MISCELLANEOUS) IMPLANT
DRSG OPSITE POSTOP 4X10 (GAUZE/BANDAGES/DRESSINGS) ×1 IMPLANT
ELECTRODE REM PT RTRN 9FT ADLT (ELECTROSURGICAL) ×1 IMPLANT
EXTRACTOR VACUUM BELL STYLE (SUCTIONS) IMPLANT
GAUZE SPONGE 4X4 12PLY STRL LF (GAUZE/BANDAGES/DRESSINGS) IMPLANT
GLOVE BIO SURGEON STRL SZ 6.5 (GLOVE) ×1 IMPLANT
GLOVE BIOGEL PI IND STRL 6.5 (GLOVE) ×1 IMPLANT
GLOVE BIOGEL PI IND STRL 7.0 (GLOVE) ×2 IMPLANT
GOWN STRL REUS W/TWL LRG LVL3 (GOWN DISPOSABLE) ×2 IMPLANT
KIT ABG SYR 3ML LUER SLIP (SYRINGE) IMPLANT
MAT PREVALON FULL STRYKER (MISCELLANEOUS) IMPLANT
NDL HYPO 25X5/8 SAFETYGLIDE (NEEDLE) IMPLANT
NEEDLE HYPO 25X5/8 SAFETYGLIDE (NEEDLE) IMPLANT
NS IRRIG 1000ML POUR BTL (IV SOLUTION) ×1 IMPLANT
PACK C SECTION WH (CUSTOM PROCEDURE TRAY) ×1 IMPLANT
PAD ABD 8X10 STRL (GAUZE/BANDAGES/DRESSINGS) IMPLANT
PAD OB MATERNITY 4.3X12.25 (PERSONAL CARE ITEMS) ×1 IMPLANT
PENCIL SMOKE EVAC W/HOLSTER (ELECTROSURGICAL) ×1 IMPLANT
RTRCTR C-SECT PINK 25CM LRG (MISCELLANEOUS) IMPLANT
SUT PDS AB 0 CTX 36 PDP370T (SUTURE) IMPLANT
SUT PLAIN ABS 2-0 CT1 27XMFL (SUTURE) IMPLANT
SUT VIC AB 0 CT1 36 (SUTURE) ×2 IMPLANT
SUT VIC AB 2-0 CT1 TAPERPNT 27 (SUTURE) ×1 IMPLANT
SUT VIC AB 3-0 SH 27X BRD (SUTURE) IMPLANT
SUT VIC AB 4-0 KS 27 (SUTURE) ×1 IMPLANT
TOWEL OR 17X24 6PK STRL BLUE (TOWEL DISPOSABLE) ×1 IMPLANT
TRAY FOLEY W/BAG SLVR 14FR LF (SET/KITS/TRAYS/PACK) ×1 IMPLANT
WATER STERILE IRR 1000ML POUR (IV SOLUTION) ×1 IMPLANT

## 2024-06-15 NOTE — Transfer of Care (Signed)
 Immediate Anesthesia Transfer of Care Note  Patient: Stephane Pear  Procedure(s) Performed: CESAREAN SECTION, WITH BILATERAL TUBAL LIGATION (Abdomen)  Patient Location: PACU  Anesthesia Type:Spinal  Level of Consciousness: awake  Airway & Oxygen Therapy: Patient Spontanous Breathing  Post-op Assessment: Report given to RN and Post -op Vital signs reviewed and stable  Post vital signs: Reviewed and stable  Last Vitals:  Vitals Value Taken Time  BP 116/56 06/15/24 14:25  Temp    Pulse 77 06/15/24 14:26  Resp 19 06/15/24 14:26  SpO2 97 % 06/15/24 14:26  Vitals shown include unfiled device data.  Last Pain:  Vitals:   06/15/24 1023  TempSrc:   PainSc: 0-No pain         Complications: No notable events documented.

## 2024-06-15 NOTE — Anesthesia Procedure Notes (Signed)
 Spinal  Patient location during procedure: OR Start time: 06/15/2024 1:04 PM End time: 06/15/2024 1:06 PM Reason for block: surgical anesthesia  Staffing Performed: anesthesiologist  Authorized by: Jerrye Sharper, MD   Performed by: Jerrye Sharper, MD  Preanesthetic Checklist Completed: patient identified, IV checked, site marked, risks and benefits discussed, surgical consent, monitors and equipment checked, pre-op evaluation and timeout performed Spinal Block Patient position: sitting Prep: DuraPrep and site prepped and draped Patient monitoring: heart rate, cardiac monitor, continuous pulse ox and blood pressure Approach: midline Location: L3-4 Injection technique: single-shot Needle Needle type: Pencan  Needle gauge: 24 G Needle length: 9 cm Needle insertion depth (cm): 6 Assessment Sensory level: T4 Events: CSF return  Additional Notes Patient tolerated procedure well. Adequate sensory level.

## 2024-06-15 NOTE — H&P (Signed)
 Zoe Armstrong is a 33 y.o. female presenting for scheduled procedure. +FM, denies VB, LOF, cramping.   PNC c/b 1) History of section - with second child, elective, repeat planned 2) history of fourth degree - in G1, followed by elective as above 3) history of molar pregnancy - between first two children, beta trended appropriately, placenta to pathology 4) Family History of breast cancer - BIRADS 1 10/01/23 5) Polyhydramnios - diagnosed at 28wks with AFI 32cm, most recently 40cm at 39wks, weekly BPP 8/8  35wk GS: EFW 3356g/7lb6oz/88th%tile GBS neg OB History     Gravida  4   Para  2   Term  2   Preterm  0   AB  1   Living  2      SAB  0   IAB  0   Ectopic  0   Multiple  0   Live Births  2          Past Medical History:  Diagnosis Date   Acne    Anxiety 07/25/2014   12/26/19- not currently   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 Eveline, no beta hcg drawn-->complete mole on pathology 7/7: Beta hcg 3,526 7/15: 498 7/22: 144 7/29: 56 8/12: 15 8/20: 8 8/26: 5 9/2: 3 9/9: 2 9/23: <1 10/25: <1 11/24: <1   Encounter for menstrual regulation 12/24/2015   Fatigue 12/24/2015   Headache    migraines - 12/26/19- have not had one in a while   History of kidney stones    passed   Menstrual extraction 04/10/2013   UTI (urinary tract infection)    frequent with pregnancy   Weight gain 12/24/2015   Past Surgical History:  Procedure Laterality Date   CESAREAN SECTION N/A 10/26/2022   Procedure: CESAREAN SECTION;  Surgeon: Sudie Lavonia HERO, MD;  Location: [facility] ORS;  Service: Obstetrics;  Laterality: N/A;   DILATION AND EVACUATION N/A 12/27/2019   Procedure: DILATATION AND EVACUATION;  Surgeon: Eveline Lynwood MATSU, MD;  Location: [facility] OR;  Service: Gynecology;  Laterality: N/A;   NO PAST SURGERIES     OPERATIVE ULTRASOUND N/A  12/27/2019   Procedure: OPERATIVE ULTRASOUND;  Surgeon: Eveline Lynwood MATSU, MD;  Location: [facility] OR;  Service: Gynecology;  Laterality: N/A;   Family History: family history includes Alzheimer's disease in her maternal grandmother; Cancer in her maternal grandfather, maternal uncle, and mother; Diabetes in her paternal aunt; Heart attack in her paternal aunt and paternal grandmother; Heart disease in her paternal grandfather; Schizophrenia in her paternal grandmother; Thyroid disease in her maternal aunt and mother. Social History:  reports that she has never smoked. She has never used smokeless tobacco. She reports that she does not drink alcohol and does not use drugs.     Maternal Diabetes: No1hr 112 Genetic Screening: Normal female Maternal Ultrasounds/Referrals: Normal Fetal Ultrasounds or other Referrals:  None Maternal Substance Abuse:  No Significant Maternal Medications:  None Significant Maternal Lab Results:  Group B Strep negative Number of Prenatal Visits:greater than 3 verified prenatal visits Maternal Vaccinations:TDap Other Comments:  None  Review of Systems  Constitutional:  Negative for chills and fever.  Respiratory:  Negative for shortness of breath.   Cardiovascular:  Negative for chest pain, palpitations and leg swelling.  Gastrointestinal:  Negative for abdominal pain and vomiting.  Neurological:  Negative for dizziness, weakness and headaches.  Psychiatric/Behavioral:  Negative for suicidal ideas.    Maternal Medical History:  Contractions: Frequency: rare.   Fetal activity: Perceived fetal activity is normal.   Prenatal complications: Polyhydramnios.   Prenatal Complications - Diabetes: none.     Blood pressure 119/73, pulse 75, temperature 98 F (36.7 C), temperature source Oral, resp. rate 16, height 5' 3 (1.6 m), weight 87.5 kg, SpO2 99%, not currently breastfeeding. Exam Physical Exam Constitutional:      General: She is not in acute distress.     Appearance: She is well-developed.  HENT:     Head: Normocephalic and atraumatic.  Eyes:     Pupils: Pupils are equal, round, and reactive to light.  Cardiovascular:     Rate and Rhythm: Normal rate and regular rhythm.     Heart sounds: No murmur heard.    No gallop.  Abdominal:     Tenderness: There is no abdominal tenderness. There is no guarding or rebound.  Genitourinary:    Vagina: Normal.  Musculoskeletal:        General: Normal range of motion.     Cervical back: Normal range of motion and neck supple.  Skin:    General: Skin is warm and dry.  Neurological:     Mental Status: She is alert and oriented to person, place, and time.     Prenatal labs: ABO, Rh: --/--/A POS (12/17 1520) Antibody: NEG (12/17 1520) Rubella: Immune (06/17 0000) RPR: NON REACTIVE (12/17 1513)  HBsAg: Negative (06/17 0000)  HIV: Non-reactive (06/17 0000)  GBS: Negative/-- (12/15 0000)   Assessment/Plan: This is a 33yo [id number] @ 37 6/7 by LMP c/w 11wk TVUS with history of prior section in the setting of worsening severe polyhydramnios. Last AFI this week was 40cm, had been 27-32cm previously. Given worsening AFI, plan to move up section from 39wks. Antenatal testing has been otherwise reassuring. Satisfied fertility, aware of bilateral salpingectomy today. Plan for placenta to pathology given history of molar pregnancy.    Lavonia HERO [facility] 06/15/2024, 10:37 AM

## 2024-06-15 NOTE — Anesthesia Preprocedure Evaluation (Addendum)
 Anesthesia Evaluation  Patient identified by MRN, date of birth, ID band  Reviewed: Allergy & Precautions, NPO status , Patient's Chart, lab work & pertinent test results  Airway Mallampati: II  TM Distance: >3 FB Neck ROM: Full    Dental no notable dental hx. (+) Dental Advisory Given, Teeth Intact   Pulmonary neg pulmonary ROS   Pulmonary exam normal breath sounds clear to auscultation       Cardiovascular negative cardio ROS Normal cardiovascular exam Rhythm:Regular Rate:Normal     Neuro/Psych  Headaches PSYCHIATRIC DISORDERS Anxiety        GI/Hepatic Neg liver ROS,GERD  ,,  Endo/Other  Obesity   Renal/GU Hx/o nephrolithiasis Hx/o pyelonephritis  negative genitourinary   Musculoskeletal negative musculoskeletal ROS (+)    Abdominal  (+) + obese  Peds  Hematology  (+) Blood dyscrasia, anemia Lab Results      Component                Value               Date                      WBC                      7.1                 06/14/2024                HGB                      11.9 (L)            06/14/2024                HCT                      35.6 (L)            06/14/2024                MCV                      89.4                06/14/2024                PLT                      159                 06/14/2024              Anesthesia Other Findings   Reproductive/Obstetrics (+) Pregnancy Hx/o previous C/Section Hx/o Molar pregnancy Desires sterilization                              Anesthesia Physical Anesthesia Plan  ASA: 2  Anesthesia Plan: Spinal   Post-op Pain Management: Minimal or no pain anticipated   Induction: Intravenous  PONV Risk Score and Plan: 4 or greater and Treatment may vary due to age or medical condition  Airway Management Planned: Natural Airway  Additional Equipment: None and Fetal Monitoring  Intra-op Plan:   Post-operative Plan:   Informed  Consent: I have reviewed the patients History and Physical, chart, labs and discussed the procedure including the  risks, benefits and alternatives for the proposed anesthesia with the patient or authorized representative who has indicated his/her understanding and acceptance.     Dental advisory given  Plan Discussed with: Anesthesiologist and CRNA  Anesthesia Plan Comments:          Anesthesia Quick Evaluation

## 2024-06-15 NOTE — Anesthesia Postprocedure Evaluation (Signed)
 Anesthesia Post Note  Patient: Zoe Armstrong  Procedure(s) Performed: CESAREAN SECTION, WITH BILATERAL TUBAL LIGATION (Abdomen)     Patient location during evaluation: PACU Anesthesia Type: Spinal Level of consciousness: oriented and awake and alert Pain management: pain level controlled Vital Signs Assessment: post-procedure vital signs reviewed and stable Respiratory status: spontaneous breathing, respiratory function stable and nonlabored ventilation Cardiovascular status: blood pressure returned to baseline and stable Postop Assessment: no headache, no backache, no apparent nausea or vomiting and spinal receding Anesthetic complications: no   No notable events documented.  Last Vitals:  Vitals:   06/15/24 1500 06/15/24 1515  BP: 107/66 (!) 95/55  Pulse: 71 (!) 55  Resp: 16 14  Temp:    SpO2: 98% 96%    Last Pain:  Vitals:   06/15/24 1500  TempSrc:   PainSc: 0-No pain   Pain Goal:    LLE Motor Response: No movement due to regional block (06/15/24 1515) LLE Sensation: Tingling (06/15/24 1515) RLE Motor Response: Purposeful movement (06/15/24 1515) RLE Sensation: Tingling (06/15/24 1515)     Epidural/Spinal Function Cutaneous sensation: Tingles (06/15/24 1500), Patient able to flex knees: No (06/15/24 1500), Patient able to lift hips off bed: No (06/15/24 1500), Back pain beyond tenderness at insertion site: No (06/15/24 1500), Progressively worsening motor and/or sensory loss: No (06/15/24 1500), Bowel and/or bladder incontinence post epidural: No (06/15/24 1500)  Taneal Sonntag A.

## 2024-06-15 NOTE — Op Note (Signed)
 C-Section Operative Note  Date: 06/15/2024  Preoperative Diagnosis: IUP @ 37 6/7 Postoperative Diagnosis: same as above Surgeon: Lavonia Guppy, MD Assist: Luke Masters, MD Procedure: Repeat cesarean section with bilateral salpingectomy Indication: Worsening polyhydramnios at term, history of section and molar pregnancy, satisfied fertility Findings: Viable female infant pending weight with APGARS of 2, 4 and 6 at 1, 5 and 32m respectively. Normal appearing uterus, bilateral fallopian tubes and ovaries.  Specimens: placenta and bilateral fallopian tubes to pathology EBL 1149 IVF 2.5L UOP 750 Of note, 4.5L of amniotic fluid evacuated  Patient Course: Patient with history of section for prior fourth degree, desired repeat with BS. Diagnosed with poly at 28wks, worsening and severe this week at 40cm. Given term, delivery made for today at 37 6/7  Consent:  R/B/A of cesarean section discussed with patient. Alternative would be vaginal delivery which would mean shorter postpartum stay and decreased risk of bleeding. Risks of section include infection of the uterus, pelvic organs, or skin, inadvertent injury to internal organs, such as bowel or bladder. If there is major injury, extensive surgery may be required. If injury is minor, it may be treated with relative ease. Discussed possibility of excessive blood loss and transfusion. If bleeding cannot be controlled using medical or minor surgical methods, a cesarean hysterectomy may be performed which would mean no future fertility. Patient accepts the possibility of blood transfusion, if necessary. Patient understands and agrees to move forward with section.   Operative Procedure: Patient was taken to the operating room where epidural anesthesia was found to be adequate by Allis clamp test. She was prepped and draped in the normal sterile fashion in the dorsal supine position with a leftward tilt. An appropriate time out was performed. A Pfannenstiel  skin incision was then made with the scalpel and carried through to the underlying layer of fascia by sharp dissection and Bovie cautery. The fascia was nicked in the midline and the incision was extended laterally with Mayo scissors. The superior aspect of the incision was grasped Kocher clamps and dissected off the underlying rectus muscles. In a similar fashion the inferior aspect was dissected off the rectus muscles. Rectus muscles were separated in the midline and the peritoneal cavity entered bluntly. The peritoneal incision was then extended both superiorly and inferiorly with careful attention to avoid both bowel and bladder. The Alexis self-retaining wound retractor was then placed within the incision and the lower uterine segment exposed. The bladder flap was developed with Metzenbaum scissors and pushed away from the lower uterine segment. The lower uterine segment was then incised in a low transverse fashion and the cavity itself entered bluntly. The incision was extended bluntly. Amniotic sac was ruptured and fluid was noted to be clear and copious in color. Loose nuchal x1. The infant's head was then lifted and delivered from the incision without difficulty using the standard movements. The remainder of the infant delivered and the nose and mouth bulb suctioned with the cord clamped and cut as well. Excellent tone and cry initially however required CPAP while at the warmer. The infant was handed off to NICU. The placenta was then spontaneously expressed from the uterus and the uterus cleared of all clots and debris with moist lap sponge. As with the last delivery, multiple sinuses within LUS adding to EBL which was minimal after hysterotomy closure. The uterine incision was then repaired in 2 layers the first layer was a running locked layer of 0-vicryl and the second an imbricating layer of the  same suture. The tubes and ovaries were inspected and the gutters cleared of all clots and debris. The  uterine incision was inspected and found to be hemostatic.  Bilateral salpingectomy was carried out via open mini Ligasure by identifying fimbriated end with babcock clamps, tracing to cornua and transecting mesosalpinx. Identical procedures on both tubes, passed off of field to pathology.  All instruments and sponges as well as the Alexis retractor were then removed from the abdomen. The peritoneum was then reapproximated using 2-0 vicryl suture.  The fascia was then closed with 0 Vicryl in a running fashion. The skin was closed with a subcuticular stitch of 4-0 Vicryl on a Keith needle and then reinforced with Dermbaond and a Honeycomb. At the conclusion of the procedure all instruments and sponge counts were correct. Patient was taken to the recovery room in good condition. Baby to NICU for CPAP

## 2024-06-15 NOTE — Lactation Note (Signed)
 This note was copied from a baby's chart.  NICU Lactation Consultation Note  Patient Name: Zoe Armstrong Unijb'd Date: 06/15/2024 Age:34 hours  Reason for consult: Initial assessment; Early term 37-38.6wks; NICU baby  SUBJECTIVE Visited with family of 62 16/77 weeks old NICU female; baby Zoe Armstrong got admitted due to RDS. Zoe Armstrong is a P3 and experienced breastfeeding, her plan is to do both, breast and formula feeding. MBU RN Deane M set up a DEBP but pumping hasn't been initiated yet. Inquired about pumping to MOB and she voiced she's planning on it but hasn't had a chance to start due to visitors in the room, but that she will once they leave. They briefly stepped out to size her flanges. Reviewed pumping schedule, pumping log, secretory activation, lactogenesis III, CDC and anticipatory guidelines.  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) Does the patient have breastfeeding experience prior to this delivery?: Yes How long did the patient breastfeed?: 1st one for 7-8 months, 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 Pumping frequency: pump was set up at 3 hours post-partum Flange Size: 21 Risk factor for low/delayed milk supply:: infant separation  Pump: Manual, Hands Free, Personal (Elvie wearable)  ASSESSMENT Infant: Feeding Status: Scheduled 9-12-3-6 Feeding method: Tube/Gavage (Bolus)  Maternal: Breasts are soft, nipples everted and tissue  compressible  INTERVENTIONS/PLAN Interventions: Interventions: Breast feeding basics reviewed; Coconut oil; DEBP; Education; [employer] brochure; CDC milk storage guidelines; CDC Guidelines for Breast Pump Cleaning; NICU Pumping Log Tools: Pump; Flanges; Coconut oil Pump Education: Setup, frequency, and cleaning; Milk Storage  Plan: STS around care times Breast massage, hand expression and coconut oil prior to pumping Pump both breasts on initiate mode every 3 hours for 15 minutes; ideally 8 pumping sessions/24 hours  FOB and visitors present. All questions and concerns answered, family to contact [facility] services PRN.  Consult Status: NICU follow-up NICU Follow-up type: New admission follow up   Zoe Armstrong S Zoe Armstrong 06/15/2024, 6:06 PM

## 2024-06-16 LAB — CBC
HCT: 29.2 % — ABNORMAL LOW (ref 36.0–46.0)
Hemoglobin: 9.9 g/dL — ABNORMAL LOW (ref 12.0–15.0)
MCH: 29.8 pg (ref 26.0–34.0)
MCHC: 33.9 g/dL (ref 30.0–36.0)
MCV: 88 fL (ref 80.0–100.0)
Platelets: 167 K/uL (ref 150–400)
RBC: 3.32 MIL/uL — ABNORMAL LOW (ref 3.87–5.11)
RDW: 13.6 % (ref 11.5–15.5)
WBC: 11.6 K/uL — ABNORMAL HIGH (ref 4.0–10.5)
nRBC: 0 % (ref 0.0–0.2)

## 2024-06-16 NOTE — Progress Notes (Signed)

## 2024-06-16 NOTE — Progress Notes (Signed)
 Patient is doing well.  She is tolerating PO, ambulating.  Pain is controlled.  Lochia is appropriate. Foley catheter taken out at 0330 and she has not voided yet.    Vitals:   06/15/24 1933 06/15/24 2310 06/16/24 0314 06/16/24 0725  BP:  (!) 94/51 (!) 88/54 (!) 82/53  Pulse:  (!) 55 62 60  Resp: 17 18 16 16   Temp: 97.7 F (36.5 C) 98.2 F (36.8 C) 98.6 F (37 C) 98.1 F (36.7 C)  TempSrc: Oral Oral  Oral  SpO2: 100% 97% 98% 98%  Weight:      Height:        NAD Abdomen:  soft, appropriate tenderness, incisions intact and without erythema or drainage ext:    Symmetric, trace edema bilaterally  Lab Results  Component Value Date   WBC 11.6 (H) 06/16/2024   HGB 9.9 (L) 06/16/2024   HCT 29.2 (L) 06/16/2024   MCV 88.0 06/16/2024   PLT 167 06/16/2024    --/--/A POS (12/17 1520)  A/P    33 y.o. [id number] POD #1 s/p RCS and BS at [redacted]w[redacted]d for severe polyhydramnios Routine post op and postpartum care.   Infant in NICU on CPAP, plan circ when able Awaiting void.  Patient assisted to the bathroom.  If unable to void, will plan I&O cath now.  If able to void, still plan PVR given 8hrs of no voiding s/p foley catheter removal Anticipate discharge in 1-2 days

## 2024-06-16 NOTE — Lactation Note (Signed)
 This note was copied from a baby's chart.  NICU Lactation Consultation Note  Patient Name: Zoe Armstrong Unijb'd Date: 06/16/2024 Age:34 hours  Reason for consult: Follow-up assessment; Early term 76-38.6wks; NICU baby  SUBJECTIVE Visited with family of 36 1/79 weeks old NICU female [place]; Ms. Crabbe is a P3 and reported she's been pumping and getting small amounts of colostrum, enough to collect, praised her for all her efforts. Noticed that pumping hasn't been consistent due to her health status, she voiced she had to get a foley catheter because of difficulty urinating. She's willing to pump though, baby is currently on Similac 20 calorie formula in the meantime. She's expecting to get discharged from the [facility] possibly tomorrow. Reviewed discharge education, pump settings and the importance of consistent pumping for the onset of secretory activation and the prevention of engorgement.   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) Does the patient have breastfeeding experience prior to this delivery?: Yes How long did the patient breastfeed?: 1st one for 7-8 months, 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 Pumping frequency: initiated pumping at 7 hours post-partum; 3 times/24 hours Pumped volume: 1 mL Flange Size: 21 Risk factor for low/delayed milk supply:: PPH of 1241 cc, infant separation  Pump: Manual, Hands Free, Personal (Elvie  wearable)  ASSESSMENT Infant: Feeding Status: Scheduled 9-12-3-6 Feeding method: Tube/Gavage (Bolus)  Maternal: Milk volume: Normal  INTERVENTIONS/PLAN Interventions: Interventions: Breast feeding basics reviewed; Coconut oil; DEBP; Education Discharge Education: Engorgement and breast care Tools: Pump; Flanges; Coconut oil Pump Education: Setup, frequency, and cleaning; Milk Storage  Plan: STS around care times Pump both breasts on initiate mode every 3 hours for 15 minutes; ideally 8 pumping sessions/24 hours Switch to maintain mode once expressing + 20 ml of EBM combined Bring all pump pieces to baby's room after her discharge   No other support person at this time. All questions and concerns answered, family to contact [facility] services PRN.  Consult Status: NICU follow-up NICU Follow-up type: Verify onset of copious milk; Verify absence of engorgement; Maternal D/C visit   Migel Hannis GORMAN Crate 06/16/2024, 2:51 PM

## 2024-06-16 NOTE — Progress Notes (Signed)
 Foley catheter taken out at 0330. At 0900, this RN came in to follow up on urination status; MOB attempted but was unsuccessful. MOB went to NICU around 0930 and arrived back on the unit around 1100. During this time, RN monitored for arrival back on the unit. At 1105, RN went into room and discussed the next steps of bladder scanning and an in and out catheter if pt was unable to urinate. Pt was pumping and stated as soon as she got finished pumping she would get up to try and empty her bladder. This RN received a call from Dr. Gretta at 1120, stating to bladder scan and in and out cath pt even if she was able to void. Pt's urination attempt was unsuccessful. RN attempted in and out catheter. After 2 unsuccessful attempts with 2 different nurses, OB rapid response nurse was called. In and Out catheter was successful and of urine was retrieved. RN called Dr. Gretta at 1207 due to insufficent urine output and to see if any new orders of IV fluids were necessary. No new orders. Will continue PO fluids. Will continue to monitor and measure urine output.  Toinette Lackie, RN 06/16/2024 1:02 PM

## 2024-06-17 ENCOUNTER — Encounter (HOSPITAL_COMMUNITY): Payer: Self-pay | Admitting: Obstetrics and Gynecology

## 2024-06-17 NOTE — Progress Notes (Signed)
 Patient is doing well.  She is tolerating PO, ambulating.  Pain is controlled.  Lochia is appropriate. Voiding well, passing gas. Baby still in the NICU, now on nasal cannula. Denies lightheadedness. Aware that her Bps have been somewhat low, she believes associated with her not eating/drinking enough due to being in the NICU as much as possible.  Vitals:   06/16/24 0725 06/16/24 1326 06/16/24 1923 06/17/24 0548  BP: (!) 82/53 (!) 91/53 (!) 93/55 101/62  Pulse: 60 65 67 66  Resp: 16 16 18 18   Temp: 98.1 F (36.7 C) 98.1 F (36.7 C) 98 F (36.7 C) 97.7 F (36.5 C)  TempSrc: Oral Oral Oral Oral  SpO2: 98%  100% 98%  Weight:      Height:        NAD Abdomen:  soft, appropriate tenderness, incisions intact and without erythema or drainage ext:    Symmetric, trace edema bilaterally  Lab Results  Component Value Date   WBC 11.6 (H) 06/16/2024   HGB 9.9 (L) 06/16/2024   HCT 29.2 (L) 06/16/2024   MCV 88.0 06/16/2024   PLT 167 06/16/2024    --/--/A POS (12/17 1520)  A/P    33 y.o. [id number] POD #2 s/p RCS and BS at [redacted]w[redacted]d for severe polyhydramnios Routine post op and postpartum care.   Infant in NICU- plan circ when able Acute blood loss anemia, clinically significant for this hospitalization: Patient with PPH of 1200cc at delivery. Hgb 11.9-9.9; plan PO iron outpatient.  Mild hypotension: asx. Patient believes due to poor PO intake. Plan repeat CBC tomorrow AM.  Dispo: Anticipate discharge tomorrow.   Rubie Husky, MD

## 2024-06-18 LAB — CBC
HCT: 29.5 % — ABNORMAL LOW (ref 36.0–46.0)
Hemoglobin: 9.9 g/dL — ABNORMAL LOW (ref 12.0–15.0)
MCH: 29.6 pg (ref 26.0–34.0)
MCHC: 33.6 g/dL (ref 30.0–36.0)
MCV: 88.3 fL (ref 80.0–100.0)
Platelets: 159 K/uL (ref 150–400)
RBC: 3.34 MIL/uL — ABNORMAL LOW (ref 3.87–5.11)
RDW: 14 % (ref 11.5–15.5)
WBC: 7.2 K/uL (ref 4.0–10.5)
nRBC: 0 % (ref 0.0–0.2)

## 2024-06-18 MED ORDER — ACETAMINOPHEN 500 MG PO TABS: 500.0000 mg | ORAL_TABLET | Freq: Four times a day (QID) | ORAL | Status: AC

## 2024-06-18 MED ORDER — OXYCODONE HCL 5 MG PO TABS: 5.0000 mg | ORAL_TABLET | ORAL | 0 refills | Status: AC | PRN

## 2024-06-18 MED ORDER — IBUPROFEN 600 MG PO TABS: 600.0000 mg | ORAL_TABLET | Freq: Four times a day (QID) | ORAL | Status: AC

## 2024-06-18 NOTE — Lactation Note (Signed)
 This note was copied from a baby's chart.  NICU Lactation Consultation Note  Patient Name: Zoe Armstrong Unijb'd Date: 06/18/2024 Age:34 hours  Reason for consult: Follow-up assessment; Early term 18-38.6wks; NICU baby; Maternal discharge; Exclusive pumping and bottle feeding  SUBJECTIVE Visited with family of 71 57/71 weeks old NICU female [place]; Ms. Iannuzzi reported she's been trying to get on the 3-hour schedule but between all the back and forth between her room and baby's she's probably pumping every other feeding. She voiced her plan is to continue working on bottle feedings while in the NICU but that she might consider taking baby to breast once she goes home. He's on ad lib status.  She's getting discharged from the [facility] today. Reviewed discharge education, pump settings (already using maintain mode) and the importance of consistent pumping for the onset of secretory activation and the prevention of engorgement. Breastmilk storage guidelines were also revised since family lives 45 minutes away and it's flu season.   OBJECTIVE Infant data: Mother's Current Feeding Choice: Breast Milk and Formula  O2 Device: Room Air O2 Flow Rate (L/min): 2 L/min FiO2 (%): 21 %  Infant feeding assessment IDFS - Readiness: 2 IDFS - Quality: 2   Maternal data: [id number] C-Section, Low Transverse Pumping frequency: 4 times/24 hours Pumped volume: 20 mL (20-30 ml)  Pump: Manual, Hands Free, Personal (Elvie wearable)  ASSESSMENT Infant: Feeding Status: Ad lib Feeding method: Bottle Nipple Type: Dr. Jonna Preemie  Maternal: Milk volume: Normal  INTERVENTIONS/PLAN Interventions: Interventions: Breast feeding basics reviewed; Coconut oil; DEBP; Education Discharge Education: Engorgement and breast care  Plan: STS around care times Pump both breasts on maintain mode every 3 hours for 15-30 minutes; ideally 8 pumping sessions/24 hours Bring all pump pieces to baby's room after her  discharge Family will continue advancing on bottle feedings   No other support person at this time. All questions and concerns answered, family to contact [facility] services PRN.  Consult Status: NICU follow-up NICU Follow-up type: Verify absence of engorgement; Weekly NICU follow up   Tallie Hevia S Miriam 06/18/2024, 12:15 PM

## 2024-06-18 NOTE — Discharge Instructions (Signed)
 Zoe Armstrong

## 2024-06-18 NOTE — Progress Notes (Signed)
 Patient is doing well.  She is tolerating PO, ambulating.  Pain is controlled.  Lochia is appropriate. Voiding well, passing gas. Baby still in the NICU, now off of nasal cannula. Denies lightheadedness, but feels fatigued Vitals:   06/16/24 1923 06/17/24 0548 06/17/24 2330 06/18/24 0553  BP:  101/62 93/60 103/75  Pulse: 67 66 61 (!) 59  Resp: 18 18 16 16   Temp: 98 F (36.7 C) 97.7 F (36.5 C) 98.2 F (36.8 C) 98.1 F (36.7 C)  TempSrc: Oral Oral Oral Oral  SpO2: 100% 98% 97% 97%  Weight:      Height:        NAD Abdomen:  soft, appropriate tenderness, incision intact and without erythema or drainage ext:    Symmetric, trace edema bilaterally  Lab Results  Component Value Date   WBC 7.2 06/18/2024   HGB 9.9 (L) 06/18/2024   HCT 29.5 (L) 06/18/2024   MCV 88.3 06/18/2024   PLT 159 06/18/2024    --/--/A POS (12/17 1520)  A/P    33 y.o. [id number] POD #3 s/p RCS and BS at [redacted]w[redacted]d for severe polyhydramnios Routine post op and postpartum care.   Infant in NICU- plan circ when able Acute blood loss anemia, clinically significant for this hospitalization: Patient with PPH of 1200cc at delivery. Hgb 11.9-9.9; plan PO iron outpatient.  Mild hypotension: asx. Patient believes due to poor PO intake. Repeat Hgb stable at 9.9.  Dispo: Stable for discharge today.   Rubie Husky, MD

## 2024-06-18 NOTE — Discharge Summary (Signed)
 "    Postpartum Discharge Summary  Date of Service updated 12/18-12/21/2025     Patient Name: Zoe Armstrong DOB: 09/25/1990 MRN: [id number]  Date of admission: 06/15/2024 Delivery date:06/15/2024 Delivering provider: SUDIE LAVONIA HERO Date of discharge: 06/18/2024  Admitting diagnosis: History of cesarean section [Z98.891] Intrauterine pregnancy: [redacted]w[redacted]d     Secondary diagnosis:  Principal Problem:   History of cesarean section  Additional problems: history of molar pregnancy, polyhydramnios    Discharge diagnosis: Term Pregnancy Delivered, Anemia, and PPH                                              Post partum procedures:none Augmentation: N/A Complications: Hemorrhage>1046mL  Hospital course: Scheduled C/S   33 y.o. yo [id number] at [redacted]w[redacted]d was admitted to the hospital 06/15/2024 for scheduled cesarean section with the following indication:Elective Repeat and polyhydramnios.Delivery details are as follows:  Membrane Rupture Time/Date: 1:26 PM,06/15/2024  Delivery Method:C-Section, Low Transverse  Details of operation can be found in separate operative note.  Patient had a postpartum course complicated by acute blood loss anemia.  She is ambulating, tolerating a regular diet, passing flatus, and urinating well. Patient is discharged home in stable condition on  06/18/2024        Newborn Data: Birth date:06/15/2024 Birth time:1:27 PM Gender:Female Living status:Living Apgars:2 ,4  Weight:3880 g    Magnesium Sulfate received: No BMZ received: No Rhophylac:No MMR:No T-DaP:Given prenatally Flu: No RSV Vaccine received: No Transfusion:No Immunizations administered: Immunization History  Administered Date(s) Administered   Influenza,inj,Quad PF,6+ Mos 08/11/2018   Moderna Sars-Covid-2 Vaccination 02/06/2020, 03/05/2020   Tdap 01/23/2019    Physical exam  Vitals:   06/16/24 1923 06/17/24 0548 06/17/24 2330 06/18/24 0553  BP:  101/62 93/60 103/75  Pulse: 67 66 61 (!) 59   Resp: 18 18 16 16   Temp: 98 F (36.7 C) 97.7 F (36.5 C) 98.2 F (36.8 C) 98.1 F (36.7 C)  TempSrc: Oral Oral Oral Oral  SpO2: 100% 98% 97% 97%  Weight:      Height:       General: alert, cooperative, and no distress Lochia: appropriate Uterine Fundus: firm Incision: Healing well with no significant drainage DVT Evaluation: No evidence of DVT seen on physical exam. Labs: Lab Results  Component Value Date   WBC 7.2 06/18/2024   HGB 9.9 (L) 06/18/2024   HCT 29.5 (L) 06/18/2024   MCV 88.3 06/18/2024   PLT 159 06/18/2024      Latest Ref Rng & Units 12/27/2022   12:24 PM  CMP  Glucose 70 - 99 mg/dL 91   BUN 6 - 20 mg/dL 10   Creatinine 9.55 - 1.00 mg/dL 9.37   Sodium 864 - 854 mmol/L 139   Potassium 3.5 - 5.1 mmol/L 3.7   Chloride 98 - 111 mmol/L 103   CO2 22 - 32 mmol/L 27   Calcium 8.9 - 10.3 mg/dL 9.7   Total Protein 6.5 - 8.1 g/dL 8.0   Total Bilirubin 0.3 - 1.2 mg/dL 0.6   Alkaline Phos 38 - 126 U/L 62   AST 15 - 41 U/L 18   ALT 0 - 44 U/L 44    Edinburgh Score:    06/15/2024    4:15 PM  Edinburgh Postnatal Depression Scale Screening Tool  I have been able to laugh and see the funny side of things. 0  I have looked forward with enjoyment to things. 0  I have blamed myself unnecessarily when things went wrong. 2  I have been anxious or worried for no good reason. 2  I have felt scared or panicky for no good reason. 0  Things have been getting on top of me. 1  I have been so unhappy that I have had difficulty sleeping. 0  I have felt sad or miserable. 0  I have been so unhappy that I have been crying. 0  The thought of harming myself has occurred to me. 0  Edinburgh Postnatal Depression Scale Total 5      After visit meds:  Allergies as of 06/18/2024   No Known Allergies      Medication List     TAKE these medications    acetaminophen  500 MG tablet Commonly known as: TYLENOL  Take 1 tablet (500 mg total) by mouth every 6 (six) hours.    calcium carbonate 500 MG chewable tablet Commonly known as: TUMS - dosed in mg elemental calcium Chew 1 tablet by mouth as needed for indigestion or heartburn.   ibuprofen  600 MG tablet Commonly known as: ADVIL  Take 1 tablet (600 mg total) by mouth every 6 (six) hours.   oxyCODONE  5 MG immediate release tablet Commonly known as: Oxy IR/ROXICODONE  Take 1 tablet (5 mg total) by mouth every 4 (four) hours as needed for moderate pain (pain score 4-6).   prenatal multivitamin Tabs tablet Take 1 tablet by mouth in the morning. Ritual Prenatal Vitamin Folate & Choline for Neural Tube Support         Discharge home in stable condition Infant Feeding: Bottle and Breast Infant Disposition:NICU Discharge instruction: per After Visit Summary and Postpartum booklet. Activity: Advance as tolerated. Pelvic rest for 6 weeks.  Diet: routine diet Anticipated Birth Control: BS performed at the time of C-section Postpartum Appointment:4 weeks Additional Postpartum F/U: Incision check 4 weeks and routine postpartum Future Appointments:No future appointments. Follow up Visit:  Follow-up Information     [facility] Follow up in 4 week(s).   Contact information: [address]  [address] [place] [address] [phone]                     06/18/2024 Rubie DELENA Husky, MD   "

## 2024-06-19 LAB — SURGICAL PATHOLOGY

## 2024-06-23 ENCOUNTER — Encounter (HOSPITAL_COMMUNITY)
Admission: RE | Admit: 2024-06-23 | Discharge: 2024-06-23 | Disposition: A | Discharge status: Home / Self Care | Source: Ambulatory Visit | Attending: Obstetrics and Gynecology | Admitting: Obstetrics and Gynecology

## 2024-06-24 ENCOUNTER — Telehealth (HOSPITAL_COMMUNITY): Payer: Self-pay

## 2024-06-24 NOTE — Telephone Encounter (Signed)
 06/24/2024 1002  Name: Jadie Allington MRN: [id number] DOB: 1991/03/03  Reason for Call:  Transition of Care Hospital Discharge Call  Contact Status: Patient Contact Status: Message  Language assistant needed:          Follow-Up Questions:    Van Postnatal Depression Scale:  In the Past 7 Days:    PHQ2-9 Depression Scale:     Discharge Follow-up:    Post-discharge interventions: NA  Signature  Rosaline Deretha PEAK
